# Patient Record
Sex: Female | Born: 1945
Health system: Southern US, Community
[De-identification: ages and names within clinical notes are randomized; demographics above are authoritative.]

## PROBLEM LIST (undated history)

## (undated) DIAGNOSIS — G5600 Carpal tunnel syndrome, unspecified upper limb: Secondary | ICD-10-CM

## (undated) DIAGNOSIS — I10 Essential (primary) hypertension: Secondary | ICD-10-CM

## (undated) DIAGNOSIS — M1990 Unspecified osteoarthritis, unspecified site: Secondary | ICD-10-CM

## (undated) DIAGNOSIS — K5900 Constipation, unspecified: Secondary | ICD-10-CM

## (undated) DIAGNOSIS — Z9889 Other specified postprocedural states: Secondary | ICD-10-CM

## (undated) DIAGNOSIS — R87612 Low grade squamous intraepithelial lesion on cytologic smear of cervix (LGSIL): Secondary | ICD-10-CM

## (undated) DIAGNOSIS — R112 Nausea with vomiting, unspecified: Secondary | ICD-10-CM

## (undated) DIAGNOSIS — M549 Dorsalgia, unspecified: Secondary | ICD-10-CM

## (undated) HISTORY — PX: BUNIONECTOMY: SHX129

## (undated) HISTORY — PX: BREAST CYST EXCISION: SHX579

## (undated) HISTORY — DX: Low grade squamous intraepithelial lesion on cytologic smear of cervix (LGSIL): R87.612

## (undated) HISTORY — PX: COLONOSCOPY: SHX174

## (undated) HISTORY — DX: Unspecified osteoarthritis, unspecified site: M19.90

## (undated) HISTORY — DX: Dorsalgia, unspecified: M54.9

## (undated) HISTORY — DX: Essential (primary) hypertension: I10

## (undated) HISTORY — PX: CARPAL TUNNEL RELEASE: SHX101

## (undated) HISTORY — DX: Carpal tunnel syndrome, unspecified upper limb: G56.00

---

## 2002-11-11 ENCOUNTER — Ambulatory Visit (HOSPITAL_COMMUNITY): Admission: RE | Admit: 2002-11-11 | Discharge: 2002-11-11 | Payer: Self-pay

## 2003-05-07 ENCOUNTER — Ambulatory Visit (HOSPITAL_COMMUNITY): Admission: RE | Admit: 2003-05-07 | Discharge: 2003-05-07 | Payer: Self-pay | Admitting: Family Medicine

## 2003-06-10 ENCOUNTER — Other Ambulatory Visit: Admission: RE | Admit: 2003-06-10 | Discharge: 2003-06-10 | Payer: Self-pay

## 2004-05-25 ENCOUNTER — Ambulatory Visit (HOSPITAL_COMMUNITY): Admission: RE | Admit: 2004-05-25 | Discharge: 2004-05-25 | Payer: Self-pay | Admitting: Family Medicine

## 2004-05-30 HISTORY — PX: ABDOMINAL HYSTERECTOMY: SHX81

## 2004-09-08 ENCOUNTER — Inpatient Hospital Stay (HOSPITAL_COMMUNITY): Admission: RE | Admit: 2004-09-08 | Discharge: 2004-09-10 | Payer: Self-pay | Admitting: Obstetrics & Gynecology

## 2005-09-13 ENCOUNTER — Ambulatory Visit (HOSPITAL_COMMUNITY): Admission: RE | Admit: 2005-09-13 | Discharge: 2005-09-13 | Payer: Self-pay | Admitting: Family Medicine

## 2006-12-19 ENCOUNTER — Ambulatory Visit (HOSPITAL_COMMUNITY): Admission: RE | Admit: 2006-12-19 | Discharge: 2006-12-19 | Payer: Self-pay | Admitting: Family Medicine

## 2010-05-25 ENCOUNTER — Emergency Department (HOSPITAL_COMMUNITY)
Admission: EM | Admit: 2010-05-25 | Discharge: 2010-05-25 | Payer: Self-pay | Source: Home / Self Care | Admitting: Emergency Medicine

## 2010-06-19 ENCOUNTER — Encounter: Payer: Self-pay | Admitting: Family Medicine

## 2010-08-09 LAB — URINE MICROSCOPIC-ADD ON

## 2010-08-09 LAB — URINALYSIS, ROUTINE W REFLEX MICROSCOPIC
Bilirubin Urine: NEGATIVE
Glucose, UA: NEGATIVE mg/dL
Ketones, ur: NEGATIVE mg/dL
Leukocytes, UA: NEGATIVE
Nitrite: NEGATIVE
Protein, ur: NEGATIVE mg/dL
Specific Gravity, Urine: >1.03 — ABNORMAL HIGH (ref 1.005–1.030)
Urobilinogen, UA: 0.2 mg/dL (ref 0.0–1.0)
pH: 5.5 (ref 5.0–8.0)

## 2010-10-15 NOTE — Op Note (Signed)
[name], Rachel [name]               ACCOUNT NO.:  [id number]   MEDICAL RECORD NO.:  [id number]          PATIENT TYPE:  AMB   LOCATION:  DAY                           FACILITY:  [facility]   PHYSICIAN:  Lazaro Arms, M.D.   DATE OF BIRTH:  [birth year]   DATE OF PROCEDURE:  09/08/2004  DATE OF DISCHARGE:                                 OPERATIVE REPORT   PREOPERATIVE DIAGNOSIS:  Persistent high grade dysplasia.   POSTOPERATIVE DIAGNOSIS:  Persistent high grade dysplasia.   OPERATION PERFORMED:  Total abdominal hysterectomy, bilateral salpingo-  oophorectomy.   SURGEON:  Lazaro Arms, M.D.   ANESTHESIA:  General endotracheal.   FINDINGS:  The patient had a normal uterus, tubes and ovaries, no peritoneal  abnormalities whatsoever.   DESCRIPTION OF PROCEDURE:  The patient was taken to the operating room and  placed in supine position where she underwent general endotracheal  anesthesia.  Her vagina was prepped.  Foley catheter was placed.  Her  abdomen was prepped in the usual sterile fashion and draped.  Pfannenstiel  skin incision was made and carried down sharply to the rectus fascia which  was scored in the midline and extended laterally.  The fascia was taken off  the muscle superiorly and inferiorly without difficulty.  The muscles were  divided and the peritoneal cavity was entered.  A bladder blade was placed.  The vesicouterine serosal flap was created.  The uterus was grasped at the  cornu.  The round ligaments were suture ligated and cut.  The  infundibulopelvic ligament was isolated, clamped, cut and suture ligated.  The uterine vessels were skeletonized bilaterally.  The right round ligament  was suture ligated and cut.  The infundibulopelvic ligaments on the right  were clamped, cut and transection suture ligated.  The uterine vessels were  clamped, cut and suture ligated.  The bladder was pushed off the lower  uterine segment.  Serial pedicle was taken through the  cardinal ligament,  each pedicle being clamped, cut and suture ligated.  The vaginal angle was  encountered.  It was cross-clamped.  The specimen was removed.  Vaginal  angle sutures were placed and the vagina was closed with interrupted figure-  of-eight sutures.  There was good hemostasis of all pedicles.  The  peritoneal cavity was vigorously irrigated.  Interceed was placed off the  vaginal cuff to prevent postoperative adhesions.  The protractor was  removed.  All the packs were removed.  The muscle was reapproximated  loosely.  The fascia closed  using 0 Vicryl.  Subcutaneous tissue was irrigated.  Skin was closed using  skin staples.  The patient tolerated the procedure well.  She experienced 50  cc of blood loss and was taken to recovery room in good and stable  condition.  All counts were correct.      LHE/MEDQ  D:  09/08/2004  T:  09/08/2004  Job:  [id number]

## 2011-08-09 ENCOUNTER — Ambulatory Visit (INDEPENDENT_AMBULATORY_CARE_PROVIDER_SITE_OTHER): Payer: Medicare Other | Admitting: Family Medicine

## 2011-08-09 ENCOUNTER — Encounter: Payer: Self-pay | Admitting: Family Medicine

## 2011-08-09 ENCOUNTER — Other Ambulatory Visit: Payer: Self-pay | Admitting: Family Medicine

## 2011-08-09 VITALS — BP 126/72 | HR 64 | Resp 18 | Ht 64.5 in | Wt 142.0 lb

## 2011-08-09 DIAGNOSIS — C801 Malignant (primary) neoplasm, unspecified: Secondary | ICD-10-CM

## 2011-08-09 DIAGNOSIS — Z139 Encounter for screening, unspecified: Secondary | ICD-10-CM

## 2011-08-09 DIAGNOSIS — E559 Vitamin D deficiency, unspecified: Secondary | ICD-10-CM | POA: Insufficient documentation

## 2011-08-09 DIAGNOSIS — M1990 Unspecified osteoarthritis, unspecified site: Secondary | ICD-10-CM

## 2011-08-09 DIAGNOSIS — Z13220 Encounter for screening for lipoid disorders: Secondary | ICD-10-CM

## 2011-08-09 DIAGNOSIS — I10 Essential (primary) hypertension: Secondary | ICD-10-CM

## 2011-08-09 MED ORDER — CALCIUM CARBONATE-VITAMIN D 500-400 MG-UNIT PO TABS: 1.0000 | ORAL_TABLET | Freq: Two times a day (BID) | ORAL | Status: AC

## 2011-08-09 MED ORDER — CELECOXIB 100 MG PO CAPS: 100.0000 mg | ORAL_CAPSULE | Freq: Every day | ORAL | Status: DC

## 2011-08-09 MED ORDER — QUINAPRIL-HYDROCHLOROTHIAZIDE 10-12.5 MG PO TABS: 1.0000 | ORAL_TABLET | Freq: Every day | ORAL | Status: DC

## 2011-08-09 NOTE — Patient Instructions (Signed)
It was nice to meet you! Please call back with the names and numbers of your physicians Continue your medications Please get your labwork done fasting- at least 2- 3 days before your next appointment F/U in 4 weeks

## 2011-08-11 ENCOUNTER — Encounter: Payer: Self-pay | Admitting: Family Medicine

## 2011-08-11 ENCOUNTER — Telehealth: Payer: Self-pay | Admitting: Family Medicine

## 2011-08-11 DIAGNOSIS — C801 Malignant (primary) neoplasm, unspecified: Secondary | ICD-10-CM | POA: Insufficient documentation

## 2011-08-11 DIAGNOSIS — M1990 Unspecified osteoarthritis, unspecified site: Secondary | ICD-10-CM | POA: Insufficient documentation

## 2011-08-11 NOTE — Telephone Encounter (Signed)
Pt stated that pharmacy offered her an otc oscal instead of prescription in which she was unable to afford.  She is asking for something that will be covered by either medicaid or medicare.

## 2011-08-11 NOTE — Assessment & Plan Note (Signed)
Check levels, in meantime start calcium Vit D

## 2011-08-11 NOTE — Assessment & Plan Note (Signed)
BP at goal no change to meds, labs obtained

## 2011-08-11 NOTE — Telephone Encounter (Signed)
Spoke with pt and sent appropriate medical records.

## 2011-08-11 NOTE — Assessment & Plan Note (Signed)
Trial of celebrex for joint pain

## 2011-08-11 NOTE — Assessment & Plan Note (Signed)
At this time it is unclear what type of female cancer she has, will obtain records and refer her to resume treatments as needed

## 2011-08-11 NOTE — Progress Notes (Signed)
  Subjective:    Patient ID: Rachel [name], female    DOB: [birth year], 66 y.o.   MRN: [id number]  HPI Pt here to establish care, previous PCP in [place]  HTN- on medication for past few years, BP has been well controlled  Cancer- currently being treated for vaginal cancer, ? If previously cervical s/p hysterectomy but she continued to have treatments done in [place]  Allergies- was seen by ENT in the past, has perisistent pressure in left ear and congestion, hearing is affected at times  Joint pain- asking for Celebrex, has tried Advil, Ibuprofen and Aleve, multiple joints have pain mostly hands   +Colonoscopy Due for Mammogram Review of Systems  GEN- denies fatigue, fever, weight loss,weakness, recent illness HEENT- denies eye drainage, change in vision, nasal discharge, CVS- denies chest pain, palpitations RESP- denies SOB, cough, wheeze ABD- denies N/V, change in stools, abd pain GU- denies dysuria, hematuria, dribbling, incontinence MSK- + joint pain, muscle aches, injury Neuro- denies headache, dizziness, syncope, seizure activity       Objective:   Physical Exam GEN- NAD, alert and oriented x3 HEENT- PERRL, EOMI, non injected sclera, pink conjunctiva, MMM, oropharynx clear, TM clar bilat, no effusion Neck- Supple, no thyromegaly, no carotid bruit CVS- RRR, no murmur RESP-CTAB ABD-NABS,soft, NT,ND EXT- No edema, minimal swelling hands at MIP, mild deformity of joints Pulses- Radial, DP- 2+        Assessment & Plan:

## 2011-08-15 ENCOUNTER — Ambulatory Visit (HOSPITAL_COMMUNITY): Payer: Medicare Other

## 2011-08-15 NOTE — Telephone Encounter (Signed)
Pt aware.

## 2011-08-15 NOTE — Telephone Encounter (Signed)
I spoke with pharmacy, Medicare is not covering any calicum supplements she will have to buy over the counter, she can try [facility] or [facility] for cheaper supplements

## 2011-09-06 ENCOUNTER — Ambulatory Visit: Payer: Medicare Other | Admitting: Family Medicine

## 2011-09-12 ENCOUNTER — Inpatient Hospital Stay (HOSPITAL_COMMUNITY): Admission: RE | Admit: 2011-09-12 | Payer: Medicare Other | Source: Ambulatory Visit

## 2011-09-13 ENCOUNTER — Ambulatory Visit: Payer: Medicare Other | Admitting: Family Medicine

## 2011-09-14 ENCOUNTER — Ambulatory Visit: Payer: Medicare Other | Admitting: Family Medicine

## 2011-09-16 ENCOUNTER — Ambulatory Visit (HOSPITAL_COMMUNITY)
Admission: RE | Admit: 2011-09-16 | Discharge: 2011-09-16 | Disposition: A | Discharge status: Home / Self Care | Payer: Medicare Other | Source: Ambulatory Visit | Attending: Family Medicine | Admitting: Family Medicine

## 2011-09-16 DIAGNOSIS — Z139 Encounter for screening, unspecified: Secondary | ICD-10-CM

## 2011-09-16 DIAGNOSIS — Z1231 Encounter for screening mammogram for malignant neoplasm of breast: Secondary | ICD-10-CM | POA: Insufficient documentation

## 2011-09-20 ENCOUNTER — Ambulatory Visit: Payer: Medicare Other | Admitting: Family Medicine

## 2011-09-20 LAB — CBC
HCT: 45.6 % (ref 36.0–46.0)
Hemoglobin: 15.1 g/dL — ABNORMAL HIGH (ref 12.0–15.0)
MCH: 28.7 pg (ref 26.0–34.0)
MCHC: 33.1 g/dL (ref 30.0–36.0)
MCV: 86.5 fL (ref 78.0–100.0)
Platelets: 173 10*3/uL (ref 150–400)
RBC: 5.27 MIL/uL — ABNORMAL HIGH (ref 3.87–5.11)
RDW: 15 % (ref 11.5–15.5)
WBC: 5.4 10*3/uL (ref 4.0–10.5)

## 2011-09-20 LAB — COMPREHENSIVE METABOLIC PANEL
ALT: 8 U/L (ref 0–35)
AST: 15 U/L (ref 0–37)
Albumin: 4.2 g/dL (ref 3.5–5.2)
Alkaline Phosphatase: 56 U/L (ref 39–117)
BUN: 20 mg/dL (ref 6–23)
CO2: 22 mEq/L (ref 19–32)
Calcium: 9.7 mg/dL (ref 8.4–10.5)
Chloride: 106 mEq/L (ref 96–112)
Creat: 0.79 mg/dL (ref 0.50–1.10)
Glucose, Bld: 76 mg/dL (ref 70–99)
Potassium: 4.2 mEq/L (ref 3.5–5.3)
Sodium: 138 mEq/L (ref 135–145)
Total Bilirubin: 0.4 mg/dL (ref 0.3–1.2)
Total Protein: 6.4 g/dL (ref 6.0–8.3)

## 2011-09-20 LAB — TSH: TSH: 0.831 u[IU]/mL (ref 0.350–4.500)

## 2011-09-22 LAB — VITAMIN D 1,25 DIHYDROXY
Vitamin D 1, 25 (OH)2 Total: 50 pg/mL (ref 18–72)
Vitamin D2 1, 25 (OH)2: <8 pg/mL
Vitamin D3 1, 25 (OH)2: 50 pg/mL

## 2011-10-03 ENCOUNTER — Ambulatory Visit (INDEPENDENT_AMBULATORY_CARE_PROVIDER_SITE_OTHER): Payer: Medicare Other | Admitting: Family Medicine

## 2011-10-03 ENCOUNTER — Encounter: Payer: Self-pay | Admitting: Family Medicine

## 2011-10-03 VITALS — BP 116/62 | HR 73 | Resp 18 | Ht 64.5 in | Wt 142.0 lb

## 2011-10-03 DIAGNOSIS — E559 Vitamin D deficiency, unspecified: Secondary | ICD-10-CM

## 2011-10-03 DIAGNOSIS — F419 Anxiety disorder, unspecified: Secondary | ICD-10-CM | POA: Insufficient documentation

## 2011-10-03 DIAGNOSIS — F17200 Nicotine dependence, unspecified, uncomplicated: Secondary | ICD-10-CM

## 2011-10-03 DIAGNOSIS — R251 Tremor, unspecified: Secondary | ICD-10-CM | POA: Insufficient documentation

## 2011-10-03 DIAGNOSIS — R259 Unspecified abnormal involuntary movements: Secondary | ICD-10-CM

## 2011-10-03 DIAGNOSIS — Z720 Tobacco use: Secondary | ICD-10-CM

## 2011-10-03 DIAGNOSIS — I10 Essential (primary) hypertension: Secondary | ICD-10-CM

## 2011-10-03 DIAGNOSIS — M1990 Unspecified osteoarthritis, unspecified site: Secondary | ICD-10-CM

## 2011-10-03 DIAGNOSIS — F411 Generalized anxiety disorder: Secondary | ICD-10-CM

## 2011-10-03 DIAGNOSIS — Z87891 Personal history of nicotine dependence: Secondary | ICD-10-CM | POA: Insufficient documentation

## 2011-10-03 MED ORDER — BUSPIRONE HCL 10 MG PO TABS: 10.0000 mg | ORAL_TABLET | Freq: Two times a day (BID) | ORAL | Status: DC

## 2011-10-03 NOTE — Assessment & Plan Note (Signed)
Her tremor seems to coincide with her mood and she has had in the past, though her early morning symptoms do not fit with this diagnosis of anxiety. Will give trial on buspar, if no improvement in tremor consider neurology referral

## 2011-10-03 NOTE — Assessment & Plan Note (Signed)
BP at goal, no change to meds , labs reviewed

## 2011-10-03 NOTE — Progress Notes (Signed)
  Subjective:    Patient ID: Rachel [name], female    DOB: 1945-08-31, 66 y.o.   MRN: [id number]  HPI   Pt here to f/u HTN and labs.  Tolerating BP meds Ortho and PCP records received but no GYN records Stress- she feels very stressed and anxious at times, she noticed a tremor in her hands which is worse in the morning, she is also biting her mouth in her sleep and gnawing when she gets stressed. She had the same symptoms of tremor and anxiety a few years ago treated by her PCP she was on Zoloft and tried on a few other meds which did help but she can not remember which one in particular.     Review of Systems   GEN- denies fatigue, fever, weight loss,weakness, recent illness HEENT- denies eye drainage, change in vision, nasal discharge, CVS- denies chest pain, palpitations RESP- denies SOB, cough, wheeze ABD- denies N/V, change in stools, abd pain GU- denies dysuria, hematuria, dribbling, incontinence MSK- + joint pain, muscle aches, injury Neuro- denies headache, dizziness, syncope, seizure activity      Objective:   Physical Exam GEN- NAD, alert and oriented x3 HEENT- PERRL, EOMI, non injected sclera, pink conjunctiva, MMM, oropharynx clear Neck- Supple,  CVS- RRR, no murmur RESP-CTAB EXT- No edema Pulses- Radial, DP- 2+ Neuro-CNII-XII in tact, no nystagmus, DTR symmetric bilat, minimal tremor with outstretched hands in left hand only, no focal deficits Psych-not overly anxious or depressed, good eye contact, normal speech      Assessment & Plan:

## 2011-10-03 NOTE — Assessment & Plan Note (Signed)
Pain controlled with celebrex

## 2011-10-03 NOTE — Assessment & Plan Note (Signed)
Resolved, OTC vita d replacement needed

## 2011-10-03 NOTE — Assessment & Plan Note (Signed)
Trial of buspar, for anxiety

## 2011-10-03 NOTE — Patient Instructions (Signed)
Start taking the buspar twice a day for your anxiety and nerves I will call Dr. Excell Seltzer to see what treatments you need Calcium 1200mg , Vit D ( 800IU)  Your labs look good Continue to work on the smoking  F/U in 3 weeks

## 2011-10-03 NOTE — Assessment & Plan Note (Signed)
Discussed importance of smoking cessation. 

## 2011-10-19 ENCOUNTER — Telehealth: Payer: Self-pay | Admitting: Family Medicine

## 2011-10-19 DIAGNOSIS — B977 Papillomavirus as the cause of diseases classified elsewhere: Secondary | ICD-10-CM

## 2011-10-19 DIAGNOSIS — IMO0002 Reserved for concepts with insufficient information to code with codable children: Secondary | ICD-10-CM

## 2011-10-19 NOTE — Telephone Encounter (Signed)
I spoke with pt, I have her GYN records, she has LSIL on PAP Smears before her hysterctomy, since then continues to have HPV positive PAP smears on what appears to be vaginal cuff, by GYN in [place].Will refer her to GYn to have this looked at and to determine if she needs further work-up or can stop exams. She has not felt  Much difference with the buspar the past 2 weeks, she will continue another few weeks and call if no change in anxiety and we will decide if medication needs to be changed

## 2011-12-22 ENCOUNTER — Telehealth: Payer: Self-pay | Admitting: Family Medicine

## 2011-12-22 NOTE — Telephone Encounter (Signed)
Called and left message for pt to return call.  

## 2012-01-12 ENCOUNTER — Emergency Department (HOSPITAL_COMMUNITY): Payer: Medicare Other

## 2012-01-12 ENCOUNTER — Encounter (HOSPITAL_COMMUNITY): Payer: Self-pay | Admitting: *Deleted

## 2012-01-12 ENCOUNTER — Emergency Department (HOSPITAL_COMMUNITY)
Admission: EM | Admit: 2012-01-12 | Discharge: 2012-01-12 | Disposition: A | Discharge status: Home / Self Care | Payer: Medicare Other | Attending: Emergency Medicine | Admitting: Emergency Medicine

## 2012-01-12 DIAGNOSIS — F17200 Nicotine dependence, unspecified, uncomplicated: Secondary | ICD-10-CM | POA: Insufficient documentation

## 2012-01-12 DIAGNOSIS — M79609 Pain in unspecified limb: Secondary | ICD-10-CM | POA: Insufficient documentation

## 2012-01-12 DIAGNOSIS — Z833 Family history of diabetes mellitus: Secondary | ICD-10-CM | POA: Insufficient documentation

## 2012-01-12 DIAGNOSIS — Z809 Family history of malignant neoplasm, unspecified: Secondary | ICD-10-CM | POA: Insufficient documentation

## 2012-01-12 DIAGNOSIS — M79671 Pain in right foot: Secondary | ICD-10-CM

## 2012-01-12 DIAGNOSIS — Z90710 Acquired absence of both cervix and uterus: Secondary | ICD-10-CM | POA: Insufficient documentation

## 2012-01-12 DIAGNOSIS — I10 Essential (primary) hypertension: Secondary | ICD-10-CM | POA: Insufficient documentation

## 2012-01-12 DIAGNOSIS — M1990 Unspecified osteoarthritis, unspecified site: Secondary | ICD-10-CM | POA: Insufficient documentation

## 2012-01-12 DIAGNOSIS — Z8489 Family history of other specified conditions: Secondary | ICD-10-CM | POA: Insufficient documentation

## 2012-01-12 DIAGNOSIS — G5600 Carpal tunnel syndrome, unspecified upper limb: Secondary | ICD-10-CM | POA: Insufficient documentation

## 2012-01-12 NOTE — Discharge Instructions (Signed)
Cryotherapy Cryotherapy means treatment with cold. Ice or gel packs can be used to reduce both pain and swelling. Ice is the most helpful within the first 24 to 48 hours after an injury or flareup from overusing a muscle or joint. Sprains, strains, spasms, burning pain, shooting pain, and aches can all be eased with ice. Ice can also be used when recovering from surgery. Ice is effective, has very few side effects, and is safe for most people to use. PRECAUTIONS  Ice is not a safe treatment option for people with:  Raynaud's phenomenon. This is a condition affecting small blood vessels in the extremities. Exposure to cold may cause your problems to return.   Cold hypersensitivity. There are many forms of cold hypersensitivity, including:   Cold urticaria. Red, itchy hives appear on the skin when the tissues begin to warm after being iced.   Cold erythema. This is a red, itchy rash caused by exposure to cold.   Cold hemoglobinuria. Red blood cells break down when the tissues begin to warm after being iced. The hemoglobin that carry oxygen are passed into the urine because they cannot combine with blood proteins fast enough.   Numbness or altered sensitivity in the area being iced.  If you have any of the following conditions, do not use ice until you have discussed cryotherapy with your caregiver:  Heart conditions, such as arrhythmia, angina, or chronic heart disease.   High blood pressure.   Healing wounds or open skin in the area being iced.   Current infections.   Rheumatoid arthritis.   Poor circulation.   Diabetes.  Ice slows the blood flow in the region it is applied. This is beneficial when trying to stop inflamed tissues from spreading irritating chemicals to surrounding tissues. However, if you expose your skin to cold temperatures for too long or without the proper protection, you can damage your skin or nerves. Watch for signs of skin damage due to cold. HOME CARE  INSTRUCTIONS Follow these tips to use ice and cold packs safely.  Place a dry or damp towel between the ice and skin. A damp towel will cool the skin more quickly, so you may need to shorten the time that the ice is used.   For a more rapid response, add gentle compression to the ice.   Ice for no more than 10 to 20 minutes at a time. The bonier the area you are icing, the less time it will take to get the benefits of ice.   Check your skin after 5 minutes to make sure there are no signs of a poor response to cold or skin damage.   Rest 20 minutes or more in between uses.   Once your skin is numb, you can end your treatment. You can test numbness by very lightly touching your skin. The touch should be so light that you do not see the skin dimple from the pressure of your fingertip. When using ice, most people will feel these normal sensations in this order: cold, burning, aching, and numbness.   Do not use ice on someone who cannot communicate their responses to pain, such as small children or people with dementia.  HOW TO MAKE AN ICE PACK Ice packs are the most common way to use ice therapy. Other methods include ice massage, ice baths, and cryo-sprays. Muscle creams that cause a cold, tingly feeling do not offer the same benefits that ice offers and should not be used as a substitute  unless recommended by your caregiver. To make an ice pack, do one of the following:  Place crushed ice or a bag of frozen vegetables in a sealable plastic bag. Squeeze out the excess air. Place this bag inside another plastic bag. Slide the bag into a pillowcase or place a damp towel between your skin and the bag.   Mix 3 parts water with 1 part rubbing alcohol. Freeze the mixture in a sealable plastic bag. When you remove the mixture from the freezer, it will be slushy. Squeeze out the excess air. Place this bag inside another plastic bag. Slide the bag into a pillowcase or place a damp towel between your skin  and the bag.  SEEK MEDICAL CARE IF:  You develop white spots on your skin. This may give the skin a blotchy (mottled) appearance.   Your skin turns blue or pale.   Your skin becomes waxy or hard.   Your swelling gets worse.  MAKE SURE YOU:   Understand these instructions.   Will watch your condition.   Will get help right away if you are not doing well or get worse.  Document Released: 01/10/2011 Document Revised: 05/05/2011 Document Reviewed: 01/10/2011 [facility] Patient Information 2012 [place], [place].   The x-rays show no evidence of acute bony abnormalities.  Wear the ace wrap for comfort and to reduce swelling.  Apply ice several times daily and elevate foot as much as possible.  Use the crutches as needed  And bear weight as tolerated.  Take aleve or ibuprofen regularly with food.  Follow up with dr. Jeanice Lim as needed.

## 2012-01-12 NOTE — ED Provider Notes (Signed)
History     CSN: [id number]  Arrival date & time 01/12/12  1531   First MD Initiated Contact with Patient 01/12/12 1645      Chief Complaint  Patient presents with  . Foot Pain    (Consider location/radiation/quality/duration/timing/severity/associated sxs/prior treatment) HPI Comments: Does not recall any injury.  Awakened this AM with R dorsal foot pain and swelling.  Pain with weight bearing.  Took aleve this AM.  Patient is a 66 y.o. female presenting with lower extremity pain. The history is provided by the patient. No language interpreter was used.  Foot Pain This is a new problem. The current episode started today. The problem occurs constantly. The problem has been unchanged. Pertinent negatives include no fever, numbness or weakness. The symptoms are aggravated by walking and standing. She has tried NSAIDs for the symptoms. The treatment provided no relief.    Past Medical History  Diagnosis Date  . Hypertension   . OA (osteoarthritis)   . Carpal tunnel syndrome   . Back pain     Past Surgical History  Procedure Date  . Abdominal hysterectomy 2006  . Breast cyst excision     right  . Carpal tunnel release     Family History  Problem Relation Age of Onset  . Hyperlipidemia Father   . Diabetes Father   . Cancer Father   . Cancer Sister     History  Substance Use Topics  . Smoking status: Current Everyday Smoker -- 0.5 packs/day    Types: Cigarettes  . Smokeless tobacco: Not on file  . Alcohol Use: Yes     occ    OB History    Grav Para Term Preterm Abortions TAB SAB Ect Mult Living                  Review of Systems  Constitutional: Negative for fever.  Musculoskeletal:       Foot pain   Neurological: Negative for weakness and numbness.  All other systems reviewed and are negative.    Allergies  Review of patient's allergies indicates no known allergies.  Home Medications   Current Outpatient Rx  Name Route Sig Dispense Refill  .  CALCIUM CARBONATE-VITAMIN D 500-400 MG-UNIT PO TABS Oral Take 1 tablet by mouth 2 (two) times daily. 60 tablet 3  . CELECOXIB 100 MG PO CAPS Oral Take 1 capsule (100 mg total) by mouth daily. 30 capsule 3  . NAPROXEN SODIUM 220 MG PO TABS Oral Take 220-440 mg by mouth as needed. For pain    . QUINAPRIL-HYDROCHLOROTHIAZIDE 10-12.5 MG PO TABS Oral Take 1 tablet by mouth daily. 30 tablet 3    BP 159/84  Pulse 66  Temp 98.5 F (36.9 C) (Oral)  Resp 18  Ht 5' 4.5" (1.638 m)  Wt 142 lb (64.411 kg)  BMI 24.00 kg/m2  SpO2 100%  Physical Exam  Nursing note and vitals reviewed. Constitutional: She is oriented to person, place, and time. She appears well-developed and well-nourished. No distress.  HENT:  Head: Normocephalic and atraumatic.  Eyes: EOM are normal.  Neck: Normal range of motion.  Cardiovascular: Normal rate, regular rhythm and normal heart sounds.   Pulmonary/Chest: Effort normal and breath sounds normal.  Abdominal: Soft. She exhibits no distension. There is no tenderness.  Musculoskeletal: She exhibits tenderness.       Right foot: She exhibits decreased range of motion, tenderness, bony tenderness and swelling. She exhibits normal capillary refill, no crepitus, no deformity and no  laceration.       Feet:  Neurological: She is alert and oriented to person, place, and time.  Skin: Skin is warm and dry.  Psychiatric: She has a normal mood and affect. Judgment normal.    ED Course  Procedures (including critical care time)  Labs Reviewed - No data to display Dg Foot Complete Right  01/12/2012  *RADIOLOGY REPORT*  Clinical Data: Pain and swelling across the top of the foot.  RIGHT FOOT COMPLETE - 3+ VIEW  Comparison: None.  Findings: No acute bony or joint abnormality is identified.  Small exostosis off the distal talus is noted.  Soft tissues unremarkable.  IMPRESSION: Negative exam.  Original Report Authenticated By: Bernadene Bell. D'ALESSIO, M.D.     1. Right foot pain        MDM  Ace wrap Crutches, ice elevation. NSAID F/u with PCP prn        Evalina Field, PA 01/12/12 1902

## 2012-01-12 NOTE — ED Notes (Signed)
Pt states she woke up Tuesday morning with her right foot swollen. Pt states it has been painful to bear weight since that morning. No deformity or swelling noted on exam.

## 2012-01-12 NOTE — ED Notes (Signed)
Rt foot pain and swelling, no known injury

## 2012-01-13 NOTE — ED Provider Notes (Signed)
Medical screening examination/treatment/procedure(s) were performed by non-physician practitioner and as supervising physician I was immediately available for consultation/collaboration. Hayward Rylander, MD, FACEP   Raychelle Hudman L Jesus Poplin, MD 01/13/12 0121 

## 2012-01-31 ENCOUNTER — Other Ambulatory Visit: Payer: Self-pay | Admitting: Family Medicine

## 2012-01-31 ENCOUNTER — Other Ambulatory Visit: Payer: Self-pay

## 2012-01-31 MED ORDER — QUINAPRIL-HYDROCHLOROTHIAZIDE 10-12.5 MG PO TABS: 1.0000 | ORAL_TABLET | Freq: Every day | ORAL | Status: DC

## 2012-01-31 MED ORDER — CELECOXIB 100 MG PO CAPS: 100.0000 mg | ORAL_CAPSULE | Freq: Every day | ORAL | Status: DC

## 2012-02-17 ENCOUNTER — Encounter: Payer: Self-pay | Admitting: Family Medicine

## 2012-02-17 ENCOUNTER — Ambulatory Visit (INDEPENDENT_AMBULATORY_CARE_PROVIDER_SITE_OTHER): Payer: Medicare Other | Admitting: Family Medicine

## 2012-02-17 VITALS — BP 112/80 | HR 80 | Resp 15 | Ht 64.5 in | Wt 139.0 lb

## 2012-02-17 DIAGNOSIS — Z23 Encounter for immunization: Secondary | ICD-10-CM

## 2012-02-17 DIAGNOSIS — Z720 Tobacco use: Secondary | ICD-10-CM

## 2012-02-17 DIAGNOSIS — K219 Gastro-esophageal reflux disease without esophagitis: Secondary | ICD-10-CM

## 2012-02-17 DIAGNOSIS — R259 Unspecified abnormal involuntary movements: Secondary | ICD-10-CM

## 2012-02-17 DIAGNOSIS — R251 Tremor, unspecified: Secondary | ICD-10-CM

## 2012-02-17 DIAGNOSIS — M1990 Unspecified osteoarthritis, unspecified site: Secondary | ICD-10-CM

## 2012-02-17 DIAGNOSIS — G5690 Unspecified mononeuropathy of unspecified upper limb: Secondary | ICD-10-CM

## 2012-02-17 DIAGNOSIS — I10 Essential (primary) hypertension: Secondary | ICD-10-CM

## 2012-02-17 DIAGNOSIS — F17200 Nicotine dependence, unspecified, uncomplicated: Secondary | ICD-10-CM

## 2012-02-17 DIAGNOSIS — L659 Nonscarring hair loss, unspecified: Secondary | ICD-10-CM

## 2012-02-17 MED ORDER — OMEPRAZOLE 20 MG PO CPDR: 20.0000 mg | DELAYED_RELEASE_CAPSULE | Freq: Every day | ORAL | Status: DC

## 2012-02-17 NOTE — Patient Instructions (Addendum)
Referral to Neurology for the arm  Try your braces at bedtime  Start the acid blocking medication Get the cholesterol lab done  Pneumonia and flu vaccine  Call with the prescription for your hair  F/U 4 months

## 2012-02-19 ENCOUNTER — Encounter: Payer: Self-pay | Admitting: Family Medicine

## 2012-02-19 DIAGNOSIS — L659 Nonscarring hair loss, unspecified: Secondary | ICD-10-CM | POA: Insufficient documentation

## 2012-02-19 DIAGNOSIS — K219 Gastro-esophageal reflux disease without esophagitis: Secondary | ICD-10-CM | POA: Insufficient documentation

## 2012-02-19 DIAGNOSIS — G5690 Unspecified mononeuropathy of unspecified upper limb: Secondary | ICD-10-CM | POA: Insufficient documentation

## 2012-02-19 NOTE — Assessment & Plan Note (Signed)
PPI trial, discussed foods to avoid, upright 30 minutes after meals, quit smoking

## 2012-02-19 NOTE — Assessment & Plan Note (Signed)
In setting of GERD, will not change NSAID

## 2012-02-19 NOTE — Assessment & Plan Note (Signed)
Well controlled 

## 2012-02-19 NOTE — Assessment & Plan Note (Signed)
This with new parethesia, refer to neurology for evaluation

## 2012-02-19 NOTE — Assessment & Plan Note (Signed)
unchanged

## 2012-02-19 NOTE — Progress Notes (Signed)
  Subjective:    Patient ID: Rachel [name], female    DOB: May 17, 1946, 66 y.o.   MRN: [id number]  HPI Pt presents with multiple concerns, note she did not follow-up from last visit. She did not take the buspar for her anxiety . Joint pain- increased joint pain on celebrex wanted to know if this could be increased ABD pain- has been having burning sensation in stomach with reflux, worse past 2 weeks has tried tums and zantac which helped some, no diarrhea, no fever, no n/v.  Arm numbness- feels tingling and numbness in fingterips and hand on right side, occasionally has pain that shoots up arm, similar to previous carpal tunnel before surgery, has been presents past few months, no injury, has to shake hands at times.  Alopecia- diagnosed many years ago, is on nizoral and a cream but she ran out, asking for any treatments, her hair continues to fall out Knot on neck- after visit concluded I was called back to room because pt forgot to tell me she thought she felt a knot on both sides of neck, no difficult breathing, no dysphagia, no swelling of neck   Review of Systems - per above  GEN- denies fatigue, fever, weight loss,weakness, recent illness HEENT- denies eye drainage, change in vision, nasal discharge, CVS- denies chest pain, palpitations RESP- denies SOB, cough, wheeze ABD- denies N/V, change in stools, +abd pain GU- denies dysuria, hematuria, dribbling, incontinence MSK- + joint pain, muscle aches, injury Neuro- denies headache, dizziness, syncope, seizure activity      Objective:   Physical Exam  GEN- NAD, alert and oriented x3 HEENT- PERRL, EOMI, non injected sclera, pink conjunctiva, MMM, oropharynx clear Neck- Supple, ROM, thyroid felt no discrete nodule, no LAD CVS- RRR, no murmur RESP-CTAB ABD-NABS,soft,NT,ND EXT- No edema Pulses- Radial, DP- 2+ Neuro- + tinels, DTR symmetric bilat upper ext, strength equal bilat upper ext, normal muscle tone and ROM Scalp- multiple  areas of alopecia and thinning hair, no rash        Assessment & Plan:

## 2012-02-19 NOTE — Assessment & Plan Note (Signed)
She is s/p remote surgery with recurrent symptoms, refer to neurology for work-up- nerve conduction studies, return to use of braces for now

## 2012-02-19 NOTE — Assessment & Plan Note (Addendum)
Seen by dermatology in the past. Has a cream at home, will call back to have this refilled Advised she would need to be seen by derm for further treatment such as injections into scalp, she declined

## 2012-02-21 ENCOUNTER — Telehealth: Payer: Self-pay | Admitting: Family Medicine

## 2012-02-21 NOTE — Telephone Encounter (Signed)
Patient aware that she should be taking med once daily.

## 2012-03-06 ENCOUNTER — Telehealth: Payer: Self-pay | Admitting: Family Medicine

## 2012-03-06 NOTE — Telephone Encounter (Signed)
Noted, please send pt a letter stating neurology office- Dr. Gerilyn Pilgrim has tried to contact her for appointments and have not been able to do so. Please have her call there office directly to set up appointment- give number in letter

## 2012-03-14 NOTE — Telephone Encounter (Signed)
Letter sent.

## 2012-05-01 ENCOUNTER — Ambulatory Visit (INDEPENDENT_AMBULATORY_CARE_PROVIDER_SITE_OTHER): Payer: Medicare Other | Admitting: Family Medicine

## 2012-05-01 ENCOUNTER — Ambulatory Visit: Payer: Medicare Other | Admitting: Family Medicine

## 2012-05-01 ENCOUNTER — Encounter: Payer: Self-pay | Admitting: Family Medicine

## 2012-05-01 VITALS — BP 126/88 | HR 58 | Temp 98.1°F | Resp 18 | Wt 136.1 lb

## 2012-05-01 DIAGNOSIS — R109 Unspecified abdominal pain: Secondary | ICD-10-CM

## 2012-05-01 DIAGNOSIS — I10 Essential (primary) hypertension: Secondary | ICD-10-CM

## 2012-05-01 DIAGNOSIS — K219 Gastro-esophageal reflux disease without esophagitis: Secondary | ICD-10-CM

## 2012-05-01 DIAGNOSIS — J019 Acute sinusitis, unspecified: Secondary | ICD-10-CM

## 2012-05-01 MED ORDER — AMOXICILLIN 500 MG PO CAPS: 500.0000 mg | ORAL_CAPSULE | Freq: Two times a day (BID) | ORAL | Status: AC

## 2012-05-01 MED ORDER — QUINAPRIL-HYDROCHLOROTHIAZIDE 10-12.5 MG PO TABS: 1.0000 | ORAL_TABLET | Freq: Every day | ORAL | Status: DC

## 2012-05-01 NOTE — Assessment & Plan Note (Signed)
Antibiotics, nasal saline and sudafed x 3 days

## 2012-05-01 NOTE — Assessment & Plan Note (Signed)
PPI did not help pt, feels like stomach is "raw", send to GI for possible EGD,

## 2012-05-01 NOTE — Assessment & Plan Note (Addendum)
Epigastric discomfort, obtain CBC, CMET, Lipase, doubt pancreatitis, does not sound like gallbladder colic refer to GI

## 2012-05-01 NOTE — Patient Instructions (Addendum)
Sudafed for 3 days, ask for non drowsy  Antibiotics  Nasal saline  Get the bloodwork done next week- fasting  Referral to GI for stomach  Change f/u appt to March 2014

## 2012-05-01 NOTE — Assessment & Plan Note (Signed)
Well controlled 

## 2012-05-01 NOTE — Progress Notes (Signed)
  Subjective:    Patient ID: Rachel [name], female    DOB: October 29, 1945, 66 y.o.   MRN: [id number]  HPI  Patient presents with upper respiratory symptoms and sinus drainage and congestion for the past 3 weeks. She's been trying NyQuil Alka-Seltzer at home but not improving. She states her sinuses weren't indicated very clogged up. She also has a dry cough or she feels like the drainage is getting caught in her throat. She denies any fever or shortness of breath. Positive sick contact with her grandson who had a viral illness. She also admits to a fullness in her ears At her last visit she complained of epigastric discomfort with acid reflux and feeling like her stomach is raw when she eats, has burning sensation. She denies any nausea vomiting. She did start the Prilosec however this did not help. She feels like she is getting symptoms more often at this point.  Review of Systems  GEN- denies fatigue, fever, weight loss,weakness, recent illness HEENT- denies eye drainage, change in vision,+ nasal discharge, CVS- denies chest pain, palpitations RESP- denies SOB, +cough, wheeze ABD- denies N/V, change in stools,+ abd pain GU- denies dysuria, hematuria, dribbling, incontinence MSK- denies joint pain, muscle aches, injury Neuro- denies headache, dizziness, syncope, seizure activity      Objective:   Physical Exam  GEN- NAD, alert and oriented x3,weight down 3 lbs HEENT- PERRL, EOMI, non injected sclera, pink conjunctiva, MMM, oropharynx mild injection, TM clear bilat no effusion, + maxillary sinus tenderness, no  Nasal drainage  Neck- Supple, no LAD CVS- RRR, no murmur RESP-CTAB ABS-NABS,soft,NT,ND EXT- No edema Pulses- Radial 2+        Assessment & Plan:

## 2012-05-25 ENCOUNTER — Telehealth: Payer: Self-pay | Admitting: Family Medicine

## 2012-05-28 NOTE — Telephone Encounter (Signed)
Called patient and left message for them to return call at the office   

## 2012-06-07 ENCOUNTER — Ambulatory Visit (INDEPENDENT_AMBULATORY_CARE_PROVIDER_SITE_OTHER): Payer: Medicare Other | Admitting: Internal Medicine

## 2012-06-07 ENCOUNTER — Encounter (INDEPENDENT_AMBULATORY_CARE_PROVIDER_SITE_OTHER): Payer: Self-pay | Admitting: Internal Medicine

## 2012-06-07 ENCOUNTER — Encounter (INDEPENDENT_AMBULATORY_CARE_PROVIDER_SITE_OTHER): Payer: Self-pay | Admitting: *Deleted

## 2012-06-07 ENCOUNTER — Other Ambulatory Visit (INDEPENDENT_AMBULATORY_CARE_PROVIDER_SITE_OTHER): Payer: Self-pay | Admitting: *Deleted

## 2012-06-07 VITALS — BP 106/74 | HR 76 | Temp 98.6°F | Ht 64.5 in | Wt 136.2 lb

## 2012-06-07 DIAGNOSIS — K219 Gastro-esophageal reflux disease without esophagitis: Secondary | ICD-10-CM

## 2012-06-07 MED ORDER — PANTOPRAZOLE SODIUM 40 MG PO TBEC: 40.0000 mg | DELAYED_RELEASE_TABLET | Freq: Every day | ORAL | Status: DC

## 2012-06-07 NOTE — Patient Instructions (Addendum)
EGD

## 2012-06-07 NOTE — Progress Notes (Signed)
Subjective:     Patient ID: Rachel [name], female   DOB: 09/30/1945, 67 y.o.   MRN: [id number]  HPIReferred to our office for burning epigastric pain. She also has some nausea. Sometimes it feels like the walls of her are on fire. Symptoms x 3-4 months. She denies having acid reflux before this.  Some foods such as spaghetti will cause her stomach to burn. She was given Prilosec by Dr. Jeanice Lim but did not notice any difference. She is having acid reflux practically all the time.There has been no weight loss. Appetite is good. No weight loss. She has a BM about one every other day after taking a couple of stool softners.   Any use of NSAIDs: Celebrex Any recent travel: Any melena or bright red rectal bleeding: no Any recent antibiotics:  In November for a sinus infection Any etoh abuse No Any tattoos: No Any IV drug use: No   Review of Systems see hpi Current Outpatient Prescriptions  Medication Sig Dispense Refill  . calcium-vitamin D (OSCAL-500) 500-400 MG-UNIT per tablet Take 1 tablet by mouth 2 (two) times daily.  60 tablet  3  . celecoxib (CELEBREX) 100 MG capsule Take 1 capsule (100 mg total) by mouth daily.  30 capsule  3  . naproxen sodium (ALEVE) 220 MG tablet Take 220-440 mg by mouth as needed. For pain      . pseudoephedrine (SUDAFED) 30 MG tablet Take 30 mg by mouth every 4 (four) hours as needed.      . quinapril-hydrochlorothiazide (ACCURETIC) 10-12.5 MG per tablet Take 1 tablet by mouth daily.  30 tablet  3   Past Medical History  Diagnosis Date  . Hypertension   . OA (osteoarthritis)   . Carpal tunnel syndrome   . Back pain    Past Surgical History  Procedure Date  . Abdominal hysterectomy 2006  . Breast cyst excision     right  . Carpal tunnel release    No Known Allergies      Objective:   Physical Exam Filed Vitals:   06/07/12 1506  BP: 106/74  Pulse: 76  Temp: 98.6 F (37 C)  Height: 5' 4.5" (1.638 m)  Weight: 136 lb 3.2 oz (61.78 kg)   Alert  and oriented. Skin warm and dry. Oral mucosa is moist.   . Sclera anicteric, conjunctivae is pink. Thyroid not enlarged. No cervical lymphadenopathy. Lungs clear. Heart regular rate and rhythm.  Abdomen is soft. Bowel sounds are positive. No hepatomegaly. No abdominal masses felt. No tenderness.  No edema to lower extremities. Patient is alert and oriented.      Assessment:   Uncontrolled GERD at this time. She is not a a PPI at present.  PUD needs to be ruled out.    Plan:   Stop the Celebrex. Rx for Protonix called to her pharmacy. EGD

## 2012-06-11 ENCOUNTER — Encounter (HOSPITAL_COMMUNITY): Payer: Self-pay | Admitting: Pharmacy Technician

## 2012-06-13 ENCOUNTER — Encounter (HOSPITAL_COMMUNITY): Payer: Self-pay | Admitting: *Deleted

## 2012-06-13 ENCOUNTER — Encounter (HOSPITAL_COMMUNITY)
Admission: RE | Disposition: A | Discharge status: Home / Self Care | Payer: Self-pay | Source: Ambulatory Visit | Attending: Internal Medicine

## 2012-06-13 ENCOUNTER — Ambulatory Visit (HOSPITAL_COMMUNITY)
Admission: RE | Admit: 2012-06-13 | Discharge: 2012-06-13 | Disposition: A | Discharge status: Home / Self Care | Payer: Medicare Other | Source: Ambulatory Visit | Attending: Internal Medicine | Admitting: Internal Medicine

## 2012-06-13 DIAGNOSIS — K219 Gastro-esophageal reflux disease without esophagitis: Secondary | ICD-10-CM

## 2012-06-13 DIAGNOSIS — K208 Other esophagitis without bleeding: Secondary | ICD-10-CM

## 2012-06-13 DIAGNOSIS — K319 Disease of stomach and duodenum, unspecified: Secondary | ICD-10-CM | POA: Insufficient documentation

## 2012-06-13 DIAGNOSIS — R1013 Epigastric pain: Secondary | ICD-10-CM | POA: Insufficient documentation

## 2012-06-13 DIAGNOSIS — K2900 Acute gastritis without bleeding: Secondary | ICD-10-CM

## 2012-06-13 DIAGNOSIS — K2100 Gastro-esophageal reflux disease with esophagitis, without bleeding: Secondary | ICD-10-CM | POA: Insufficient documentation

## 2012-06-13 DIAGNOSIS — R110 Nausea: Secondary | ICD-10-CM

## 2012-06-13 DIAGNOSIS — K449 Diaphragmatic hernia without obstruction or gangrene: Secondary | ICD-10-CM | POA: Insufficient documentation

## 2012-06-13 HISTORY — PX: ESOPHAGOGASTRODUODENOSCOPY: SHX5428

## 2012-06-13 SURGERY — EGD (ESOPHAGOGASTRODUODENOSCOPY)
Anesthesia: Moderate Sedation

## 2012-06-13 MED ORDER — MEPERIDINE HCL 50 MG/ML IJ SOLN
INTRAMUSCULAR | Status: AC
  Filled 2012-06-13: qty 1

## 2012-06-13 MED ORDER — MIDAZOLAM HCL 5 MG/5ML IJ SOLN
INTRAMUSCULAR | Status: AC
  Filled 2012-06-13: qty 10

## 2012-06-13 MED ORDER — MIDAZOLAM HCL 5 MG/5ML IJ SOLN
INTRAMUSCULAR | Status: DC | PRN
  Administered 2012-06-13 (×2): 2 mg via INTRAVENOUS
  Administered 2012-06-13: 1 mg via INTRAVENOUS

## 2012-06-13 MED ORDER — MEPERIDINE HCL 25 MG/ML IJ SOLN
INTRAMUSCULAR | Status: DC | PRN
  Administered 2012-06-13 (×2): 25 mg via INTRAVENOUS

## 2012-06-13 MED ORDER — SODIUM CHLORIDE 0.45 % IV SOLN
INTRAVENOUS | Status: DC
  Administered 2012-06-13: 20 mL/h via INTRAVENOUS

## 2012-06-13 MED ORDER — BUTAMBEN-TETRACAINE-BENZOCAINE 2-2-14 % EX AERO
INHALATION_SPRAY | CUTANEOUS | Status: DC | PRN
  Administered 2012-06-13: 2 via TOPICAL

## 2012-06-13 NOTE — Op Note (Signed)
EGD PROCEDURE REPORT  PATIENT:  Rachel [name]  MR#:  [id number] Birthdate:  [birth year], 67 y.o., female Endoscopist:  Dr. Malissa Hippo, MD Referred By:  Dr. Milinda Antis, MD Procedure Date: 06/13/2012  Procedure:   EGD  Indications:  Patient is 67 year old African female who presents with 4 month history of epigastric pain nausea and intermittent heartburn. She has been on NSAIDs for 2 years for osteoarthrosis. She is not feeling much better with pantoprazole which was begun last week. She is undergoing diagnostic EGD.            Informed Consent:  The risks, benefits, alternatives & imponderables which include, but are not limited to, bleeding, infection, perforation, drug reaction and potential missed lesion have been reviewed.  The potential for biopsy, lesion removal, esophageal dilation, etc. have also been discussed.  Questions have been answered.  All parties agreeable.  Please see history & physical in medical record for more information.  Medications:  Demerol 50 mg IV Versed 5 mg IV Cetacaine spray topically for oropharyngeal anesthesia  Description of procedure:  The endoscope was introduced through the mouth and advanced to the second portion of the duodenum without difficulty or limitations. The mucosal surfaces were surveyed very carefully during advancement of the scope and upon withdrawal.  Findings:  Esophagus:  Mucosa of the esophagus was normal. Focal erythema noted at GE junction. No ring or stricture was noted. GEJ:  37 cm Hiatus:  39 cm Stomach: Stomach was empty and distended very well with insufflation. Folds in the proximal stomach were normal. Examination of mucosa at body was normal. Antral mucosa revealed patchy erythema and edema which was more pronounced in the prepyloric and pyloric region. Focal mucosal edema and  scar noted at pyloric channel. Pyloric channel was patent. Angularis fundus and cardia were examined by retroflexing the scope and were  normal. Duodenum:  Normal bulbar and post bulbar mucosa.  Therapeutic/Diagnostic Maneuvers Performed:  Biopsy taken from antral and prepyloric mucosa.  Complications:  None  Impression: Small sliding heart hernia with mild changes of reflux esophagitis limited to GE junction. Nonerosive antral gastritis along with pyloric channel inflammation and small scar. Biopsy taken from prepyloric mucosa for routine histology. Suspect these changes may be secondary to NSAID therapy.  Recommendations:  Continue pantoprazole at 40 mg by mouth every morning. Patient advised to take Aleve on as-needed basis; no more than one at a time and no more than two on a given day. I will contact patient with results of biopsy and further recommendations.  Coreen Shippee U  06/13/2012  1:29 PM  CC: Dr. Milinda Antis, MD & Dr. Bonnetta Barry ref. provider found

## 2012-06-13 NOTE — H&P (Signed)
Rachel [name] is an 67 y.o. female.   Chief Complaint: Patient is here for diagnostic EGD. HPI: Patient is 67 year old African female who presents for 4 month history of intermittent epigastric pain and heartburn as well as nausea. Some mornings she wakes up with nausea and epigastric pain. She denies vomiting melena or rectal bleeding. Her appetite is fair. She has not lost any weight recently. She was seen in the office last week and advice to stop Celebrex and she was begun on pantoprazole. She is not feeling much better. She is up-to-date on screening for colorectal carcinoma. Her last exam was 4 years ago.  Past Medical History  Diagnosis Date  . Hypertension   . OA (osteoarthritis)   . Carpal tunnel syndrome   . Back pain     Past Surgical History  Procedure Date  . Abdominal hysterectomy 2006  . Breast cyst excision     right  . Carpal tunnel release   . Bunionectomy     left    Family History  Problem Relation Age of Onset  . Diabetes Father   . Cancer Father     prostate cancer   Social History:  reports that she has been smoking Cigarettes.  She has been smoking about .5 packs per day. She does not have any smokeless tobacco history on file. She reports that she drinks alcohol. She reports that she does not use illicit drugs.  Allergies: No Known Allergies  Medications Prior to Admission  Medication Sig Dispense Refill  . calcium-vitamin D (OSCAL-500) 500-400 MG-UNIT per tablet Take 1 tablet by mouth 2 (two) times daily.  60 tablet  3  . naproxen sodium (ALEVE) 220 MG tablet Take 220-440 mg by mouth as needed. For pain      . pantoprazole (PROTONIX) 40 MG tablet Take 1 tablet (40 mg total) by mouth daily.  30 tablet  3  . pseudoephedrine (SUDAFED) 30 MG tablet Take 30 mg by mouth every 4 (four) hours as needed. Allergies/sinus congestion.      . quinapril-hydrochlorothiazide (ACCURETIC) 10-12.5 MG per tablet Take 1 tablet by mouth daily.  30 tablet  3    No  results found for this or any previous visit (from the past 48 hour(s)). No results found.  ROS  Blood pressure 124/90, pulse 80, temperature 98.3 F (36.8 C), temperature source Oral, resp. rate 20, SpO2 95.00%. Physical Exam  Constitutional: She appears well-developed and well-nourished.  HENT:  Mouth/Throat: Oropharynx is clear and moist.  Eyes: Conjunctivae normal are normal. No scleral icterus.  Neck: No thyromegaly present.  Cardiovascular: Normal rate, regular rhythm and normal heart sounds.   No murmur heard. Respiratory: Effort normal and breath sounds normal.  GI: Soft. She exhibits no distension and no mass. There is Tenderness: mild epigastric tenderness..  Musculoskeletal: She exhibits no edema.  Lymphadenopathy:    She has no cervical adenopathy.  Neurological: She is alert.  Skin: Skin is warm and dry.     Assessment/Plan Recurrent epigastric pain and nausea. Diagnostic EGD.  REHMAN,NAJEEB U 06/13/2012, 1:01 PM

## 2012-06-15 ENCOUNTER — Encounter (HOSPITAL_COMMUNITY): Payer: Self-pay | Admitting: Internal Medicine

## 2012-06-15 ENCOUNTER — Ambulatory Visit: Payer: Medicare Other | Admitting: Family Medicine

## 2012-06-21 ENCOUNTER — Other Ambulatory Visit (INDEPENDENT_AMBULATORY_CARE_PROVIDER_SITE_OTHER): Payer: Self-pay | Admitting: Internal Medicine

## 2012-06-21 ENCOUNTER — Encounter (INDEPENDENT_AMBULATORY_CARE_PROVIDER_SITE_OTHER): Payer: Self-pay | Admitting: *Deleted

## 2012-06-21 DIAGNOSIS — R1013 Epigastric pain: Secondary | ICD-10-CM

## 2012-06-21 MED ORDER — SUCRALFATE 1 G PO TABS: 1.0000 g | ORAL_TABLET | Freq: Four times a day (QID) | ORAL | Status: DC

## 2012-08-09 ENCOUNTER — Ambulatory Visit: Payer: Medicare Other | Admitting: Family Medicine

## 2012-08-14 ENCOUNTER — Ambulatory Visit: Payer: Medicare Other | Admitting: Family Medicine

## 2012-08-21 ENCOUNTER — Ambulatory Visit (INDEPENDENT_AMBULATORY_CARE_PROVIDER_SITE_OTHER): Payer: Medicare Other | Admitting: Family Medicine

## 2012-08-21 ENCOUNTER — Encounter: Payer: Self-pay | Admitting: Family Medicine

## 2012-08-21 VITALS — BP 110/62 | HR 68 | Resp 18 | Ht 64.5 in | Wt 137.0 lb

## 2012-08-21 DIAGNOSIS — I10 Essential (primary) hypertension: Secondary | ICD-10-CM

## 2012-08-21 DIAGNOSIS — F17200 Nicotine dependence, unspecified, uncomplicated: Secondary | ICD-10-CM

## 2012-08-21 DIAGNOSIS — Z720 Tobacco use: Secondary | ICD-10-CM

## 2012-08-21 DIAGNOSIS — Z1239 Encounter for other screening for malignant neoplasm of breast: Secondary | ICD-10-CM

## 2012-08-21 DIAGNOSIS — J3489 Other specified disorders of nose and nasal sinuses: Secondary | ICD-10-CM

## 2012-08-21 DIAGNOSIS — K219 Gastro-esophageal reflux disease without esophagitis: Secondary | ICD-10-CM

## 2012-08-21 DIAGNOSIS — R0981 Nasal congestion: Secondary | ICD-10-CM

## 2012-08-21 LAB — CBC
Hemoglobin: 16.2 g/dL — ABNORMAL HIGH (ref 12.0–15.0)
MCH: 28 pg (ref 26.0–34.0)
MCV: 82.9 fL (ref 78.0–100.0)
Platelets: 196 10*3/uL (ref 150–400)
RBC: 5.78 MIL/uL — ABNORMAL HIGH (ref 3.87–5.11)
WBC: 6.2 10*3/uL (ref 4.0–10.5)

## 2012-08-21 MED ORDER — QUINAPRIL-HYDROCHLOROTHIAZIDE 10-12.5 MG PO TABS: 1.0000 | ORAL_TABLET | Freq: Every day | ORAL | Status: DC

## 2012-08-21 MED ORDER — PANTOPRAZOLE SODIUM 40 MG PO TBEC: 40.0000 mg | DELAYED_RELEASE_TABLET | Freq: Every day | ORAL | Status: DC

## 2012-08-21 NOTE — Patient Instructions (Addendum)
Restart protonix Take the mucinex twice a day with glass of water Flonase for sinuses Continue blood pressure medicine  I recommend calcium ( 1000mg ) and Vit D  (800IU) daily Mammogram to be scheduled at end of April  Get the labs done F/U 4 months

## 2012-08-22 DIAGNOSIS — J309 Allergic rhinitis, unspecified: Secondary | ICD-10-CM | POA: Insufficient documentation

## 2012-08-22 LAB — COMPREHENSIVE METABOLIC PANEL
Alkaline Phosphatase: 65 U/L (ref 39–117)
BUN: 15 mg/dL (ref 6–23)
CO2: 26 mEq/L (ref 19–32)
Creat: 0.89 mg/dL (ref 0.50–1.10)
Glucose, Bld: 87 mg/dL (ref 70–99)
Sodium: 138 mEq/L (ref 135–145)
Total Bilirubin: 0.7 mg/dL (ref 0.3–1.2)

## 2012-08-22 LAB — LIPID PANEL
Cholesterol: 208 mg/dL — ABNORMAL HIGH (ref 0–200)
HDL: 86 mg/dL (ref >39)
Total CHOL/HDL Ratio: 2.4 Ratio
Triglycerides: 83 mg/dL (ref <150)

## 2012-08-22 NOTE — Assessment & Plan Note (Signed)
Well controlled, fasting labs due 

## 2012-08-22 NOTE — Assessment & Plan Note (Signed)
Restart protonix

## 2012-08-22 NOTE — Assessment & Plan Note (Addendum)
Congestion with rhinitis, will have her use flonase and mucinex, no further antibiotics needed

## 2012-08-22 NOTE — Progress Notes (Signed)
  Subjective:    Patient ID: Rachel [name], female    DOB: [birth year], 67 y.o.   MRN: [id number]  HPI  Pt here to f/u chronic medical problems, GERD has been causing some problems, would like to restart protonix. Continues to have some sinus pressure though much improved, also has drainage on and off feels like ears are full.   Review of Systems   GEN- denies fatigue, fever, weight loss,weakness, recent illness HEENT- denies eye drainage, change in vision,+ nasal discharge, CVS- denies chest pain, palpitations RESP- denies SOB, cough, wheeze ABD- denies N/V, change in stools, abd pain GU- denies dysuria, hematuria, dribbling, incontinence MSK- denies joint pain, muscle aches, injury Neuro- denies headache, dizziness, syncope, seizure activity      Objective:   Physical Exam GEN- NAD, alert and oriented x3 HEENT- PERRL, EOMI, non injected sclera, pink conjunctiva, MMM, oropharynx clear, TM clear bilat,  Rhinorrhea clear, inflammed turbinates, sinus NT Neck- Supple, no LAD CVS- RRR, no murmur RESP-CTAB ABD-NABS,soft,NT,ND EXT- No edema Pulses- Radial, DP- 2+        Assessment & Plan:

## 2012-08-22 NOTE — Assessment & Plan Note (Signed)
Counseled on cessation she has vapor cigarrette she is using currently

## 2012-09-07 ENCOUNTER — Telehealth: Payer: Self-pay | Admitting: Family Medicine

## 2012-09-07 NOTE — Telephone Encounter (Signed)
Please let her know she does not qualify for these services at this time.

## 2012-09-17 ENCOUNTER — Ambulatory Visit (HOSPITAL_COMMUNITY)
Admission: RE | Admit: 2012-09-17 | Discharge: 2012-09-17 | Disposition: A | Discharge status: Home / Self Care | Payer: Medicare Other | Source: Ambulatory Visit | Attending: Family Medicine | Admitting: Family Medicine

## 2012-09-17 ENCOUNTER — Ambulatory Visit (HOSPITAL_COMMUNITY): Payer: Medicare Other

## 2012-09-17 DIAGNOSIS — Z1231 Encounter for screening mammogram for malignant neoplasm of breast: Secondary | ICD-10-CM | POA: Insufficient documentation

## 2012-09-17 DIAGNOSIS — Z1239 Encounter for other screening for malignant neoplasm of breast: Secondary | ICD-10-CM

## 2012-09-19 NOTE — Telephone Encounter (Signed)
Letter sent to patient notifying.

## 2012-10-12 ENCOUNTER — Encounter (HOSPITAL_COMMUNITY): Payer: Self-pay | Admitting: *Deleted

## 2012-10-12 ENCOUNTER — Emergency Department (HOSPITAL_COMMUNITY)
Admission: EM | Admit: 2012-10-12 | Discharge: 2012-10-12 | Disposition: A | Discharge status: Home / Self Care | Payer: Medicare Other | Attending: Emergency Medicine | Admitting: Emergency Medicine

## 2012-10-12 DIAGNOSIS — F17200 Nicotine dependence, unspecified, uncomplicated: Secondary | ICD-10-CM | POA: Insufficient documentation

## 2012-10-12 DIAGNOSIS — M545 Low back pain, unspecified: Secondary | ICD-10-CM | POA: Insufficient documentation

## 2012-10-12 DIAGNOSIS — Z8739 Personal history of other diseases of the musculoskeletal system and connective tissue: Secondary | ICD-10-CM | POA: Insufficient documentation

## 2012-10-12 DIAGNOSIS — Z79899 Other long term (current) drug therapy: Secondary | ICD-10-CM | POA: Insufficient documentation

## 2012-10-12 DIAGNOSIS — M549 Dorsalgia, unspecified: Secondary | ICD-10-CM

## 2012-10-12 DIAGNOSIS — I10 Essential (primary) hypertension: Secondary | ICD-10-CM | POA: Insufficient documentation

## 2012-10-12 MED ORDER — HYDROCODONE-ACETAMINOPHEN 5-325 MG PO TABS: 1.0000 | ORAL_TABLET | ORAL | Status: DC | PRN

## 2012-10-12 MED ORDER — MELOXICAM 7.5 MG PO TABS: ORAL_TABLET | ORAL | Status: DC

## 2012-10-12 NOTE — ED Notes (Signed)
Low back pain since Wednesday. No injury

## 2012-10-12 NOTE — ED Provider Notes (Signed)
Medical screening examination/treatment/procedure(s) were conducted as a shared visit with non-physician practitioner(s) and myself.  I personally evaluated the patient during the encounter  Pt with acute exacerbation of chronic back pain, no Red Flags. Rx for pain meds and PCP followup.   Charles B. Bernette Mayers, MD 10/12/12 1556
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
## 2012-10-13 NOTE — ED Provider Notes (Signed)
Medical screening examination/treatment/procedure(s) were performed by non-physician practitioner and as supervising physician I was immediately available for consultation/collaboration.   Charles B. Sheldon, MD 10/13/12 0858 

## 2013-05-16 ENCOUNTER — Other Ambulatory Visit: Payer: Self-pay | Admitting: Family Medicine

## 2013-05-16 NOTE — Telephone Encounter (Signed)
One refill sent.  Pt has CPE appt next month.

## 2013-06-04 ENCOUNTER — Ambulatory Visit: Payer: Self-pay | Admitting: Family Medicine

## 2013-06-14 ENCOUNTER — Ambulatory Visit (INDEPENDENT_AMBULATORY_CARE_PROVIDER_SITE_OTHER): Payer: Medicare HMO | Admitting: Family Medicine

## 2013-06-14 ENCOUNTER — Encounter: Payer: Self-pay | Admitting: Family Medicine

## 2013-06-14 VITALS — BP 118/78 | HR 82 | Temp 98.7°F | Resp 18 | Ht 63.0 in | Wt 143.0 lb

## 2013-06-14 DIAGNOSIS — R6889 Other general symptoms and signs: Secondary | ICD-10-CM

## 2013-06-14 DIAGNOSIS — Z23 Encounter for immunization: Secondary | ICD-10-CM

## 2013-06-14 DIAGNOSIS — Z0000 Encounter for general adult medical examination without abnormal findings: Secondary | ICD-10-CM

## 2013-06-14 DIAGNOSIS — Z720 Tobacco use: Secondary | ICD-10-CM

## 2013-06-14 DIAGNOSIS — C801 Malignant (primary) neoplasm, unspecified: Secondary | ICD-10-CM

## 2013-06-14 DIAGNOSIS — M25561 Pain in right knee: Secondary | ICD-10-CM

## 2013-06-14 DIAGNOSIS — N893 Dysplasia of vagina, unspecified: Secondary | ICD-10-CM

## 2013-06-14 DIAGNOSIS — IMO0002 Reserved for concepts with insufficient information to code with codable children: Secondary | ICD-10-CM

## 2013-06-14 DIAGNOSIS — R0981 Nasal congestion: Secondary | ICD-10-CM

## 2013-06-14 DIAGNOSIS — B977 Papillomavirus as the cause of diseases classified elsewhere: Secondary | ICD-10-CM

## 2013-06-14 DIAGNOSIS — N898 Other specified noninflammatory disorders of vagina: Secondary | ICD-10-CM

## 2013-06-14 DIAGNOSIS — L659 Nonscarring hair loss, unspecified: Secondary | ICD-10-CM

## 2013-06-14 DIAGNOSIS — Z8542 Personal history of malignant neoplasm of other parts of uterus: Secondary | ICD-10-CM

## 2013-06-14 DIAGNOSIS — I10 Essential (primary) hypertension: Secondary | ICD-10-CM

## 2013-06-14 DIAGNOSIS — Z124 Encounter for screening for malignant neoplasm of cervix: Secondary | ICD-10-CM

## 2013-06-14 DIAGNOSIS — M25569 Pain in unspecified knee: Secondary | ICD-10-CM

## 2013-06-14 DIAGNOSIS — J3489 Other specified disorders of nose and nasal sinuses: Secondary | ICD-10-CM

## 2013-06-14 DIAGNOSIS — Z09 Encounter for follow-up examination after completed treatment for conditions other than malignant neoplasm: Secondary | ICD-10-CM

## 2013-06-14 DIAGNOSIS — Z08 Encounter for follow-up examination after completed treatment for malignant neoplasm: Secondary | ICD-10-CM

## 2013-06-14 DIAGNOSIS — F17200 Nicotine dependence, unspecified, uncomplicated: Secondary | ICD-10-CM

## 2013-06-14 MED ORDER — PANTOPRAZOLE SODIUM 40 MG PO TBEC: 40.0000 mg | DELAYED_RELEASE_TABLET | Freq: Every day | ORAL | Status: DC

## 2013-06-14 MED ORDER — QUINAPRIL-HYDROCHLOROTHIAZIDE 10-12.5 MG PO TABS: ORAL_TABLET | ORAL | Status: DC

## 2013-06-14 MED ORDER — FLUTICASONE PROPIONATE 50 MCG/ACT NA SUSP: 2.0000 | Freq: Every day | NASAL | Status: DC

## 2013-06-14 MED ORDER — TRAMADOL HCL 50 MG PO TABS: 50.0000 mg | ORAL_TABLET | Freq: Four times a day (QID) | ORAL | Status: DC | PRN

## 2013-06-14 MED ORDER — TRIAMCINOLONE ACETONIDE 0.1 % EX CREA: 1.0000 "application " | TOPICAL_CREAM | Freq: Two times a day (BID) | CUTANEOUS | Status: DC

## 2013-06-14 NOTE — Patient Instructions (Addendum)
You need Calcium 1200mg  and Vit D 800IU total  Activia Yogurt Bone Density to be set up Stool cards - send back  Flu shot / Prevnar 13 given We will send letter with lab results Get shingles vaccine from pharmacy Get xray of knee Okay to continue aleve 1-2 times a day Try ultram for pain Get labs done fasting in [place] in March F/U 6 months

## 2013-06-14 NOTE — Progress Notes (Signed)
Subjective:    Patient ID: Rachel [name], female    DOB: Oct 18, 1945, 68 y.o.   MRN: [id number]  HPI Subjective:   Patient presents for Medicare Annual/Subsequent preventive examination.    Pt here for CPE and PAP Smear  ? History of uterine or other female vaginal cancer and HPV, has not had PAP screening in about 2 years. Denies vaginal bleeding or pelvic pain  Right knee pain x 3 months, no particular falls or injury, feels like it gives out on her and it crunches, occasional takes OTC meds  Continues to have some nasal congestion and drainage- has tried OTC allergy meds, also gets watery eyes a times  HTN- tolerating meds as prescribed     Review Past Medical/Family/Social: - per EMR   Risk Factors  Current exercise habits: None Dietary issues discussed: Yes  Cardiac risk factors: HTN  Depression Screen  (Note: if answer to either of the following is "Yes", a more complete depression screening is indicated)  Over the past two weeks, have you felt down, depressed or hopeless? No Over the past two weeks, have you felt little interest or pleasure in doing things? No Have you lost interest or pleasure in daily life? No Do you often feel hopeless? No Do you cry easily over simple problems? No   Activities of Daily Living  In your present state of health, do you have any difficulty performing the following activities?:  Driving? No  Managing money? No  Feeding yourself? No  Getting from bed to chair? No  Climbing a flight of stairs? No  Preparing food and eating?: No  Bathing or showering? No  Getting dressed: No  Getting to the toilet? No  Using the toilet:No  Moving around from place to place: No  In the past year have you fallen or had a near fall?:No  Are you sexually active? No  Do you have more than one partner? No   Hearing Difficulties: No  Do you often ask people to speak up or repeat themselves? No  Do you experience ringing or noises in your ears?  No Do you have difficulty understanding soft or whispered voices? No  Do you feel that you have a problem with memory? No Do you often misplace items? No  Do you feel safe at home? Yes  Cognitive Testing  Alert? Yes Normal Appearance?Yes  Oriented to person? Yes Place? Yes  Time? Yes  Recall of three objects? Yes  Can perform simple calculations? Yes  Displays appropriate judgment?Yes  Can read the correct time from a watch face?Yes   List the Names of Other Physician/Practitioners you currently use: None    Screening Tests / Date Colonoscopy      - Overdue             Zostavax - due Mammogram - UTD Influenza Vaccine - due     Assessment:    Annual wellness medicare exam   Plan:    During the course of the visit the patient was educated and counseled about appropriate screening and preventive services including:   Colorectal cancer screening - Stool cards given, declines colonoscopy Shingles vaccine. Prescription given to that she can get the vaccine at the pharmacy or Medicare part D.  Diet review for nutrition referral? Yes ____ Not Indicated __x__  Patient Instructions (the written plan) was given to the patient.  Medicare Attestation  I have personally reviewed:  The patient's medical and social history  Their use of  alcohol, tobacco or illicit drugs  Their current medications and supplements  The patient's functional ability including ADLs,fall risks, home safety risks, cognitive, and hearing and visual impairment  Diet and physical activities  Evidence for depression or mood disorders  The patient's weight, height, BMI, and visual acuity have been recorded in the chart. I have made referrals, counseling, and provided education to the patient based on review of the above and I have provided the patient with a written personalized care plan for preventive services.        Review of Systems  GEN- denies fatigue, fever, weight loss,weakness, recent  illness HEENT- denies eye drainage, change in vision, nasal discharge, CVS- denies chest pain, palpitations RESP- denies SOB, cough, wheeze ABD- denies N/V, change in stools, abd pain GU- denies dysuria, hematuria, dribbling, incontinence MSK- +joint pain, muscle aches, injury Neuro- denies headache, dizziness, syncope, seizure activity      Objective:   Physical Exam GEN- NAD, alert and oriented x3 HEENT- PERRL, EOMI, non injected sclera, pink conjunctiva, MMM, oropharynx clear Neck- supple, no thyromegaly Breast- normal symmetry, no nipple inversion,no nipple drainage, no nodules or lumps felt Nodes- no axillary nodes CVS- RRR, no murmur RESP-CTAB ABD-NABS,soft,NT,ND GU- normal external genitalia, vaginal mucosa pink and moist,no uterus/cervix, no ovarian masses, urethra normal position MSK- Right knee- Good ROM, +crepitus, patella non tender, no effusion EXT- No edema Pulses- Radial, DP- 2+        Assessment & Plan:

## 2013-06-16 NOTE — Assessment & Plan Note (Signed)
Continue to encourage cessation, she is trying to cut back

## 2013-06-16 NOTE — Assessment & Plan Note (Addendum)
NSAIDS/ULTRAM Xray of knee

## 2013-06-16 NOTE — Assessment & Plan Note (Signed)
Refilled TAC cream, previous given by derm

## 2013-06-16 NOTE — Assessment & Plan Note (Signed)
Well controlled, labs today

## 2013-06-16 NOTE — Assessment & Plan Note (Signed)
PAP on vaginal cuff, records did show HPV but I could not find specifics for the hysterectomy If this is negative I think we can stop screening

## 2013-06-16 NOTE — Assessment & Plan Note (Signed)
Add flonase, i think there is an allergy component

## 2013-06-17 LAB — PAP THINPREP ASCUS RFLX HPV RFLX TYPE

## 2013-06-20 ENCOUNTER — Encounter: Payer: Self-pay | Admitting: Family Medicine

## 2013-06-20 DIAGNOSIS — N893 Dysplasia of vagina, unspecified: Secondary | ICD-10-CM | POA: Insufficient documentation

## 2013-06-20 NOTE — Addendum Note (Signed)
Addended by: Vic Blackbird F on: 06/20/2013 01:53 PM   Modules accepted: Orders

## 2013-06-20 NOTE — Assessment & Plan Note (Signed)
Addendum- PAP Smear shows VAIN with LSIL/HPV She is status post hysterectomy due to recurrent LSIL with HPV, she continued to have HPV positive PAP on vaginal cuff [facility]  Now with change on PAP Smear - refer to GYN for recommendations

## 2013-06-27 ENCOUNTER — Telehealth: Payer: Self-pay | Admitting: Family Medicine

## 2013-06-27 NOTE — Telephone Encounter (Signed)
Call back number is [phone] Pt is wanting to know why [facility] is [facility]

## 2013-06-27 NOTE — Telephone Encounter (Signed)
Called pt back and gave her results of PAP smear and the reason why she was being referred

## 2013-07-01 ENCOUNTER — Ambulatory Visit (HOSPITAL_COMMUNITY)
Admission: RE | Admit: 2013-07-01 | Discharge: 2013-07-01 | Disposition: A | Discharge status: Home / Self Care | Payer: Medicare HMO | Source: Ambulatory Visit | Attending: Family Medicine | Admitting: Family Medicine

## 2013-07-01 DIAGNOSIS — M25561 Pain in right knee: Secondary | ICD-10-CM

## 2013-07-01 DIAGNOSIS — M898X9 Other specified disorders of bone, unspecified site: Secondary | ICD-10-CM | POA: Insufficient documentation

## 2013-07-01 DIAGNOSIS — M25569 Pain in unspecified knee: Secondary | ICD-10-CM | POA: Insufficient documentation

## 2013-07-08 ENCOUNTER — Encounter: Payer: Self-pay | Admitting: Obstetrics and Gynecology

## 2013-07-08 ENCOUNTER — Ambulatory Visit (INDEPENDENT_AMBULATORY_CARE_PROVIDER_SITE_OTHER): Payer: Medicare HMO | Admitting: Obstetrics and Gynecology

## 2013-07-08 VITALS — BP 120/78 | Ht 64.5 in | Wt 140.0 lb

## 2013-07-08 DIAGNOSIS — R87612 Low grade squamous intraepithelial lesion on cytologic smear of cervix (LGSIL): Secondary | ICD-10-CM

## 2013-07-08 DIAGNOSIS — N870 Mild cervical dysplasia: Secondary | ICD-10-CM

## 2013-07-08 DIAGNOSIS — Z3201 Encounter for pregnancy test, result positive: Secondary | ICD-10-CM

## 2013-07-08 DIAGNOSIS — Z3202 Encounter for pregnancy test, result negative: Secondary | ICD-10-CM

## 2013-07-08 LAB — POCT URINE PREGNANCY: PREG TEST UR: NEGATIVE

## 2013-07-08 MED ORDER — ESTROGENS, CONJUGATED 0.625 MG/GM VA CREA: 0.5000 | TOPICAL_CREAM | VAGINAL | Status: DC

## 2013-07-08 NOTE — Progress Notes (Signed)
This chart was transcribed for Dr. [name] by Ludger Nutting, ED scribe.    Rachel [name] 68 y.o. No obstetric history on file. here for colposcopy for EPITHELIAL CELL ABNORMALITY: SQUAMOUS CELLS LOW-GRADE SQUAMOUS INTRAEPITHELIAL LESION (LSIL) ENCOMPASSING: HPV/MILD DYSPLASIA/VAIN 1.  Patient is s/p total abdominal hysterectomy with BSO in 2006.   Pap smear on 06/14/13. Discussed role for HPV in cervical dysplasia, need for surveillance.  Patient given informed consent, signed copy in the chart, time out was performed.  Placed in lithotomy position. Cervix viewed with speculum and colposcope after application of acetic acid.   PE: cervix is surgically removed. Vaginal cuff is smooth and well healed. Mild vaginal atrophy  Colposcopy adequate? Yes  no visible lesions, no mosaicism, no punctation and no abnormal vasculature; biopsies NOT obtained.   ECC specimen NOT obtained.   Colposcopy IMPRESSION: no visible dysplasia on adequate colposcopy Patient was given post procedure instructions. .  Routine preventative health maintenance measures emphasized.   Plan: Premarin vaginal cream  TO USE 2X/WK, AND REPEAT PAP Q YR X 2 YR, AND ONCE NORMAL X2 , DISCONTINUE PAPS

## 2013-07-08 NOTE — Patient Instructions (Signed)
Use vag cream twice weekly Atrophic Vaginitis Atrophic vaginitis is a problem of low levels of estrogen in women. This problem can happen at any age. It is most common in women who have gone through menopause ("the change").  HOW WILL I KNOW IF I HAVE THIS PROBLEM? You may have:  Trouble with peeing (urinating), such as:  Going to the bathroom often.  A hard time holding your pee until you reach a bathroom.  Leaking pee.  Having pain when you pee.  Itching or a burning feeling.  Vaginal bleeding and spotting.  Pain during sex.  Dryness of the vagina.  A yellow, bad-smelling fluid (discharge) coming from the vagina. HOW WILL MY DOCTOR CHECK FOR THIS PROBLEM?  During your exam, your doctor will likely find the problem.  If there is a vaginal fluid, it may be checked for infection. HOW WILL THIS PROBLEM BE TREATED? Keep the vulvar skin as clean as possible. Moisturizers and lubricants can help with some of the symptoms. Estrogen replacement can help. There are 2 ways to take estrogen:  Systemic estrogen gets estrogen to your whole body. It takes many weeks or months before the symptoms get better.  You take an estrogen pill.  You use a skin patch. This is a patch that you put on your skin.  If you still have your uterus, your doctor may ask you to take a hormone. Talk to your doctor about the right medicine for you.  Estrogen cream.  This puts estrogen only at the part of your body where you apply it. The cream is put into the vagina or put on the vulvar skin. For some women, estrogen cream works faster than pills or the patch. CAN ALL WOMEN WITH THIS PROBLEM USE ESTROGEN? No. Women with certain types of cancer, liver problems, or problems with blood clots should not take estrogen. Your doctor can help you decide the best treatment for your symptoms. Document Released: 11/02/2007 Document Revised: 08/08/2011 Document Reviewed: 11/02/2007 [facility] Patient Information 2014  [place], [place].

## 2014-04-04 ENCOUNTER — Encounter: Payer: Self-pay | Admitting: Family Medicine

## 2014-04-04 ENCOUNTER — Ambulatory Visit (INDEPENDENT_AMBULATORY_CARE_PROVIDER_SITE_OTHER): Payer: Medicare HMO | Admitting: Family Medicine

## 2014-04-04 VITALS — BP 150/84 | HR 68 | Temp 98.6°F | Resp 14 | Ht 64.0 in | Wt 146.0 lb

## 2014-04-04 DIAGNOSIS — I10 Essential (primary) hypertension: Secondary | ICD-10-CM

## 2014-04-04 DIAGNOSIS — R251 Tremor, unspecified: Secondary | ICD-10-CM

## 2014-04-04 DIAGNOSIS — Z23 Encounter for immunization: Secondary | ICD-10-CM

## 2014-04-04 DIAGNOSIS — K5900 Constipation, unspecified: Secondary | ICD-10-CM

## 2014-04-04 DIAGNOSIS — K219 Gastro-esophageal reflux disease without esophagitis: Secondary | ICD-10-CM

## 2014-04-04 MED ORDER — PROPRANOLOL HCL 20 MG PO TABS
20.0000 mg | ORAL_TABLET | Freq: Two times a day (BID) | ORAL | Status: DC
Start: 2014-04-04 — End: 2014-09-03

## 2014-04-04 MED ORDER — PANTOPRAZOLE SODIUM 40 MG PO TBEC: 40.0000 mg | DELAYED_RELEASE_TABLET | Freq: Every day | ORAL | Status: DC

## 2014-04-04 MED ORDER — ESTROGENS, CONJUGATED 0.625 MG/GM VA CREA: 0.5000 | TOPICAL_CREAM | VAGINAL | Status: DC

## 2014-04-04 MED ORDER — LUBIPROSTONE 24 MCG PO CAPS: 24.0000 ug | ORAL_CAPSULE | Freq: Two times a day (BID) | ORAL | Status: DC

## 2014-04-04 MED ORDER — LINACLOTIDE 145 MCG PO CAPS: 145.0000 ug | ORAL_CAPSULE | Freq: Every day | ORAL | Status: DC

## 2014-04-04 NOTE — Assessment & Plan Note (Signed)
I've given her samples of amitiza 24 g twice a day from the office

## 2014-04-04 NOTE — Addendum Note (Signed)
Addended by: Sheral Flow on: 04/04/2014 05:40 PM   Modules accepted: Orders

## 2014-04-04 NOTE — Assessment & Plan Note (Signed)
Will treat with propanolol twice a day. I will repeat a TSH as well as a vitamin B-12 we may need to have her see neurology if he cannot improve the tremor with medication

## 2014-04-04 NOTE — Assessment & Plan Note (Signed)
Blood pressure uncontrolled however with her tremor which I believe is benign essential tremor I will start her on propanolol 20 mg twice a day we will discontinue the Accuretic at this time

## 2014-04-04 NOTE — Patient Instructions (Addendum)
Try AMITIZA twice a day with meal for constipation Propranolol twice a day for blood pressure and tremor We will call with lab results F/U 8 weeks

## 2014-04-04 NOTE — Assessment & Plan Note (Signed)
Re-start Protonix 

## 2014-04-04 NOTE — Progress Notes (Signed)
Patient ID: Rachel [name], female   DOB: [birth year], 68 y.o.   MRN: [id number]   Subjective:    Patient ID: Rachel [name], female    DOB: 03-07-1946, 68 y.o.   MRN: [id number]  Patient presents for Stomach Issue and Anxiety patient here to follow chronic medical problems she has not been seen since January 2015. She stopped her blood pressure medications with no particular reason. She has been having difficulty with her acid reflux she would like to restart her protonix. She's also had difficulty with her bowel she's been very constipated and straining she is having very small bowel movements. She has used over-the-counter laxatives however she does not remember to take the Miralax and would like to try a pill form.  She has noticed worsening of her tremor she did mention this back in 2013 her mother has an essential benign tremor. She does when she goes to get her nails done and when she reaches for things in her hands shake severely however when she is actually grasping something she does not notice the tremor is much the tremor is in both hands.    Review Of Systems:  GEN- denies fatigue, fever, weight loss,weakness, recent illness HEENT- denies eye drainage, change in vision, nasal discharge, CVS- denies chest pain, palpitations RESP- denies SOB, cough, wheeze ABD- denies N/V, change in stools, abd pain GU- denies dysuria, hematuria, dribbling, incontinence MSK- denies joint pain, muscle aches, injury Neuro- denies headache, dizziness, syncope, seizure activity       Objective:    BP 150/84 mmHg  Pulse 68  Temp(Src) 98.6 F (37 C) (Oral)  Resp 14  Ht 5\' 4"  (1.626 m)  Wt 146 lb (66.225 kg)  BMI 25.05 kg/m2 GEN- NAD, alert and oriented x3 HEENT- PERRL, EOMI, non injected sclera, pink conjunctiva, MMM, oropharynx clear Neck- Supple, no thyromegaly CVS- RRR, no murmur RESP-CTAB ABD-NABS,soft,NT,ND Neuro-CNII-XII in tact, resting tremor with outstretched hands, normal tone  UE EXT- No edema Pulses- Radial, DP- 2+        Assessment & Plan:      Problem List Items Addressed This Visit    Tremor   Relevant Orders      Vitamin B12      TSH   GERD (gastroesophageal reflux disease)   Relevant Medications      pantoprazole (PROTONIX) EC tablet      lubiprostone (AMITIZA) capsule   Essential hypertension, benign - Primary   Relevant Medications      propranolol (INDERAL) IR tablet   Other Relevant Orders      CBC with Differential      Comprehensive metabolic panel      Note: This dictation was prepared with Dragon dictation along with smaller phrase technology. Any transcriptional errors that result from this process are unintentional.

## 2014-04-05 LAB — CBC WITH DIFFERENTIAL/PLATELET
Basophils Absolute: 0.1 10*3/uL (ref 0.0–0.1)
Basophils Relative: 1 % (ref 0–1)
EOS PCT: 1 % (ref 0–5)
Eosinophils Absolute: 0.1 10*3/uL (ref 0.0–0.7)
HCT: 40.9 % (ref 36.0–46.0)
HEMOGLOBIN: 13.5 g/dL (ref 12.0–15.0)
LYMPHS ABS: 1.7 10*3/uL (ref 0.7–4.0)
Lymphocytes Relative: 31 % (ref 12–46)
MCH: 27.7 pg (ref 26.0–34.0)
MCHC: 33 g/dL (ref 30.0–36.0)
MCV: 83.8 fL (ref 78.0–100.0)
MONO ABS: 0.5 10*3/uL (ref 0.1–1.0)
Monocytes Relative: 9 % (ref 3–12)
Neutro Abs: 3.2 10*3/uL (ref 1.7–7.7)
Neutrophils Relative %: 58 % (ref 43–77)
Platelets: 218 10*3/uL (ref 150–400)
RBC: 4.88 MIL/uL (ref 3.87–5.11)
RDW: 14.6 % (ref 11.5–15.5)
WBC: 5.6 10*3/uL (ref 4.0–10.5)

## 2014-04-05 LAB — COMPREHENSIVE METABOLIC PANEL
ALT: 17 U/L (ref 0–35)
AST: 20 U/L (ref 0–37)
Albumin: 4.3 g/dL (ref 3.5–5.2)
Alkaline Phosphatase: 65 U/L (ref 39–117)
BUN: 15 mg/dL (ref 6–23)
CO2: 24 meq/L (ref 19–32)
Calcium: 10.3 mg/dL (ref 8.4–10.5)
Chloride: 104 mEq/L (ref 96–112)
Creat: 0.94 mg/dL (ref 0.50–1.10)
Glucose, Bld: 87 mg/dL (ref 70–99)
Potassium: 4.3 mEq/L (ref 3.5–5.3)
Sodium: 140 mEq/L (ref 135–145)
TOTAL PROTEIN: 7.5 g/dL (ref 6.0–8.3)
Total Bilirubin: 0.5 mg/dL (ref 0.2–1.2)

## 2014-04-05 LAB — VITAMIN B12: VITAMIN B 12: 872 pg/mL (ref 211–911)

## 2014-04-05 LAB — TSH: TSH: 0.676 u[IU]/mL (ref 0.350–4.500)

## 2014-04-10 ENCOUNTER — Telehealth: Payer: Self-pay | Admitting: *Deleted

## 2014-04-10 NOTE — Telephone Encounter (Signed)
Received call from patient.   Reports that she would like to discuss a condition with MD that she forgot to mention during her OV.   Declines discussing condition with clinical staff.   Requested MD to contact patient to discuss.   Advised that MD is out of office on Thursday, but message will given to MD on Friday.

## 2014-04-11 NOTE — Telephone Encounter (Signed)
Pt called states that she notices her memory is going and she forgets things such as her keys or what someone just said and wanted a memory medication Advised we need to discuss in office, due MMSE  No other signs of dementia Also has a handicap form that needs to be completed- she will mail form to me

## 2014-06-23 ENCOUNTER — Other Ambulatory Visit: Payer: Self-pay | Admitting: Family Medicine

## 2014-06-23 NOTE — Telephone Encounter (Signed)
Refill appropriate and filled per protocol. 

## 2014-08-15 ENCOUNTER — Encounter: Payer: Self-pay | Admitting: Family Medicine

## 2014-08-15 ENCOUNTER — Encounter: Payer: Medicare HMO | Admitting: Family Medicine

## 2014-09-03 ENCOUNTER — Encounter: Payer: Self-pay | Admitting: Family Medicine

## 2014-09-03 ENCOUNTER — Ambulatory Visit (INDEPENDENT_AMBULATORY_CARE_PROVIDER_SITE_OTHER): Payer: Commercial Managed Care - HMO | Admitting: Family Medicine

## 2014-09-03 VITALS — BP 134/74 | HR 72 | Temp 98.2°F | Resp 14 | Ht 64.0 in | Wt 150.0 lb

## 2014-09-03 DIAGNOSIS — R251 Tremor, unspecified: Secondary | ICD-10-CM

## 2014-09-03 DIAGNOSIS — M6530 Trigger finger, unspecified finger: Secondary | ICD-10-CM

## 2014-09-03 DIAGNOSIS — K5900 Constipation, unspecified: Secondary | ICD-10-CM | POA: Diagnosis not present

## 2014-09-03 DIAGNOSIS — N893 Dysplasia of vagina, unspecified: Secondary | ICD-10-CM | POA: Diagnosis not present

## 2014-09-03 DIAGNOSIS — Z23 Encounter for immunization: Secondary | ICD-10-CM | POA: Diagnosis not present

## 2014-09-03 DIAGNOSIS — I10 Essential (primary) hypertension: Secondary | ICD-10-CM | POA: Diagnosis not present

## 2014-09-03 DIAGNOSIS — Z13820 Encounter for screening for osteoporosis: Secondary | ICD-10-CM | POA: Diagnosis not present

## 2014-09-03 DIAGNOSIS — E559 Vitamin D deficiency, unspecified: Secondary | ICD-10-CM

## 2014-09-03 DIAGNOSIS — K219 Gastro-esophageal reflux disease without esophagitis: Secondary | ICD-10-CM | POA: Diagnosis not present

## 2014-09-03 DIAGNOSIS — Z1239 Encounter for other screening for malignant neoplasm of breast: Secondary | ICD-10-CM | POA: Diagnosis not present

## 2014-09-03 DIAGNOSIS — Z0000 Encounter for general adult medical examination without abnormal findings: Secondary | ICD-10-CM

## 2014-09-03 MED ORDER — ESTROGENS, CONJUGATED 0.625 MG/GM VA CREA: 0.5000 | TOPICAL_CREAM | VAGINAL | Status: DC

## 2014-09-03 MED ORDER — ZOSTER VACCINE LIVE 19400 UNT/0.65ML ~~LOC~~ SOLR: 0.6500 mL | Freq: Once | SUBCUTANEOUS | Status: DC

## 2014-09-03 MED ORDER — PROPRANOLOL HCL 20 MG PO TABS: 20.0000 mg | ORAL_TABLET | Freq: Two times a day (BID) | ORAL | Status: DC

## 2014-09-03 MED ORDER — PANTOPRAZOLE SODIUM 40 MG PO TBEC: 40.0000 mg | DELAYED_RELEASE_TABLET | Freq: Every day | ORAL | Status: DC

## 2014-09-03 NOTE — Assessment & Plan Note (Signed)
Controlled with PPI

## 2014-09-03 NOTE — Assessment & Plan Note (Signed)
Neg Colpo after abnormal PAP last year, hopefully if this one is clear, she can discontinue PAP Smears

## 2014-09-03 NOTE — Assessment & Plan Note (Addendum)
Well controlled, no change to meds No significant hand or feet swelling noted, will have her decrease salt intake, increase fluids

## 2014-09-03 NOTE — Patient Instructions (Addendum)
Get the labs done fasting  Schedule your Mammogram and Bone Density We will call with PAP Smear results Continue current medications Referral to surgeon for trigger finger Watch your salt intake Drink at least 8 cups of water Try to get the shingles vaccine from pharmacy F/U 6 months

## 2014-09-03 NOTE — Assessment & Plan Note (Signed)
Referral to hand specialist. 

## 2014-09-03 NOTE — Assessment & Plan Note (Signed)
Check Vitamin D level and Bone Density to be done

## 2014-09-03 NOTE — Progress Notes (Signed)
Patient ID: Rachel [name], female   DOB: 1945-10-17, 69 y.o.   MRN: [id number] Subjective:   Patient presents for Medicare Annual/Subsequent preventive examination.  Pt here for annual wellness exam. She is also here for repeat Pap smear she has history of abnormal Pap smear on her vaginal cuff she had colposcopy done one year ago which was negative. She is here today for repeat sample. She has history of hypertension as well as essential tremor these both improved when I place her on propanolol at her last visit. Her acid reflux is also controlled with her Protonix.  She is now using a probiotic for her constipation and states that this works well the M a TZ samples I gave her did not help the Amitiza samples I gave her caused her to have severe diarrhea.  She complains of pain and stiffness in her middle finger on her right hand she states sometimes she gets stuck and she has to pry it open this is been happening more and more over the past couple months. She has difficulty doing her hair as well as using a knife as it always gets locked up. Review Past Medical/Family/Social: Per EMR   Risk Factors  Current exercise habits: walks Dietary issues discussed: None  Cardiac risk factors: HTN  Depression Screen  (Note: if answer to either of the following is "Yes", a more complete depression screening is indicated)  Over the past two weeks, have you felt down, depressed or hopeless? No Over the past two weeks, have you felt little interest or pleasure in doing things? No Have you lost interest or pleasure in daily life? No Do you often feel hopeless? No Do you cry easily over simple problems? No   Activities of Daily Living  In your present state of health, do you have any difficulty performing the following activities?:  Driving? No  Managing money? No  Feeding yourself? No  Getting from bed to chair? No  Climbing a flight of stairs? No  Preparing food and eating?: No  Bathing or  showering? No  Getting dressed: No  Getting to the toilet? No  Using the toilet:No  Moving around from place to place: No  In the past year have you fallen or had a near fall?:No  Are you sexually active? No  Do you have more than one partner? No   Hearing Difficulties: No  Do you often ask people to speak up or repeat themselves? No  Do you experience ringing or noises in your ears? No Do you have difficulty understanding soft or whispered voices? No  Do you feel that you have a problem with memory? No Do you often misplace items? No  Do you feel safe at home? Yes  Cognitive Testing  Alert? Yes Normal Appearance?Yes  Oriented to person? Yes Place? Yes  Time? Yes  Recall of three objects? Yes  Can perform simple calculations? Yes  Displays appropriate judgment?Yes  Can read the correct time from a watch face?Yes   List the Names of Other Physician/Practitioners you currently use: None   Screening Tests / Date Per EMR Colonoscopy  - UTD                   Zostavax - Due Mammogram - Due Tetanus/tdap- UTD  ROS: GEN- denies fatigue, fever, weight loss,weakness, recent illness HEENT- denies eye drainage, change in vision, nasal discharge, CVS- denies chest pain, palpitations RESP- denies SOB, cough, wheeze ABD- denies N/V, change in stools, abd  pain GU- denies dysuria, hematuria, dribbling, incontinence MSK- + joint pain, muscle aches, injury Neuro- denies headache, dizziness, syncope, seizure activity   PHYSICAL: GEN- NAD, alert and oriented x3 HEENT- PERRL, EOMI, non injected sclera, pink conjunctiva, MMM, oropharynx clear Neck- Supple, no thyromegaly, no bruit Breast- normal symmetry, no nipple inversion,no nipple drainage, no nodules or lumps felt Nodes- no axillary nodes CVS- RRR, no murmur RESP-CTAB ABD-NABS,soft,NT,ND GU- normal external genitalia, vaginal mucosa pink and moist, atrophy noted  Bladder prolapse noted,  no ovarian masses,  Rectum- normal tone,  FOBT neg, no external lesions MSK- stiff ROM 3rd digit right hand, triggering noted EXT- No edema Pulses- Radial, DP- 2+    Assessment:    Annual wellness medicare exam   Plan:    During the course of the visit the patient was educated and counseled about appropriate screening and preventive services including:  Screening mammography  Shingles vaccine. Prescription given to that she can get the vaccine at the pharmacy or Medicare part D.   I aksed her to please have her cardioloist send records since we have none on file.  Diet review for nutrition referral? Yes ____ Not Indicated __x__  Patient Instructions (the written plan) was given to the patient.  Medicare Attestation  I have personally reviewed:  The patient's medical and social history  Their use of alcohol, tobacco or illicit drugs  Their current medications and supplements  The patient's functional ability including ADLs,fall risks, home safety risks, cognitive, and hearing and visual impairment  Diet and physical activities  Evidence for depression or mood disorders  The patient's weight, height, BMI, and visual acuity have been recorded in the chart. I have made referrals, counseling, and provided education to the patient based on review of the above and I have provided the patient with a written personalized care plan for preventive services.

## 2014-09-04 LAB — PAP THINPREP ASCUS RFLX HPV RFLX TYPE

## 2014-09-05 ENCOUNTER — Other Ambulatory Visit: Payer: Self-pay | Admitting: *Deleted

## 2014-09-05 DIAGNOSIS — N890 Mild vaginal dysplasia: Secondary | ICD-10-CM

## 2014-09-05 DIAGNOSIS — R87612 Low grade squamous intraepithelial lesion on cytologic smear of cervix (LGSIL): Secondary | ICD-10-CM

## 2014-09-05 DIAGNOSIS — N870 Mild cervical dysplasia: Secondary | ICD-10-CM

## 2014-09-12 ENCOUNTER — Other Ambulatory Visit: Payer: Self-pay | Admitting: *Deleted

## 2014-09-12 DIAGNOSIS — I10 Essential (primary) hypertension: Secondary | ICD-10-CM

## 2014-09-12 DIAGNOSIS — R251 Tremor, unspecified: Secondary | ICD-10-CM

## 2014-09-12 DIAGNOSIS — K219 Gastro-esophageal reflux disease without esophagitis: Secondary | ICD-10-CM

## 2014-09-12 DIAGNOSIS — K5900 Constipation, unspecified: Secondary | ICD-10-CM

## 2014-09-12 DIAGNOSIS — Z23 Encounter for immunization: Secondary | ICD-10-CM

## 2014-09-12 MED ORDER — ESTROGENS, CONJUGATED 0.625 MG/GM VA CREA: 1.0000 | TOPICAL_CREAM | VAGINAL | Status: DC

## 2014-09-12 MED ORDER — PROPRANOLOL HCL 20 MG PO TABS: 20.0000 mg | ORAL_TABLET | Freq: Two times a day (BID) | ORAL | Status: DC

## 2014-09-12 MED ORDER — PANTOPRAZOLE SODIUM 40 MG PO TBEC: 40.0000 mg | DELAYED_RELEASE_TABLET | Freq: Every day | ORAL | Status: DC

## 2014-09-12 NOTE — Telephone Encounter (Signed)
Received fax requesting refill on all medications with 90 day supply.   Prescriptions sent to pharmacy.

## 2014-09-16 ENCOUNTER — Telehealth: Payer: Self-pay | Admitting: *Deleted

## 2014-09-16 NOTE — Telephone Encounter (Signed)
Submitted humana referral thru acuity connect for authorization to Dr. Shaune Leeks, MD with authorization number [id number]  Requesting provider: Neysa Hotter  Treating provider: Charolette Child  Number of visits:6  Start Date: 09/15/14  End Date:03/14/15  Dx:M65.30-Trigger finger,unspecified finger  Copy has been faxed to Dr. Briscoe Burns office for record/review

## 2014-09-18 ENCOUNTER — Telehealth: Payer: Self-pay | Admitting: *Deleted

## 2014-09-18 NOTE — Telephone Encounter (Signed)
Pt is scheduled for 09/23/14 at [place] with Dr.Keeling at 2:45pm, left message on vm both numbers to return my call

## 2014-09-22 NOTE — Telephone Encounter (Signed)
lmtrc

## 2014-09-29 ENCOUNTER — Encounter: Payer: Self-pay | Admitting: *Deleted

## 2014-09-29 ENCOUNTER — Encounter: Payer: Commercial Managed Care - HMO | Admitting: Obstetrics and Gynecology

## 2014-10-07 ENCOUNTER — Encounter: Payer: Commercial Managed Care - HMO | Admitting: Obstetrics and Gynecology

## 2014-10-15 ENCOUNTER — Ambulatory Visit (INDEPENDENT_AMBULATORY_CARE_PROVIDER_SITE_OTHER): Payer: Commercial Managed Care - HMO | Admitting: Obstetrics and Gynecology

## 2014-10-15 ENCOUNTER — Encounter: Payer: Self-pay | Admitting: Obstetrics and Gynecology

## 2014-10-15 VITALS — BP 136/90 | Ht 64.5 in | Wt 149.0 lb

## 2014-10-15 DIAGNOSIS — R896 Abnormal cytological findings in specimens from other organs, systems and tissues: Secondary | ICD-10-CM

## 2014-10-15 DIAGNOSIS — R87629 Unspecified abnormal cytological findings in specimens from vagina: Secondary | ICD-10-CM

## 2014-10-15 NOTE — Progress Notes (Signed)
Patient ID: Rachel [name], female   DOB: 1945/12/16, 69 y.o.   MRN: [id number]  [name] 69 y.o. S/P total abdominal hysterectomy with BSO here for colposcopy for low-grade squamous intraepithelial neoplasia (LGSIL - encompassing HPV,mild dysplasia,CIN I) pap smear on 09/03/14 and 06/14/13.  She states she has been using Premarin cream regularly.   Discussed role for HPV in cervical dysplasia, need for surveillance.  Patient given informed consent, signed copy in the chart, time out was performed.  Placed in lithotomy position. Cervix viewed with speculum and colposcope after application of acetic acid.   Colposcopy adequate? Yes  Vaginal cuff smooth and somewhat atrophic; no visible lesions, no mosaicism, no punctation and no abnormal vasculature; biopsies NOT obtained.   ECC specimen NOT obtained.  Colposcopy IMPRESSION: No visible lesions or abnormalities noted on colposcopy exam. Vaginal tissues appear normal.  Patient was given post procedure instructions. Plan:  Patient's pap smears do not seem to be changing from year to year and colposcopy shows no abnormalities. Follow up once every year for a pap and colposcopy until pap and colpo both are normal. Once normal, can stop pap smears. Patient verbalized understanding and agreed to plan.  Routine preventative health maintenance measures emphasized.    This chart was SCRIBED for [name], MD by [name], ED Scribe. This patient was seen in room 2 and the patient's care was started at 3:55 PM.  I personally performed the services described in this documentation, which was SCRIBED in my presence. The recorded information has been reviewed and considered accurate. It has been edited as necessary during review. [name], MD

## 2014-10-15 NOTE — Progress Notes (Signed)
Patient ID: Rachel [name], female   DOB: 1945/10/28, 69 y.o.   MRN: [id number] Pt here today for follow up colposcopy. Pt states that she wants to discuss with Dr. Glo Herring what other options she has.

## 2014-10-30 LAB — CBC WITH DIFFERENTIAL/PLATELET
BASOS ABS: 0.1 10*3/uL (ref 0.0–0.1)
Basophils Relative: 1 % (ref 0–1)
Eosinophils Absolute: 0.1 10*3/uL (ref 0.0–0.7)
Eosinophils Relative: 2 % (ref 0–5)
HCT: 44.1 % (ref 36.0–46.0)
HEMOGLOBIN: 14.2 g/dL (ref 12.0–15.0)
Lymphocytes Relative: 38 % (ref 12–46)
Lymphs Abs: 2.2 10*3/uL (ref 0.7–4.0)
MCH: 27.6 pg (ref 26.0–34.0)
MCHC: 32.2 g/dL (ref 30.0–36.0)
MCV: 85.8 fL (ref 78.0–100.0)
MONOS PCT: 9 % (ref 3–12)
MPV: 11.5 fL (ref 8.6–12.4)
Monocytes Absolute: 0.5 10*3/uL (ref 0.1–1.0)
Neutro Abs: 3 10*3/uL (ref 1.7–7.7)
Neutrophils Relative %: 50 % (ref 43–77)
PLATELETS: 201 10*3/uL (ref 150–400)
RBC: 5.14 MIL/uL — ABNORMAL HIGH (ref 3.87–5.11)
RDW: 14.4 % (ref 11.5–15.5)
WBC: 5.9 10*3/uL (ref 4.0–10.5)

## 2014-10-30 LAB — COMPREHENSIVE METABOLIC PANEL
ALT: 18 U/L (ref 0–35)
AST: 22 U/L (ref 0–37)
Albumin: 4.2 g/dL (ref 3.5–5.2)
Alkaline Phosphatase: 57 U/L (ref 39–117)
BILIRUBIN TOTAL: 0.7 mg/dL (ref 0.2–1.2)
BUN: 14 mg/dL (ref 6–23)
CO2: 26 meq/L (ref 19–32)
Calcium: 10.2 mg/dL (ref 8.4–10.5)
Chloride: 101 mEq/L (ref 96–112)
Creat: 0.95 mg/dL (ref 0.50–1.10)
GLUCOSE: 92 mg/dL (ref 70–99)
Potassium: 4.6 mEq/L (ref 3.5–5.3)
Sodium: 137 mEq/L (ref 135–145)
TOTAL PROTEIN: 7.3 g/dL (ref 6.0–8.3)

## 2014-10-30 LAB — LIPID PANEL
Cholesterol: 232 mg/dL — ABNORMAL HIGH (ref 0–200)
HDL: 126 mg/dL (ref >=46)
LDL CALC: 87 mg/dL (ref 0–99)
Total CHOL/HDL Ratio: 1.8 Ratio
Triglycerides: 96 mg/dL (ref <150)
VLDL: 19 mg/dL (ref 0–40)

## 2014-10-30 LAB — VITAMIN D 25 HYDROXY (VIT D DEFICIENCY, FRACTURES): Vit D, 25-Hydroxy: 57 ng/mL (ref 30–100)

## 2014-10-31 ENCOUNTER — Encounter: Payer: Self-pay | Admitting: *Deleted

## 2014-12-04 ENCOUNTER — Other Ambulatory Visit: Payer: Self-pay | Admitting: Family Medicine

## 2014-12-04 DIAGNOSIS — R229 Localized swelling, mass and lump, unspecified: Principal | ICD-10-CM

## 2014-12-04 DIAGNOSIS — IMO0002 Reserved for concepts with insufficient information to code with codable children: Secondary | ICD-10-CM

## 2014-12-09 ENCOUNTER — Ambulatory Visit (HOSPITAL_COMMUNITY)
Admission: RE | Admit: 2014-12-09 | Discharge: 2014-12-09 | Disposition: A | Discharge status: Home / Self Care | Payer: Commercial Managed Care - HMO | Source: Ambulatory Visit | Attending: Family Medicine | Admitting: Family Medicine

## 2014-12-09 ENCOUNTER — Other Ambulatory Visit: Payer: Self-pay | Admitting: Family Medicine

## 2014-12-09 DIAGNOSIS — IMO0002 Reserved for concepts with insufficient information to code with codable children: Secondary | ICD-10-CM

## 2014-12-09 DIAGNOSIS — R229 Localized swelling, mass and lump, unspecified: Secondary | ICD-10-CM

## 2014-12-09 DIAGNOSIS — N63 Unspecified lump in breast: Secondary | ICD-10-CM | POA: Insufficient documentation

## 2014-12-19 ENCOUNTER — Telehealth: Payer: Self-pay | Admitting: Family Medicine

## 2014-12-19 MED ORDER — TRIAMCINOLONE ACETONIDE 0.1 % EX CREA: TOPICAL_CREAM | CUTANEOUS | Status: DC

## 2014-12-19 NOTE — Telephone Encounter (Signed)
[place] Order Pt is needing a refill on Priamcinolone 0.1% (3 month supply)

## 2014-12-19 NOTE — Telephone Encounter (Signed)
Call placed to patient.   Reports that she requires triamcinolone for alopecia.   Prescription sent to pharmacy.

## 2015-06-05 ENCOUNTER — Telehealth: Payer: Self-pay | Admitting: Family Medicine

## 2015-06-05 ENCOUNTER — Encounter: Payer: Self-pay | Admitting: *Deleted

## 2015-06-05 DIAGNOSIS — K5900 Constipation, unspecified: Secondary | ICD-10-CM

## 2015-06-05 DIAGNOSIS — Z23 Encounter for immunization: Secondary | ICD-10-CM

## 2015-06-05 DIAGNOSIS — I10 Essential (primary) hypertension: Secondary | ICD-10-CM

## 2015-06-05 DIAGNOSIS — K219 Gastro-esophageal reflux disease without esophagitis: Secondary | ICD-10-CM

## 2015-06-05 DIAGNOSIS — R251 Tremor, unspecified: Secondary | ICD-10-CM

## 2015-06-05 MED ORDER — PROPRANOLOL HCL 20 MG PO TABS: 20.0000 mg | ORAL_TABLET | Freq: Two times a day (BID) | ORAL | Status: DC

## 2015-06-05 MED ORDER — PANTOPRAZOLE SODIUM 40 MG PO TBEC: 40.0000 mg | DELAYED_RELEASE_TABLET | Freq: Every day | ORAL | Status: DC

## 2015-06-05 NOTE — Telephone Encounter (Signed)
Medication filled x1 with no refills.   Requires office visit before any further refills can be given.   Letter sent.  

## 2015-06-05 NOTE — Telephone Encounter (Signed)
CB# [phone] pantoprazole (PROTONIX) 40 MG tablet propranolol (INDERAL) 20 MG tablet  Patient would like both prescription sent into [place]

## 2015-06-22 ENCOUNTER — Ambulatory Visit (INDEPENDENT_AMBULATORY_CARE_PROVIDER_SITE_OTHER): Payer: PPO | Admitting: Family Medicine

## 2015-06-22 ENCOUNTER — Encounter: Payer: Self-pay | Admitting: Family Medicine

## 2015-06-22 VITALS — BP 134/72 | HR 78 | Temp 99.2°F | Resp 14 | Ht 65.0 in | Wt 147.0 lb

## 2015-06-22 DIAGNOSIS — K219 Gastro-esophageal reflux disease without esophagitis: Secondary | ICD-10-CM | POA: Diagnosis not present

## 2015-06-22 DIAGNOSIS — K5900 Constipation, unspecified: Secondary | ICD-10-CM | POA: Diagnosis not present

## 2015-06-22 DIAGNOSIS — R251 Tremor, unspecified: Secondary | ICD-10-CM | POA: Diagnosis not present

## 2015-06-22 DIAGNOSIS — M6530 Trigger finger, unspecified finger: Secondary | ICD-10-CM | POA: Diagnosis not present

## 2015-06-22 DIAGNOSIS — I10 Essential (primary) hypertension: Secondary | ICD-10-CM | POA: Diagnosis not present

## 2015-06-22 DIAGNOSIS — Z23 Encounter for immunization: Secondary | ICD-10-CM

## 2015-06-22 MED ORDER — PROPRANOLOL HCL 20 MG PO TABS: 20.0000 mg | ORAL_TABLET | Freq: Two times a day (BID) | ORAL | Status: DC

## 2015-06-22 MED ORDER — PANTOPRAZOLE SODIUM 40 MG PO TBEC: 40.0000 mg | DELAYED_RELEASE_TABLET | Freq: Every day | ORAL | Status: DC

## 2015-06-22 MED ORDER — POLYETHYLENE GLYCOL 3350 17 GM/SCOOP PO POWD: 17.0000 g | Freq: Two times a day (BID) | ORAL | Status: DC | PRN

## 2015-06-22 MED ORDER — ESTROGENS, CONJUGATED 0.625 MG/GM VA CREA: 1.0000 | TOPICAL_CREAM | VAGINAL | Status: DC

## 2015-06-22 NOTE — Patient Instructions (Addendum)
Shingles vaccine sent to pharmacy Try the Miralax  Flu shot given  Take TYlenol twice a day  F/U 6 months for Physical

## 2015-06-22 NOTE — Progress Notes (Signed)
Patient ID: Rachel [name], female   DOB: 1945-08-18, 70 y.o.   MRN: [id number]   Subjective:    Patient ID: Rachel [name], female    DOB: 05-08-1946, 70 y.o.   MRN: [id number]  Patient presents for F/U  issue here to follow-up. She has multiple concerns.  She continues to have problems with her trigger finger on her right hand she would like to go ahead and proceed with orthopedic she never had the injection done after the last referral.  She has back pain on and off she gets stiff especially in the morning she also gets stiffness in her neck. She has old injuries to her neck and her back that she sustained when she was working. She denies any change in bowel or bladder she denies any radiating symptoms down the extremities. She's been taking Aleve which helps.    she has difficulty with constipation she has been taking probiotics and this helps with the bloating and the gas but she still does not evacuate completely and has to go back to the bathroom multiple times. She finds this cell strain as well. She has not had any blood in the stool.   she is due for flu shot    Medications reviewed  Review Of Systems:  GEN- denies fatigue, fever, weight loss,weakness, recent illness HEENT- denies eye drainage, change in vision, nasal discharge, CVS- denies chest pain, palpitations RESP- denies SOB, cough, wheeze ABD- denies N/V, change in stools, abd pain GU- denies dysuria, hematuria, dribbling, incontinence MSK- +joint pain, muscle aches, injury Neuro- denies headache, dizziness, syncope, seizure activity       Objective:    BP 134/72 mmHg  Pulse 78  Temp(Src) 99.2 F (37.3 C) (Oral)  Resp 14  Ht 5\' 5"  (1.651 m)  Wt 147 lb (66.679 kg)  BMI 24.46 kg/m2 GEN- NAD, alert and oriented x3 HEENT- PERRL, EOMI, non injected sclera, pink conjunctiva, MMM, oropharynx clear CVS- RRR, no murmur RESP-CTAB ABD-NABS,soft,NT,ND MSK- stiff ROM 3rd digit right hand, triggering noted EXT- No  edema Pulses- Radial 2+       Assessment & Plan:      Problem List Items Addressed This Visit    None      Note: This dictation was prepared with Dragon dictation along with smaller phrase technology. Any transcriptional errors that result from this process are unintentional.

## 2015-06-23 ENCOUNTER — Other Ambulatory Visit: Payer: Self-pay | Admitting: *Deleted

## 2015-06-23 MED ORDER — ZOSTER VACCINE LIVE 19400 UNT/0.65ML ~~LOC~~ SOLR: 0.6500 mL | Freq: Once | SUBCUTANEOUS | Status: DC

## 2015-06-24 ENCOUNTER — Encounter: Payer: Self-pay | Admitting: Family Medicine

## 2015-06-24 NOTE — Assessment & Plan Note (Signed)
controlled 

## 2015-06-24 NOTE — Assessment & Plan Note (Signed)
Continue protonix  

## 2015-06-24 NOTE — Assessment & Plan Note (Signed)
Start miralax daily

## 2015-06-24 NOTE — Assessment & Plan Note (Signed)
Referral to ortho

## 2015-07-23 ENCOUNTER — Telehealth: Payer: Self-pay | Admitting: *Deleted

## 2015-07-23 ENCOUNTER — Ambulatory Visit (INDEPENDENT_AMBULATORY_CARE_PROVIDER_SITE_OTHER): Payer: Self-pay | Admitting: Orthopaedic Surgery

## 2015-07-23 ENCOUNTER — Encounter: Payer: Self-pay | Admitting: Orthopaedic Surgery

## 2015-07-23 VITALS — BP 174/102 | HR 59 | Temp 97.5°F | Ht 64.5 in | Wt 148.0 lb

## 2015-07-23 DIAGNOSIS — M6530 Trigger finger, unspecified finger: Secondary | ICD-10-CM

## 2015-07-23 NOTE — Patient Instructions (Signed)
Trigger Finger °Trigger finger (digital tendinitis and stenosing tenosynovitis) is a common disorder that causes an often painful catching of the fingers or thumb. It occurs as a clicking, snapping, or locking of a finger in the palm of the hand. This is caused by a problem with the tendons that flex or bend the fingers sliding smoothly through their sheaths. The condition may occur in any finger or a couple fingers at the same time.  °The finger may lock with the finger curled or suddenly straighten out with a snap. This is more common in patients with rheumatoid arthritis and diabetes. Left untreated, the condition may get worse to the point where the finger becomes locked in flexion, like making a fist, or less commonly locked with the finger straightened out. °CAUSES  °· Inflammation and scarring that lead to swelling around the tendon sheath. °· Repeated or forceful movements. °· Rheumatoid arthritis, an autoimmune disease that affects joints. °· Gout. °· Diabetes mellitus. °SIGNS AND SYMPTOMS °· Soreness and swelling of your finger. °· A painful clicking or snapping as you bend and straighten your finger. °DIAGNOSIS  °Your health care provider will do a physical exam of your finger to diagnose trigger finger. °TREATMENT  °· Splinting for 6-8 weeks may be helpful. °· Nonsteroidal anti-inflammatory medicines (NSAIDs) can help to relieve the pain and inflammation. °· Cortisone injections, along with splinting, may speed up recovery. Several injections may be required. Cortisone may give relief after one injection. °· Surgery is another treatment that may be used if conservative treatments do not work. Surgery can be minor, without incisions (a cut does not have to be made), and can be done with a needle through the skin. °· Other surgical choices involve an open procedure in which the surgeon opens the hand through a small incision and cuts the pulley so the tendon can again slide smoothly. Your hand will still  work fine. °HOME CARE INSTRUCTIONS °· Apply ice to the injured area, twice per day: °¨ Put ice in a plastic bag. °¨ Place a towel between your skin and the bag. °¨ Leave the ice on for 20 minutes, 3-4 times a day. °· Rest your hand often. °MAKE SURE YOU:  °· Understand these instructions. °· Will watch your condition. °· Will get help right away if you are not doing well or get worse. °  °This information is not intended to replace advice given to you by your health care provider. Make sure you discuss any questions you have with your health care provider. °  °Document Released: 03/05/2004 Document Revised: 01/16/2013 Document Reviewed: 10/16/2012 °[employer]vier Interactive Patient Education ©2016 [employer] ° °

## 2015-07-23 NOTE — Progress Notes (Addendum)
[name], female DOB:Oct 17, 1945, 70 y.o. [id number]  Chief Complaint  Patient presents with  . Hand Pain    Right middle finger mild pain and locking    HPI  Rachel [name] is a 70 y.o. female who has had triggering of the right long finger for about six to eight weeks getting worse with time.  She has no known trauma, no redness, no numbness.  She had some locking of the left thumb in the past and had surgery on that.  It is doing well now.  She has pain when it locks in flexion and has to use her other hand to extend the long finger.  I have told her about surgery and release of the A1 pulley, injection which may help for a period of time and it may not, and to let it be.  She would like to try the injection. I went over the risks and imponderables of this.  HPI  Body mass index is 25.02 kg/(m^2).   Review of Systems  Constitutional:       Patient does not have Diabetes Mellitus. Patient has hypertension. Patient does not have COPD or shortness of breath. Patient does not have BMI > 35. Patient does not have current smoking history.  HENT: Positive for congestion.   Respiratory: Negative for cough and shortness of breath.   Cardiovascular: Negative for chest pain and leg swelling.  Endocrine: Negative for cold intolerance.  Musculoskeletal: Positive for arthralgias.  Allergic/Immunologic: Negative for environmental allergies.    Past Medical History  Diagnosis Date  . Hypertension   . OA (osteoarthritis)   . Carpal tunnel syndrome   . Back pain   . Low grade squamous intraepith lesion on cytologic smear cervix (lgsil)     +hpv, HAD HYSTERECTOMY    Past Surgical History  Procedure Laterality Date  . Abdominal hysterectomy  2006  . Carpal tunnel release    . Bunionectomy      left  . Esophagogastroduodenoscopy  06/13/2012    Procedure: ESOPHAGOGASTRODUODENOSCOPY (EGD);  Surgeon: Rogene Houston, MD;  Location: [facility];  Service: Endoscopy;   Laterality: N/A;  200  . Breast cyst excision      right    Family History  Problem Relation Age of Onset  . Diabetes Father   . Cancer Father     prostate cancer  . Cancer Sister     breast    Social History Social History  Substance Use Topics  . Smoking status: Former Smoker -- 0.25 packs/day    Types: Cigarettes    Quit date: 07/28/2013  . Smokeless tobacco: Never Used  . Alcohol Use: 0.0 oz/week    0 Standard drinks or equivalent per week     Comment: occ    No Known Allergies  Current Outpatient Prescriptions  Medication Sig Dispense Refill  . acidophilus (RISAQUAD) CAPS capsule Take by mouth daily.    . Cholecalciferol (VITAMIN D) 2000 UNITS tablet Take 2,000 Units by mouth daily.    Marland Kitchen conjugated estrogens (PREMARIN) vaginal cream Place 1 Applicatorful vaginally 2 (two) times a week. 127.5 g 1  . Multiple Vitamin (MULTIVITAMIN WITH MINERALS) TABS tablet Take 1 tablet by mouth daily.    . pantoprazole (PROTONIX) 40 MG tablet Take 1 tablet (40 mg total) by mouth daily. 90 tablet 1  . polyethylene glycol powder (GLYCOLAX/MIRALAX) powder Take 17 g by mouth 2 (two) times daily as needed. 10050 g 1  . propranolol (INDERAL) 20  MG tablet Take 1 tablet (20 mg total) by mouth 2 (two) times daily. 180 tablet 1  . triamcinolone cream (KENALOG) 0.1 % Apply to affected areas 2x daily as needed. 453.6 g 3  . zoster vaccine live, PF, (ZOSTAVAX) 96295 UNT/0.65ML injection Inject 19,400 Units into the skin once. 1 each 0   No current facility-administered medications for this visit.     Physical Exam  Blood pressure 174/102, pulse 59, temperature 97.5 F (36.4 C), height 5' 4.5" (1.638 m), weight 148 lb (67.132 kg).  Constitutional: overall normal hygiene, normal nutrition, well developed, normal grooming, normal body habitus. Assistive device:none  Musculoskeletal: gait and station Limp none, muscle tone and strength are normal, no tremors or atrophy is present.  .   Neurological: coordination overall normal.  Deep tendon reflex/nerve stretch intact.  Sensation normal.  Cranial nerves II-XII intact.   Skin:   normal overall no scars, lesions, ulcers or rashes. No psoriasis.  Psychiatric: Alert and oriented x 3.  Recent memory intact, remote memory unclear.  Normal mood and affect. Well groomed.  Good eye contact.  Cardiovascular: overall no swelling, no varicosities, no edema bilaterally, normal temperatures of the legs and arms, no clubbing, cyanosis and good capillary refill.  Lymphatic: palpation is normal.   Extremities:the right hand has tenderness over the A1 pulley of the long finger with locking.  She has incomplete grip of this dominant hand secondary to the locking of the finger long.  The other fingers have no locking or crepitus.  The left hand is normal. Inspection per above Strength and tone normal upper extremity Range of motion normal of upper extremity except for the long finger of the right hand which has locking in flexion.  It is tender.  NV is intact.  Color is normal.   The patient request injection, verbal consent was obtained.  The right hand was prepped appropriately after time out was performed.   Sterile technique was observed and injection of 1 cc of Depo-Medrol 40 mg with several cc's of plain xylocaine. Anesthesia was provided by ethyl chloride and a 20-gauge needle was used to inject the long finger around the A1 pulley area. The injection was tolerated well.  A band aid dressing was applied.  The patient was advised to apply ice later today and tomorrow to the injection sight as needed.  Additional services performed: She has hypertension.  Her blood pressure is elevated today.  She says she watches her diet and takes her medicine.  She was concerned what I would do today here in the office. I told her to relax but she needs to get it checked again soon.  She has had carpal tunnel release in the past and if she gets  some numbness of the finger, it will be related more to the injection today and it should recover.  The patient has been educated about the nature of the problem(s) and counseled on treatment options.  The patient appeared to understand what I have discussed and is in agreement with it. Encounter Diagnosis  Name Primary?  . Trigger finger, acquired Yes    PLAN Call if any problems.  Precautions discussed.  Continue current medications.   Return to clinic 2 wks If feeling better, call and cancel

## 2015-07-23 NOTE — Telephone Encounter (Signed)
Submitted [employer] referral thru [facility] for authorization on 07/22/15 to Dr. Charolette Child with authorization [id number]  Requesting provider: Neysa Hotter  Treating provider: Charolette Child  Number of visits:6  Start Date: 07/23/15  End Date:01/19/16  Dx:M65.30- trigger finger,unspecified finger

## 2015-08-06 ENCOUNTER — Ambulatory Visit: Payer: Self-pay | Admitting: Orthopaedic Surgery

## 2015-08-12 ENCOUNTER — Telehealth: Payer: Self-pay | Admitting: Family Medicine

## 2015-08-12 DIAGNOSIS — Z23 Encounter for immunization: Secondary | ICD-10-CM

## 2015-08-12 DIAGNOSIS — K5900 Constipation, unspecified: Secondary | ICD-10-CM

## 2015-08-12 DIAGNOSIS — K219 Gastro-esophageal reflux disease without esophagitis: Secondary | ICD-10-CM

## 2015-08-12 DIAGNOSIS — I10 Essential (primary) hypertension: Secondary | ICD-10-CM

## 2015-08-12 DIAGNOSIS — R251 Tremor, unspecified: Secondary | ICD-10-CM

## 2015-08-12 NOTE — Telephone Encounter (Signed)
Call placed to patient. LMTRC.  

## 2015-08-12 NOTE — Telephone Encounter (Signed)
Patient would like someone to call her regarding her mail order service for her medications     [phone]

## 2015-08-14 MED ORDER — POLYETHYLENE GLYCOL 3350 17 GM/SCOOP PO POWD: 17.0000 g | Freq: Two times a day (BID) | ORAL | Status: DC | PRN

## 2015-08-14 MED ORDER — PROPRANOLOL HCL 20 MG PO TABS: 20.0000 mg | ORAL_TABLET | Freq: Two times a day (BID) | ORAL | Status: DC

## 2015-08-14 MED ORDER — PANTOPRAZOLE SODIUM 40 MG PO TBEC: 40.0000 mg | DELAYED_RELEASE_TABLET | Freq: Every day | ORAL | Status: DC

## 2015-08-14 NOTE — Telephone Encounter (Signed)
Call placed to patient.   Requested refill on Inderall, Pantoprazole and Miralax.   Prescription sent to pharmacy.

## 2015-09-08 ENCOUNTER — Ambulatory Visit (INDEPENDENT_AMBULATORY_CARE_PROVIDER_SITE_OTHER): Payer: PPO | Admitting: Obstetrics and Gynecology

## 2015-09-08 ENCOUNTER — Other Ambulatory Visit (HOSPITAL_COMMUNITY)
Admission: RE | Admit: 2015-09-08 | Discharge: 2015-09-08 | Disposition: A | Discharge status: Home / Self Care | Payer: PPO | Source: Ambulatory Visit | Attending: Obstetrics and Gynecology | Admitting: Obstetrics and Gynecology

## 2015-09-08 ENCOUNTER — Encounter: Payer: Self-pay | Admitting: Obstetrics and Gynecology

## 2015-09-08 VITALS — BP 130/80 | HR 76 | Ht 64.0 in | Wt 146.4 lb

## 2015-09-08 DIAGNOSIS — Z1212 Encounter for screening for malignant neoplasm of rectum: Secondary | ICD-10-CM

## 2015-09-08 DIAGNOSIS — N893 Dysplasia of vagina, unspecified: Secondary | ICD-10-CM

## 2015-09-08 DIAGNOSIS — R87629 Unspecified abnormal cytological findings in specimens from vagina: Secondary | ICD-10-CM

## 2015-09-08 DIAGNOSIS — Z1151 Encounter for screening for human papillomavirus (HPV): Secondary | ICD-10-CM | POA: Diagnosis not present

## 2015-09-08 DIAGNOSIS — Z01419 Encounter for gynecological examination (general) (routine) without abnormal findings: Secondary | ICD-10-CM | POA: Insufficient documentation

## 2015-09-08 DIAGNOSIS — Z124 Encounter for screening for malignant neoplasm of cervix: Secondary | ICD-10-CM | POA: Diagnosis not present

## 2015-09-08 LAB — HEMOCCULT GUIAC POC 1CARD (OFFICE): Fecal Occult Blood, POC: NEGATIVE

## 2015-09-08 NOTE — Progress Notes (Signed)
Patient ID: Rachel [name], female   DOB: May 16, 1946, 70 y.o.   MRN: [id number]  Assessment:  Annual Gyn Exam Repeat pap due to hx VAIN 2016   Plan:  1. pap smear done, next pap due 1 yr 2. return annually or prn 3    Annual mammogram advised Subjective:  Rachel [name] is a 70 y.o. female No obstetric history on file. who presents for annual exam. No LMP recorded. Patient has had a hysterectomy. The patient has complaints today of difficult exams. Not sexually active x 6 months.  The following portions of the patient's history were reviewed and updated as appropriate: allergies, current medications, past family history, past medical history, past social history, past surgical history and problem list. Past Medical History  Diagnosis Date  . Hypertension   . OA (osteoarthritis)   . Carpal tunnel syndrome   . Back pain   . Low grade squamous intraepith lesion on cytologic smear cervix (lgsil)     +hpv, HAD HYSTERECTOMY    Past Surgical History  Procedure Laterality Date  . Abdominal hysterectomy  2006  . Carpal tunnel release    . Bunionectomy      left  . Esophagogastroduodenoscopy  06/13/2012    Procedure: ESOPHAGOGASTRODUODENOSCOPY (EGD);  Surgeon: Rogene Houston, MD;  Location: [facility];  Service: Endoscopy;  Laterality: N/A;  200  . Breast cyst excision      right     Current outpatient prescriptions:  .  acidophilus (RISAQUAD) CAPS capsule, Take by mouth daily., Disp: , Rfl:  .  Cholecalciferol (VITAMIN D) 2000 UNITS tablet, Take 2,000 Units by mouth daily., Disp: , Rfl:  .  Multiple Vitamin (MULTIVITAMIN WITH MINERALS) TABS tablet, Take 1 tablet by mouth daily., Disp: , Rfl:  .  pantoprazole (PROTONIX) 40 MG tablet, Take 1 tablet (40 mg total) by mouth daily., Disp: 90 tablet, Rfl: 1 .  polyethylene glycol powder (GLYCOLAX/MIRALAX) powder, Take 17 g by mouth 2 (two) times daily as needed., Disp: 10050 g, Rfl: 1 .  propranolol (INDERAL) 20 MG tablet, Take 1  tablet (20 mg total) by mouth 2 (two) times daily., Disp: 180 tablet, Rfl: 1 .  Pseudoephedrine-APAP-DM (DAYQUIL PO), Take by mouth as needed., Disp: , Rfl:  .  triamcinolone cream (KENALOG) 0.1 %, Apply to affected areas 2x daily as needed., Disp: 453.6 g, Rfl: 3  Review of Systems Constitutional: negative Gastrointestinal: negative Genitourinary: using cream pv 1x/wk             Painful exams due to introitus size.  Objective:  BP 130/80 mmHg  Pulse 76  Ht 5\' 4"  (1.626 m)  Wt 146 lb 6.4 oz (66.407 kg)  BMI 25.12 kg/m2   BMI: Body mass index is 25.12 kg/(m^2).  General Appearance: Alert, appropriate appearance for age. No acute distress HEENT: Grossly normal Neck / Thyroid:  Cardiovascular: RRR; normal S1, S2, no murmur Lungs: CTA bilaterally Back: No CVAT Breast Exam: No dimpling, nipple retraction or discharge. No masses or nodes. and No masses or nodes.No dimpling, nipple retraction or discharge. Gastrointestinal: Soft, non-tender, no masses or organomegaly Pelvic Exam: External genitalia: normal general appearance Vaginal: normal mucosa without prolapse or lesions and atrophic mucosa Cervix: absent and removed surgically Adnexa: normal bimanual exam Rectal: good sphincter tone Rectovaginal: guaiac negative stool obtained and no rectocele Lymphatic Exam: Non-palpable nodes in neck, clavicular, axillary, or inguinal regions Skin: no rash or abnormalities Neurologic: Normal gait and speech, no tremor  Psychiatric: Alert and  oriented, appropriate affect.  Urinalysis:Not done  Rachel [name]. MD Pgr [phone] 4:58 PM

## 2015-09-11 LAB — CYTOLOGY - PAP

## 2016-03-14 ENCOUNTER — Other Ambulatory Visit: Payer: Self-pay | Admitting: Family Medicine

## 2016-03-14 DIAGNOSIS — I10 Essential (primary) hypertension: Secondary | ICD-10-CM

## 2016-03-14 DIAGNOSIS — R251 Tremor, unspecified: Secondary | ICD-10-CM

## 2016-03-14 DIAGNOSIS — Z23 Encounter for immunization: Secondary | ICD-10-CM

## 2016-03-14 DIAGNOSIS — K5900 Constipation, unspecified: Secondary | ICD-10-CM

## 2016-03-14 DIAGNOSIS — K219 Gastro-esophageal reflux disease without esophagitis: Secondary | ICD-10-CM

## 2016-04-25 ENCOUNTER — Telehealth: Payer: Self-pay | Admitting: Family Medicine

## 2016-04-25 DIAGNOSIS — K219 Gastro-esophageal reflux disease without esophagitis: Secondary | ICD-10-CM

## 2016-04-25 DIAGNOSIS — K5900 Constipation, unspecified: Secondary | ICD-10-CM

## 2016-04-25 DIAGNOSIS — Z23 Encounter for immunization: Secondary | ICD-10-CM

## 2016-04-25 DIAGNOSIS — I10 Essential (primary) hypertension: Secondary | ICD-10-CM

## 2016-04-25 DIAGNOSIS — R251 Tremor, unspecified: Secondary | ICD-10-CM

## 2016-04-25 MED ORDER — PROPRANOLOL HCL 20 MG PO TABS: 20.0000 mg | ORAL_TABLET | Freq: Two times a day (BID) | ORAL | 1 refills | Status: DC

## 2016-04-25 NOTE — Telephone Encounter (Signed)
Prescription sent to pharmacy.

## 2016-04-25 NOTE — Telephone Encounter (Signed)
Patient is calling to get refill on her propranolol  20 mg to go to [facility]  [phone]

## 2016-06-08 ENCOUNTER — Ambulatory Visit (INDEPENDENT_AMBULATORY_CARE_PROVIDER_SITE_OTHER): Payer: PPO | Admitting: Family Medicine

## 2016-06-08 ENCOUNTER — Encounter: Payer: Self-pay | Admitting: Family Medicine

## 2016-06-08 VITALS — BP 140/78 | HR 88 | Temp 98.7°F | Resp 14 | Ht 64.0 in | Wt 149.0 lb

## 2016-06-08 DIAGNOSIS — N644 Mastodynia: Secondary | ICD-10-CM

## 2016-06-08 DIAGNOSIS — Z780 Asymptomatic menopausal state: Secondary | ICD-10-CM

## 2016-06-08 DIAGNOSIS — M5030 Other cervical disc degeneration, unspecified cervical region: Secondary | ICD-10-CM

## 2016-06-08 DIAGNOSIS — I10 Essential (primary) hypertension: Secondary | ICD-10-CM | POA: Diagnosis not present

## 2016-06-08 DIAGNOSIS — K219 Gastro-esophageal reflux disease without esophagitis: Secondary | ICD-10-CM | POA: Diagnosis not present

## 2016-06-08 DIAGNOSIS — E559 Vitamin D deficiency, unspecified: Secondary | ICD-10-CM | POA: Diagnosis not present

## 2016-06-08 DIAGNOSIS — M6530 Trigger finger, unspecified finger: Secondary | ICD-10-CM

## 2016-06-08 DIAGNOSIS — Z0000 Encounter for general adult medical examination without abnormal findings: Secondary | ICD-10-CM | POA: Diagnosis not present

## 2016-06-08 DIAGNOSIS — Z23 Encounter for immunization: Secondary | ICD-10-CM | POA: Diagnosis not present

## 2016-06-08 DIAGNOSIS — R251 Tremor, unspecified: Secondary | ICD-10-CM | POA: Diagnosis not present

## 2016-06-08 DIAGNOSIS — Z1231 Encounter for screening mammogram for malignant neoplasm of breast: Secondary | ICD-10-CM

## 2016-06-08 DIAGNOSIS — K5900 Constipation, unspecified: Secondary | ICD-10-CM | POA: Diagnosis not present

## 2016-06-08 DIAGNOSIS — Z1239 Encounter for other screening for malignant neoplasm of breast: Secondary | ICD-10-CM

## 2016-06-08 MED ORDER — PROPRANOLOL HCL 20 MG PO TABS: 20.0000 mg | ORAL_TABLET | Freq: Two times a day (BID) | ORAL | 2 refills | Status: DC

## 2016-06-08 MED ORDER — PANTOPRAZOLE SODIUM 40 MG PO TBEC: 40.0000 mg | DELAYED_RELEASE_TABLET | Freq: Every day | ORAL | 2 refills | Status: DC

## 2016-06-08 NOTE — Progress Notes (Signed)
Subjective:   Patient presents for Medicare Annual/Subsequent preventive examination. She has multiple complaints like her typical visits  Left sided neck pain-His history of degenerative disc disease in her cervical spine. She states she had injury when she worked many years ago. In the past she was told that she would need surgery but she did not want to proceed with this. She's been taking Aleve or ibuprofen and this does help. She does get pain that radiates into the left side of her neck and shoulder.  She continues to have polyps with her trigger finger she did have an injection done by orthopedics it did not help she knows that she may need to have surgery had multiple questions about the surgery and recuperation advised that she will need to speak orthopedist about this.  She states that she noticed some tenderness and a hard spot on her breast. Is right underneath where she had a benign cyst removed many years ago. Both breasts actually still tender at times. Not had any drainage from the nipple.  She's had some nasal congestion it is clear when it does drain she's not had any fever no signs of pressure  She circled yes on all the hearing questions she states that she does have difficulty with lower voices and occasionally has ringing but does not want any evaluation or hearing aid   She is following with GYN her exams  Review Past Medical/Family/Social: Per EMR    Risk Factors  Current exercise habits: walks Dietary issues discussed: yes  Cardiac risk factors:   Depression Screen  (Note: if answer to either of the following is "Yes", a more complete depression screening is indicated)  Over the past two weeks, have you felt down, depressed or hopeless? No Over the past two weeks, have you felt little interest or pleasure in doing things? No Have you lost interest or pleasure in daily life? No Do you often feel hopeless? No Do you cry easily over simple problems? No    Activities of Daily Living  In your present state of health, do you have any difficulty performing the following activities?:  Driving? No  Managing money? No  Feeding yourself? No  Getting from bed to chair? No  Climbing a flight of stairs? yes  Preparing food and eating?: No  Bathing or showering? No  Getting dressed: No  Getting to the toilet? yes Using the toilet:No  Moving around from place to place: No  In the past year have you fallen or had a near fall?:yes Are you sexually active? No  Do you have more than one partner? No   Hearing Difficulties: yes Do you often ask people to speak up or repeat themselves? No  Do you experience ringing or noises in your ears? yes Do you have difficulty understanding soft or whispered voices? yes Do you feel that you have a problem with memory? yes Do you often misplace items? yes Do you feel safe at home? Yes  Cognitive Testing  Alert? Yes Normal Appearance?Yes  Oriented to person? Yes Place? Yes  Time? Yes  Recall of three objects? Yes  Can perform simple calculations? Yes  Displays appropriate judgment?Yes  Can read the correct time from a watch face?Yes   List the Names of Other Physician/Practitioners you currently use:  [facility]- Dr. Luna Glasgow    Screening Tests / Date Colonoscopy   UTD  Zostavax Declines  Mammogram  UTD  July 2016  Bone Density Due  Influenza Vaccine  Due  Pneumonia- UTD Tetanus/tdap - not covered   ROS: GEN- denies fatigue, fever, weight loss,weakness, recent illness HEENT- denies eye drainage, change in vision, nasal discharge, CVS- denies chest pain, palpitations RESP- denies SOB, cough, wheeze ABD- denies N/V, change in stools, abd pain GU- denies dysuria, hematuria, dribbling, incontinence MSK- +joint pain, muscle aches, injury Neuro- denies headache, dizziness, syncope, seizure activity  Physical: GEN- NAD, alert and oriented x3 HEENT- PERRL, EOMI, non  injected sclera, pink conjunctiva, MMM, oropharynx clear Neck- Supple, no thryomegaly, mild TTP C spine, fair ROM, no spasm, neg spurlings  CVS- RRR, no murmur RESP-CTAB Breast- normal symmetry, no nipple inversion,no nipple drainage, no nodules or lumps felt,dense tissue felt bilat, previous scar right breast 9 oclock position mild TTP Nodes- no axillary nodes ABD-NABS, soft,NT,ND EXT- No edema, trigger 3rd digit right hand Pulses- Radial, DP- 2+    Assessment:    Annual wellness medicare exam   Plan:    During the course of the visit the patient was educated and counseled about appropriate screening and preventive services including:  Will obtain mammogram did not fill any particular nodular lump she has had cyst removed from the breast before. Just scar tissue generlized tenderness.  We'll also need bone density  Referral back to orthopedics for her trigger finger. On the release done  Guards to her neck she has known degenerative disc disease which I think is likely worsening she like to have x-ray she has not had any recent injury to the neck. I discussed her options with regards to this. Is severe she cannot maintain the pain with her anti-inflammatories and she can try injections last resort would be surgical intervention.  Depression screen 6- she seems to be bored and her homes doesn't have much to do. Discussed going to the senior center getting into some activities surrounding herself with others.  Fasting labs to be done   Diet review for nutrition referral? Yes ____ Not Indicated __x__  Patient Instructions (the written plan) was given to the patient.  Medicare Attestation  I have personally reviewed:  The patient's medical and social history  Their use of alcohol, tobacco or illicit drugs  Their current medications and supplements  The patient's functional ability including ADLs,fall risks, home safety risks, cognitive, and hearing and visual impairment  Diet and  physical activities  Evidence for depression or mood disorders  The patient's weight, height, BMI, and visual acuity have been recorded in the chart. I have made referrals, counseling, and provided education to the patient based on review of the above and I have provided the patient with a written personalized care plan for preventive services.

## 2016-06-08 NOTE — Patient Instructions (Addendum)
Referral to Dr. Luna Glasgow  We willSchedule your mammogram/bone density Get the xray of your neck Use the nasal saline Shingles vaccine sent to pharmacy F/U 6 MONTHS

## 2016-06-09 ENCOUNTER — Encounter: Payer: Self-pay | Admitting: Family Medicine

## 2016-06-09 LAB — LIPID PANEL
CHOL/HDL RATIO: 1.8 ratio (ref <5.0)
Cholesterol: 249 mg/dL — ABNORMAL HIGH (ref <200)
HDL: 141 mg/dL (ref >50)
LDL CALC: 81 mg/dL (ref <100)
Triglycerides: 135 mg/dL (ref <150)
VLDL: 27 mg/dL (ref <30)

## 2016-06-09 LAB — CBC WITH DIFFERENTIAL/PLATELET
BASOS PCT: 1 %
Basophils Absolute: 53 cells/uL (ref 0–200)
Eosinophils Absolute: 53 cells/uL (ref 15–500)
Eosinophils Relative: 1 %
HCT: 43.8 % (ref 35.0–45.0)
Hemoglobin: 14 g/dL (ref 12.0–15.0)
Lymphocytes Relative: 35 %
Lymphs Abs: 1855 cells/uL (ref 850–3900)
MCH: 28.1 pg (ref 27.0–33.0)
MCHC: 32 g/dL (ref 32.0–36.0)
MCV: 87.8 fL (ref 80.0–100.0)
MPV: 11.4 fL (ref 7.5–12.5)
Monocytes Absolute: 583 cells/uL (ref 200–950)
Monocytes Relative: 11 %
NEUTROS PCT: 52 %
Neutro Abs: 2756 cells/uL (ref 1500–7800)
PLATELETS: 190 10*3/uL (ref 140–400)
RBC: 4.99 MIL/uL (ref 3.80–5.10)
RDW: 14.5 % (ref 11.0–15.0)
WBC: 5.3 10*3/uL (ref 3.8–10.8)

## 2016-06-09 LAB — COMPREHENSIVE METABOLIC PANEL
ALK PHOS: 65 U/L (ref 33–130)
ALT: 21 U/L (ref 6–29)
AST: 23 U/L (ref 10–35)
Albumin: 4.4 g/dL (ref 3.6–5.1)
BUN: 13 mg/dL (ref 7–25)
CALCIUM: 10.2 mg/dL (ref 8.6–10.4)
CHLORIDE: 102 mmol/L (ref 98–110)
CO2: 27 mmol/L (ref 20–31)
Creat: 0.96 mg/dL — ABNORMAL HIGH (ref 0.60–0.93)
GLUCOSE: 90 mg/dL (ref 70–99)
Potassium: 4.5 mmol/L (ref 3.5–5.3)
Sodium: 139 mmol/L (ref 135–146)
Total Bilirubin: 0.7 mg/dL (ref 0.2–1.2)
Total Protein: 7.7 g/dL (ref 6.1–8.1)

## 2016-06-09 LAB — VITAMIN D 25 HYDROXY (VIT D DEFICIENCY, FRACTURES): VIT D 25 HYDROXY: 69 ng/mL (ref 30–100)

## 2016-06-10 ENCOUNTER — Telehealth: Payer: Self-pay | Admitting: *Deleted

## 2016-06-10 NOTE — Telephone Encounter (Signed)
Received call from patient.   Inquired as to status of appointment for -ray and mammogram.   Please contact patient at [phone].

## 2016-06-13 NOTE — Telephone Encounter (Signed)
Someone did answer phone, took message for her to call back about appt for 06/17/16

## 2016-06-17 ENCOUNTER — Ambulatory Visit (HOSPITAL_COMMUNITY): Payer: PPO

## 2016-06-17 ENCOUNTER — Other Ambulatory Visit (HOSPITAL_COMMUNITY): Payer: PPO

## 2016-06-22 ENCOUNTER — Ambulatory Visit (HOSPITAL_COMMUNITY)
Admission: RE | Admit: 2016-06-22 | Discharge: 2016-06-22 | Disposition: A | Discharge status: Home / Self Care | Payer: PPO | Source: Ambulatory Visit | Attending: Family Medicine | Admitting: Family Medicine

## 2016-06-22 DIAGNOSIS — Z1231 Encounter for screening mammogram for malignant neoplasm of breast: Secondary | ICD-10-CM | POA: Diagnosis not present

## 2016-06-22 DIAGNOSIS — M85851 Other specified disorders of bone density and structure, right thigh: Secondary | ICD-10-CM | POA: Diagnosis not present

## 2016-06-22 DIAGNOSIS — M542 Cervicalgia: Secondary | ICD-10-CM | POA: Diagnosis not present

## 2016-06-22 DIAGNOSIS — Z1239 Encounter for other screening for malignant neoplasm of breast: Secondary | ICD-10-CM

## 2016-06-22 DIAGNOSIS — M47892 Other spondylosis, cervical region: Secondary | ICD-10-CM | POA: Insufficient documentation

## 2016-06-22 DIAGNOSIS — M5030 Other cervical disc degeneration, unspecified cervical region: Secondary | ICD-10-CM | POA: Diagnosis not present

## 2016-06-22 DIAGNOSIS — M810 Age-related osteoporosis without current pathological fracture: Secondary | ICD-10-CM | POA: Insufficient documentation

## 2016-06-22 DIAGNOSIS — Z780 Asymptomatic menopausal state: Secondary | ICD-10-CM | POA: Diagnosis not present

## 2016-06-23 ENCOUNTER — Other Ambulatory Visit: Payer: Self-pay | Admitting: *Deleted

## 2016-06-23 ENCOUNTER — Encounter: Payer: Self-pay | Admitting: *Deleted

## 2016-06-23 DIAGNOSIS — M5030 Other cervical disc degeneration, unspecified cervical region: Secondary | ICD-10-CM

## 2016-06-23 DIAGNOSIS — S149XXA Injury of unspecified nerves of neck, initial encounter: Secondary | ICD-10-CM

## 2016-06-23 MED ORDER — ZOSTER VACCINE LIVE 19400 UNT/0.65ML ~~LOC~~ SUSR: 0.6500 mL | Freq: Once | SUBCUTANEOUS | 0 refills | Status: AC

## 2016-06-23 MED ORDER — ALENDRONATE SODIUM 70 MG PO TABS: ORAL_TABLET | ORAL | 3 refills | Status: DC

## 2016-06-29 ENCOUNTER — Ambulatory Visit: Payer: PPO | Admitting: Orthopaedic Surgery

## 2016-07-06 ENCOUNTER — Ambulatory Visit: Payer: PPO | Admitting: Orthopaedic Surgery

## 2016-07-08 ENCOUNTER — Ambulatory Visit (HOSPITAL_COMMUNITY): Payer: PPO

## 2016-07-08 ENCOUNTER — Ambulatory Visit (HOSPITAL_COMMUNITY)
Admission: RE | Admit: 2016-07-08 | Discharge: 2016-07-08 | Disposition: A | Discharge status: Home / Self Care | Payer: PPO | Source: Ambulatory Visit | Attending: Family Medicine | Admitting: Family Medicine

## 2016-07-08 DIAGNOSIS — M4802 Spinal stenosis, cervical region: Secondary | ICD-10-CM | POA: Insufficient documentation

## 2016-07-08 DIAGNOSIS — M542 Cervicalgia: Secondary | ICD-10-CM | POA: Diagnosis not present

## 2016-07-08 DIAGNOSIS — M5030 Other cervical disc degeneration, unspecified cervical region: Secondary | ICD-10-CM

## 2016-07-08 DIAGNOSIS — G588 Other specified mononeuropathies: Secondary | ICD-10-CM | POA: Insufficient documentation

## 2016-07-08 DIAGNOSIS — M2578 Osteophyte, vertebrae: Secondary | ICD-10-CM | POA: Insufficient documentation

## 2016-07-08 DIAGNOSIS — S149XXA Injury of unspecified nerves of neck, initial encounter: Secondary | ICD-10-CM

## 2016-07-13 ENCOUNTER — Ambulatory Visit (INDEPENDENT_AMBULATORY_CARE_PROVIDER_SITE_OTHER): Payer: PPO | Admitting: Orthopaedic Surgery

## 2016-07-13 ENCOUNTER — Encounter: Payer: Self-pay | Admitting: Orthopaedic Surgery

## 2016-07-13 VITALS — BP 142/98 | HR 62 | Temp 97.5°F | Ht 64.0 in | Wt 148.0 lb

## 2016-07-13 DIAGNOSIS — M6530 Trigger finger, unspecified finger: Secondary | ICD-10-CM

## 2016-07-13 NOTE — Progress Notes (Signed)
Patient Rachel [name]:[id number] Mariea Stable, female DOB:[birth year], 71 y.o. [id number]  Chief Complaint  Patient presents with  . Follow-up    trigger finger, right long    HPI  Rachel [name] is a 71 y.o. female who has a trigger finger on the right long.  I saw her last February 2017 and injected the finger A1 pulley.  She did well for many months but the triggering is back and is really bothering her.  I have again recommended consideration of outpatient surgery and she has agreed.  I will have her see Dr. Aline Brochure for this. HPI  Body mass index is 25.4 kg/m.  ROS  Review of Systems  Constitutional:       Patient does not have Diabetes Mellitus. Patient has hypertension. Patient does not have COPD or shortness of breath. Patient does not have BMI > 35. Patient does not have current smoking history.  HENT: Positive for congestion.   Respiratory: Negative for cough and shortness of breath.   Cardiovascular: Negative for chest pain and leg swelling.  Endocrine: Negative for cold intolerance.  Musculoskeletal: Positive for arthralgias.  Allergic/Immunologic: Negative for environmental allergies.    Past Medical History:  Diagnosis Date  . Back pain   . Carpal tunnel syndrome   . Hypertension   . Low grade squamous intraepith lesion on cytologic smear cervix (lgsil)    +hpv, HAD HYSTERECTOMY  . OA (osteoarthritis)     Past Surgical History:  Procedure Laterality Date  . ABDOMINAL HYSTERECTOMY  2006  . BREAST CYST EXCISION     right  . BUNIONECTOMY     left  . CARPAL TUNNEL RELEASE    . ESOPHAGOGASTRODUODENOSCOPY  06/13/2012   Procedure: ESOPHAGOGASTRODUODENOSCOPY (EGD);  Surgeon: Rogene Houston, MD;  Location: [facility];  Service: Endoscopy;  Laterality: N/A;  200    Family History  Problem Relation Age of Onset  . Diabetes Father   . Cancer Father     prostate cancer  . Cancer Sister     breast  . Hypertension Son   . Cancer Maternal Grandmother   . Eczema  Daughter   . Other Daughter     stomach issues    Social History Social History  Substance Use Topics  . Smoking status: Former Smoker    Packs/day: 0.25    Years: 50.00    Types: Cigarettes    Quit date: 07/28/2013  . Smokeless tobacco: Never Used  . Alcohol use 0.0 oz/week     Comment: occ    No Known Allergies  Current Outpatient Prescriptions  Medication Sig Dispense Refill  . acidophilus (RISAQUAD) CAPS capsule Take by mouth daily.    Marland Kitchen alendronate (FOSAMAX) 70 MG tablet Take (1) tab by mouth every 7 days. Take with a full glass of water on an empty stomach. 12 tablet 3  . Cholecalciferol (VITAMIN D) 2000 UNITS tablet Take 2,000 Units by mouth daily.    . Multiple Vitamin (MULTIVITAMIN WITH MINERALS) TABS tablet Take 1 tablet by mouth daily.    . pantoprazole (PROTONIX) 40 MG tablet Take 1 tablet (40 mg total) by mouth daily. 90 tablet 2  . polyethylene glycol powder (GLYCOLAX/MIRALAX) powder Take 17 g by mouth 2 (two) times daily as needed. 10050 g 1  . propranolol (INDERAL) 20 MG tablet Take 1 tablet (20 mg total) by mouth 2 (two) times daily. 180 tablet 2  . Pseudoephedrine-APAP-DM (DAYQUIL PO) Take by mouth as needed.    . triamcinolone cream (KENALOG)  0.1 % Apply to affected areas 2x daily as needed. 453.6 g 3   No current facility-administered medications for this visit.      Physical Exam  Blood pressure (!) 142/98, pulse 62, temperature 97.5 F (36.4 C), height 5\' 4"  (1.626 m), weight 148 lb (67.1 kg).  Constitutional: overall normal hygiene, normal nutrition, well developed, normal grooming, normal body habitus. Assistive device:none  Musculoskeletal: gait and station Limp none, muscle tone and strength are normal, no tremors or atrophy is present.  .  Neurological: coordination overall normal.  Deep tendon reflex/nerve stretch intact.  Sensation normal.  Cranial nerves II-XII intact.   Skin:   Normal overall no scars, lesions, ulcers or rashes. No  psoriasis.  Psychiatric: Alert and oriented x 3.  Recent memory intact, remote memory unclear.  Normal mood and affect. Well groomed.  Good eye contact.  Cardiovascular: overall no swelling, no varicosities, no edema bilaterally, normal temperatures of the legs and arms, no clubbing, cyanosis and good capillary refill.  Lymphatic: palpation is normal.  She has locking of the right dominant long finger at the A1 pulley.  NV is intact.  The patient has been educated about the nature of the problem(s) and counseled on treatment options.  The patient appeared to understand what I have discussed and is in agreement with it.  Encounter Diagnosis  Name Primary?  . Trigger finger, acquired Yes    PLAN Call if any problems.  Precautions discussed.  Continue current medications.   Return to clinic See Dr. Aline Brochure for surgery evaluation.   Electronically Signed Sanjuana Kava, MD 2/14/20183:30 PM

## 2016-07-15 ENCOUNTER — Other Ambulatory Visit: Payer: Self-pay | Admitting: *Deleted

## 2016-07-15 DIAGNOSIS — M5030 Other cervical disc degeneration, unspecified cervical region: Secondary | ICD-10-CM

## 2016-07-15 DIAGNOSIS — M47819 Spondylosis without myelopathy or radiculopathy, site unspecified: Secondary | ICD-10-CM

## 2016-07-22 ENCOUNTER — Other Ambulatory Visit: Payer: Self-pay | Admitting: Family Medicine

## 2016-07-22 DIAGNOSIS — M47819 Spondylosis without myelopathy or radiculopathy, site unspecified: Secondary | ICD-10-CM

## 2016-07-22 DIAGNOSIS — M5030 Other cervical disc degeneration, unspecified cervical region: Secondary | ICD-10-CM

## 2016-07-25 ENCOUNTER — Other Ambulatory Visit: Payer: Self-pay | Admitting: Neurology

## 2016-07-25 DIAGNOSIS — M47819 Spondylosis without myelopathy or radiculopathy, site unspecified: Secondary | ICD-10-CM

## 2016-07-25 DIAGNOSIS — M5030 Other cervical disc degeneration, unspecified cervical region: Secondary | ICD-10-CM

## 2016-07-27 ENCOUNTER — Ambulatory Visit (INDEPENDENT_AMBULATORY_CARE_PROVIDER_SITE_OTHER): Payer: PPO | Admitting: Orthopedic Surgery

## 2016-07-27 ENCOUNTER — Encounter: Payer: Self-pay | Admitting: Orthopedic Surgery

## 2016-07-27 VITALS — BP 184/110 | HR 62 | Ht 64.5 in | Wt 149.0 lb

## 2016-07-27 DIAGNOSIS — M6530 Trigger finger, unspecified finger: Secondary | ICD-10-CM

## 2016-07-27 NOTE — Progress Notes (Signed)
Patient ID: Rachel [name], female   DOB: [birth year], 71 y.o.   MRN: [id number]  Chief Complaint  Patient presents with  . Hand Problem    RIGHT LONG TRIGGER FINGER    HPI Rachel [name] is a 71 y.o. female.  Presents for evaluation of the right long finger which is been triggering now and causing her pain and loss of function including activities of daily living with fine motor tasks. Pain is over the A1 pulley it's constant and it's worse when she's bending and straightening her finger and she demonstrates a locking  Review of Systems Review of Systems  Respiratory: Negative for shortness of breath.   Cardiovascular: Negative for chest pain.   (2 MINIMUM)  Past Medical History:  Diagnosis Date  . Back pain   . Carpal tunnel syndrome   . Hypertension   . Low grade squamous intraepith lesion on cytologic smear cervix (lgsil)    +hpv, HAD HYSTERECTOMY  . OA (osteoarthritis)     Past Surgical History:  Procedure Laterality Date  . ABDOMINAL HYSTERECTOMY  2006  . BREAST CYST EXCISION     right  . BUNIONECTOMY     left  . CARPAL TUNNEL RELEASE    . ESOPHAGOGASTRODUODENOSCOPY  06/13/2012   Procedure: ESOPHAGOGASTRODUODENOSCOPY (EGD);  Surgeon: Rogene Houston, MD;  Location: [facility];  Service: Endoscopy;  Laterality: N/A;  200    Social History Social History  Substance Use Topics  . Smoking status: Former Smoker    Packs/day: 0.25    Years: 50.00    Types: Cigarettes    Quit date: 07/28/2013  . Smokeless tobacco: Never Used  . Alcohol use 0.0 oz/week     Comment: occ    No Known Allergies  Current Meds  Medication Sig  . acidophilus (RISAQUAD) CAPS capsule Take by mouth daily.  Marland Kitchen alendronate (FOSAMAX) 70 MG tablet Take (1) tab by mouth every 7 days. Take with a full glass of water on an empty stomach.  . Cholecalciferol (VITAMIN D) 2000 UNITS tablet Take 2,000 Units by mouth daily.  . Multiple Vitamin (MULTIVITAMIN WITH MINERALS) TABS tablet Take 1 tablet by  mouth daily.  . pantoprazole (PROTONIX) 40 MG tablet Take 1 tablet (40 mg total) by mouth daily.  . polyethylene glycol powder (GLYCOLAX/MIRALAX) powder Take 17 g by mouth 2 (two) times daily as needed.  . propranolol (INDERAL) 20 MG tablet Take 1 tablet (20 mg total) by mouth 2 (two) times daily.  . Pseudoephedrine-APAP-DM (DAYQUIL PO) Take by mouth as needed.  . triamcinolone cream (KENALOG) 0.1 % Apply to affected areas 2x daily as needed.      Physical Exam Physical Exam 1.BP (!) 184/110   Pulse 62   Ht 5' 4.5" (1.638 m)   Wt 149 lb (67.6 kg)   BMI 25.18 kg/m   2. Gen. appearance. The patient is well-developed and well-nourished, grooming and hygiene are normal. There are no gross congenital abnormalities  3. The patient is alert and oriented to person place and time  4. Mood and affect are normal  5. Ambulation NORMAL Examination reveals the following: 6. On inspection we find catching and locking tenderness over the A1 pulley she can fully extend the finger has trouble bending and all the way. Metacarpophalangeal joints are stable in flexion tendons are strong skin is intact pulses are good capillary refill is normal    MEDICAL DECISION MAKING:    Data Reviewed DR Briscoe Burns NOTES   Assessment Encounter  Diagnosis  Name Primary?  . Trigger finger, acquired, LEFT LONG FINGER  Yes     Plan LEFT LONG FINGER TRIGGER RELEASE   Arther Abbott 07/27/2016, 10:27 AM

## 2016-07-27 NOTE — Addendum Note (Signed)
Addended by: Baldomero Lamy B on: 07/27/2016 11:05 AM   Modules accepted: Orders, SmartSet

## 2016-07-27 NOTE — Patient Instructions (Addendum)
SURGERY 08/25/16  Trigger Finger Trigger finger (stenosing tenosynovitis) is a condition that causes a finger to get stuck in a bent position. Each finger has a tough, cord-like tissue that connects muscle to bone (tendon), and each tendon is surrounded by a tunnel of tissue (tendon sheath). To move your finger, your tendon needs to slide freely through the sheath. Trigger finger happens when the tendon or the sheath thickens, making it difficult to move your finger. Trigger finger can affect any finger or a thumb. It may affect more than one finger. Mild cases may clear up with rest and medicine. Severe cases require more treatment. What are the causes? Trigger finger is caused by a thickened finger tendon or tendon sheath. The cause of this thickening is not known. What increases the risk? The following factors may make you more likely to develop this condition:  Doing activities that require a strong grip.  Having rheumatoid arthritis, gout, or diabetes.  Being 79-49 years old.  Being a woman. What are the signs or symptoms? Symptoms of this condition include:  Pain when bending or straightening your finger.  Tenderness or swelling where your finger attaches to the palm of your hand.  A lump in the palm of your hand or on the inside of your finger.  Hearing a popping sound when you try to straighten your finger.  Feeling a popping, catching, or locking sensation when you try to straighten your finger.  Being unable to straighten your finger. How is this diagnosed? This condition is diagnosed based on your symptoms and a physical exam. How is this treated? This condition may be treated by:  Resting your finger and avoiding activities that make symptoms worse.  Wearing a finger splint to keep your finger in a slightly bent position.  Taking NSAIDs to relieve pain and swelling.  Injecting medicine (steroids) into the tendon sheath to reduce swelling and irritation. Injections  may need to be repeated.  Having surgery to open the tendon sheath. This may be done if other treatments do not work and you cannot straighten your finger. You may need physical therapy after surgery. Follow these instructions at home:  Use moist heat to help reduce pain and swelling as told by your health care provider.  Rest your finger and avoid activities that make pain worse. Return to normal activities as told by your health care provider.  If you have a splint, wear it as told by your health care provider.  Take over-the-counter and prescription medicines only as told by your health care provider.  Keep all follow-up visits as told by your health care provider. This is important. Contact a health care provider if:  Your symptoms are not improving with home care. Summary  Trigger finger (stenosing tenosynovitis) causes your finger to get stuck in a bent position, and it can make it difficult and painful to straighten your finger.  This condition develops when a finger tendon or tendon sheath thickens.  Treatment starts with resting, wearing a splint, and taking NSAIDs.  In severe cases, surgery to open the tendon sheath may be needed. This information is not intended to replace advice given to you by your health care provider. Make sure you discuss any questions you have with your health care provider. Document Released: 03/05/2004 Document Revised: 04/26/2016 Document Reviewed: 04/26/2016 Elsevier Interactive Patient Education  2017 [employer].

## 2016-08-01 ENCOUNTER — Other Ambulatory Visit: Payer: PPO

## 2016-08-05 ENCOUNTER — Other Ambulatory Visit: Payer: Self-pay | Admitting: Family Medicine

## 2016-08-05 DIAGNOSIS — M5030 Other cervical disc degeneration, unspecified cervical region: Secondary | ICD-10-CM

## 2016-08-05 DIAGNOSIS — M47819 Spondylosis without myelopathy or radiculopathy, site unspecified: Secondary | ICD-10-CM

## 2016-08-09 ENCOUNTER — Ambulatory Visit
Admission: RE | Admit: 2016-08-09 | Discharge: 2016-08-09 | Disposition: A | Discharge status: Home / Self Care | Payer: PPO | Source: Ambulatory Visit | Attending: Neurology | Admitting: Neurology

## 2016-08-09 DIAGNOSIS — M5030 Other cervical disc degeneration, unspecified cervical region: Secondary | ICD-10-CM

## 2016-08-09 DIAGNOSIS — M47819 Spondylosis without myelopathy or radiculopathy, site unspecified: Secondary | ICD-10-CM

## 2016-08-09 DIAGNOSIS — M47812 Spondylosis without myelopathy or radiculopathy, cervical region: Secondary | ICD-10-CM | POA: Diagnosis not present

## 2016-08-09 MED ORDER — TRIAMCINOLONE ACETONIDE 40 MG/ML IJ SUSP (RADIOLOGY)
60.0000 mg | Freq: Once | INTRAMUSCULAR | Status: AC
  Administered 2016-08-09: 60 mg via EPIDURAL

## 2016-08-09 MED ORDER — IOPAMIDOL (ISOVUE-M 300) INJECTION 61%
1.0000 mL | Freq: Once | INTRAMUSCULAR | Status: AC | PRN
  Administered 2016-08-09: 1 mL via EPIDURAL

## 2016-08-09 NOTE — Discharge Instructions (Signed)

## 2016-08-15 ENCOUNTER — Encounter: Payer: Self-pay | Admitting: *Deleted

## 2016-08-15 NOTE — Progress Notes (Signed)
[place], [place] Export IS APPROVED AUTH [id number]

## 2016-08-18 NOTE — Patient Instructions (Signed)
Rachel [name]  08/18/2016     @PREFPERIOPPHARMACY @   Your procedure is scheduled on 08/25/2016  Report to [facility] at [place].M.  Call this number if you have problems the morning of surgery:  [phone]   Remember:  Do not eat food or drink liquids after midnight.  Take these medicines the morning of surgery with A SIP OF WATER Protonix, Inderal   Do not wear jewelry, make-up or nail polish.  Do not wear lotions, powders, or perfumes, or deoderant.  Do not shave 48 hours prior to surgery.  Men may shave face and neck.  Do not bring valuables to the hospital.  [facility] is not responsible for any belongings or valuables.  Contacts, dentures or bridgework may not be worn into surgery.  Leave your suitcase in the car.  After surgery it may be brought to your room.  For patients admitted to the hospital, discharge time will be determined by your treatment team.  Patients discharged the day of surgery will not be allowed to drive home.    Please read over the following fact sheets that you were given. Surgical Site Infection Prevention and Anesthesia Post-op Instructions       PATIENT INSTRUCTIONS POST-ANESTHESIA  IMMEDIATELY FOLLOWING SURGERY:  Do not drive or operate machinery for the first twenty four hours after surgery.  Do not make any important decisions for twenty four hours after surgery or while taking narcotic pain medications or sedatives.  If you develop intractable nausea and vomiting or a severe headache please notify your doctor immediately.  FOLLOW-UP:  Please make an appointment with your surgeon as instructed. You do not need to follow up with anesthesia unless specifically instructed to do so.  WOUND CARE INSTRUCTIONS (if applicable):  Keep a dry clean dressing on the anesthesia/puncture wound site if there is drainage.  Once the wound has quit draining you may leave it open to air.  Generally you should leave the bandage intact for twenty four hours  unless there is drainage.  If the epidural site drains for more than 36-48 hours please call the anesthesia department.  QUESTIONS?:  Please feel free to call your physician or the hospital operator if you have any questions, and they will be happy to assist you.      Carpal Tunnel Release Carpal tunnel release is a surgical procedure to relieve numbness and pain in your hand that are caused by carpal tunnel syndrome. Your carpal tunnel is a narrow, hollow space in your wrist. It passes between your wrist bones and a band of connective tissue (transverse carpal ligament). The nerve that supplies most of your hand (median nerve) passes through this space, and so do the connections between your fingers and the muscles of your arm (tendons). Carpal tunnel syndrome makes this space swell and become narrow, and this causes pain and numbness. In carpal tunnel release surgery, a surgeon cuts through the transverse carpal ligament to make more room in the carpal tunnel space. You may have this surgery if other types of treatment have not worked. Tell a health care provider about:  Any allergies you have.  All medicines you are taking, including vitamins, herbs, eye drops, creams, and over-the-counter medicines.  Any problems you or family members have had with anesthetic medicines.  Any blood disorders you have.  Any surgeries you have had.  Any medical conditions you have. What are the risks? Generally, this is a safe procedure. However, problems may occur, including:  Bleeding.  Infection.  Injury to the median nerve.  Need for additional surgery. What happens before the procedure?  Ask your health care provider about:  Changing or stopping your regular medicines. This is especially important if you are taking diabetes medicines or blood thinners.  Taking medicines such as aspirin and ibuprofen. These medicines can thin your blood. Do not take these medicines before your procedure if  your health care provider instructs you not to.  Do not eat or drink anything after midnight on the night before the procedure or as directed by your health care provider.  Plan to have someone take you home after the procedure. What happens during the procedure?  An IV tube may be inserted into a vein.  You will be given one of the following:  A medicine that numbs the wrist area (local anesthetic). You may also be given a medicine to make you relax (sedative).  A medicine that makes you go to sleep (general anesthetic).  Your arm, hand, and wrist will be cleaned with a germ-killing solution (antiseptic).  Your surgeon will make a surgical cut (incision) over the palm side of your wrist. The surgeon will pull aside the skin of your wrist to expose the carpal tunnel space.  The surgeon will cut the transverse carpal ligament.  The edges of the incision will be closed with stitches (sutures) or staples.  A bandage (dressing) will be placed over your wrist and wrapped around your hand and wrist. What happens after the procedure?  You may spend some time in a recovery area.  Your blood pressure, heart rate, breathing rate, and blood oxygen level will be monitored often until the medicines you were given have worn off.  You will likely have some pain. You will be given pain medicine.  You may need to wear a splint or a wrist brace over your dressing. This information is not intended to replace advice given to you by your health care provider. Make sure you discuss any questions you have with your health care provider. Document Released: 08/06/2003 Document Revised: 10/22/2015 Document Reviewed: 01/01/2014 Elsevier Interactive Patient Education  2017 [employer].

## 2016-08-22 ENCOUNTER — Encounter (HOSPITAL_COMMUNITY)
Admission: RE | Admit: 2016-08-22 | Discharge: 2016-08-22 | Disposition: A | Discharge status: Home / Self Care | Payer: PPO | Source: Ambulatory Visit | Attending: Orthopedic Surgery | Admitting: Orthopedic Surgery

## 2016-08-23 ENCOUNTER — Encounter (HOSPITAL_COMMUNITY)
Admission: RE | Admit: 2016-08-23 | Discharge: 2016-08-23 | Disposition: A | Discharge status: Home / Self Care | Payer: PPO | Source: Ambulatory Visit | Attending: Orthopedic Surgery | Admitting: Orthopedic Surgery

## 2016-08-23 ENCOUNTER — Encounter (HOSPITAL_COMMUNITY): Payer: Self-pay

## 2016-08-23 DIAGNOSIS — Z87891 Personal history of nicotine dependence: Secondary | ICD-10-CM | POA: Diagnosis not present

## 2016-08-23 DIAGNOSIS — M1990 Unspecified osteoarthritis, unspecified site: Secondary | ICD-10-CM | POA: Diagnosis not present

## 2016-08-23 DIAGNOSIS — I10 Essential (primary) hypertension: Secondary | ICD-10-CM | POA: Diagnosis not present

## 2016-08-23 DIAGNOSIS — M65331 Trigger finger, right middle finger: Secondary | ICD-10-CM | POA: Diagnosis not present

## 2016-08-23 DIAGNOSIS — Z90710 Acquired absence of both cervix and uterus: Secondary | ICD-10-CM | POA: Diagnosis not present

## 2016-08-23 LAB — CBC WITH DIFFERENTIAL/PLATELET
Basophils Absolute: 0.1 10*3/uL (ref 0.0–0.1)
Basophils Relative: 1 %
EOS PCT: 2 %
Eosinophils Absolute: 0.1 10*3/uL (ref 0.0–0.7)
HCT: 43.5 % (ref 36.0–46.0)
HEMOGLOBIN: 14.3 g/dL (ref 12.0–15.0)
LYMPHS PCT: 27 %
Lymphs Abs: 1.4 10*3/uL (ref 0.7–4.0)
MCH: 29 pg (ref 26.0–34.0)
MCHC: 32.9 g/dL (ref 30.0–36.0)
MCV: 88.2 fL (ref 78.0–100.0)
Monocytes Absolute: 0.6 10*3/uL (ref 0.1–1.0)
Monocytes Relative: 11 %
NEUTROS PCT: 59 %
Neutro Abs: 2.8 10*3/uL (ref 1.7–7.7)
PLATELETS: 210 10*3/uL (ref 150–400)
RBC: 4.93 MIL/uL (ref 3.87–5.11)
RDW: 14.5 % (ref 11.5–15.5)
WBC: 4.9 10*3/uL (ref 4.0–10.5)

## 2016-08-23 LAB — BASIC METABOLIC PANEL
Anion gap: 8 (ref 5–15)
BUN: 17 mg/dL (ref 6–20)
CHLORIDE: 103 mmol/L (ref 101–111)
CO2: 27 mmol/L (ref 22–32)
Calcium: 10.7 mg/dL — ABNORMAL HIGH (ref 8.9–10.3)
Creatinine, Ser: 1.09 mg/dL — ABNORMAL HIGH (ref 0.44–1.00)
GFR calc Af Amer: 58 mL/min — ABNORMAL LOW (ref >60)
GFR, EST NON AFRICAN AMERICAN: 50 mL/min — AB (ref >60)
GLUCOSE: 133 mg/dL — AB (ref 65–99)
Potassium: 4.2 mmol/L (ref 3.5–5.1)
Sodium: 138 mmol/L (ref 135–145)

## 2016-08-24 NOTE — H&P (Signed)
Patient ID: Rachel [name], female   DOB: [birth year], 71 y.o.   MRN: [id number]   Chief Complaint  Patient presents with  . Hand Problem      RIGHT LONG TRIGGER FINGER      HPI Rachel [name] is a 71 y.o. female.  Presents for evaluation of the right long finger which is been triggering now and causing her pain and loss of function including activities of daily living with fine motor tasks. Pain is over the A1 pulley it's constant and it's worse when she's bending and straightening her finger and she demonstrates a locking   Review of Systems Review of Systems  Respiratory: Negative for shortness of breath.   Cardiovascular: Negative for chest pain.    (2 MINIMUM)       Past Medical History:  Diagnosis Date  . Back pain    . Carpal tunnel syndrome    . Hypertension    . Low grade squamous intraepith lesion on cytologic smear cervix (lgsil)      +hpv, HAD HYSTERECTOMY  . OA (osteoarthritis)      Family History  Problem Relation Age of Onset  . Diabetes Father   . Cancer Father     prostate cancer  . Cancer Sister     breast  . Hypertension Son   . Cancer Maternal Grandmother   . Eczema Daughter   . Other Daughter     stomach issues           Past Surgical History:  Procedure Laterality Date  . ABDOMINAL HYSTERECTOMY   2006  . BREAST CYST EXCISION        right  . BUNIONECTOMY        left  . CARPAL TUNNEL RELEASE      . ESOPHAGOGASTRODUODENOSCOPY   06/13/2012    Procedure: ESOPHAGOGASTRODUODENOSCOPY (EGD);  Surgeon: Rogene Houston, MD;  Location: [facility];  Service: Endoscopy;  Laterality: N/A;  200      Social History        Social History  Substance Use Topics  . Smoking status: Former Smoker      Packs/day: 0.25      Years: 50.00      Types: Cigarettes      Quit date: 07/28/2013  . Smokeless tobacco: Never Used  . Alcohol use 0.0 oz/week         Comment: occ      No Known Allergies   Active Medications      Current Meds  Medication Sig   . acidophilus (RISAQUAD) CAPS capsule Take by mouth daily.  Marland Kitchen alendronate (FOSAMAX) 70 MG tablet Take (1) tab by mouth every 7 days. Take with a full glass of water on an empty stomach.  . Cholecalciferol (VITAMIN D) 2000 UNITS tablet Take 2,000 Units by mouth daily.  . Multiple Vitamin (MULTIVITAMIN WITH MINERALS) TABS tablet Take 1 tablet by mouth daily.  . pantoprazole (PROTONIX) 40 MG tablet Take 1 tablet (40 mg total) by mouth daily.  . polyethylene glycol powder (GLYCOLAX/MIRALAX) powder Take 17 g by mouth 2 (two) times daily as needed.  . propranolol (INDERAL) 20 MG tablet Take 1 tablet (20 mg total) by mouth 2 (two) times daily.  . Pseudoephedrine-APAP-DM (DAYQUIL PO) Take by mouth as needed.  . triamcinolone cream (KENALOG) 0.1 % Apply to affected areas 2x daily as needed.            Physical Exam Physical Exam 1.BP (!) 184/110  Pulse 62   Ht 5' 4.5" (1.638 m)   Wt 149 lb (67.6 kg)   BMI 25.18 kg/m    2. Gen. appearance. The patient is well-developed and well-nourished, grooming and hygiene are normal. There are no gross congenital abnormalities   3. The patient is alert and oriented to person place and time   4. Mood and affect are normal   5. Ambulation NORMAL Examination reveals the following: 6. On inspection we find catching and locking tenderness over the A1 pulley she can fully extend the finger has trouble bending and all the way. Metacarpophalangeal joints are stable in flexion tendons are strong skin is intact pulses are good capillary refill is normal       MEDICAL DECISION MAKING:    Data Reviewed DR Briscoe Burns NOTES    Assessment     Encounter Diagnosis  Name Primary?  . Trigger finger, acquired, LEFT LONG FINGER  Yes        Plan LEFT LONG FINGER TRIGGER RELEASE    Arther Abbott

## 2016-08-25 ENCOUNTER — Encounter (HOSPITAL_COMMUNITY): Payer: Self-pay | Admitting: *Deleted

## 2016-08-25 ENCOUNTER — Ambulatory Visit (HOSPITAL_COMMUNITY)
Admission: RE | Admit: 2016-08-25 | Discharge: 2016-08-25 | Disposition: A | Discharge status: Home / Self Care | Payer: PPO | Source: Ambulatory Visit | Attending: Orthopedic Surgery | Admitting: Orthopedic Surgery

## 2016-08-25 ENCOUNTER — Encounter (HOSPITAL_COMMUNITY)
Admission: RE | Disposition: A | Discharge status: Home / Self Care | Payer: Self-pay | Source: Ambulatory Visit | Attending: Orthopedic Surgery

## 2016-08-25 ENCOUNTER — Ambulatory Visit (HOSPITAL_COMMUNITY): Payer: PPO | Admitting: Anesthesiology

## 2016-08-25 DIAGNOSIS — M65331 Trigger finger, right middle finger: Secondary | ICD-10-CM | POA: Insufficient documentation

## 2016-08-25 DIAGNOSIS — Z90710 Acquired absence of both cervix and uterus: Secondary | ICD-10-CM | POA: Insufficient documentation

## 2016-08-25 DIAGNOSIS — M1990 Unspecified osteoarthritis, unspecified site: Secondary | ICD-10-CM | POA: Diagnosis not present

## 2016-08-25 DIAGNOSIS — M6530 Trigger finger, unspecified finger: Secondary | ICD-10-CM | POA: Diagnosis not present

## 2016-08-25 DIAGNOSIS — I10 Essential (primary) hypertension: Secondary | ICD-10-CM | POA: Insufficient documentation

## 2016-08-25 DIAGNOSIS — Z87891 Personal history of nicotine dependence: Secondary | ICD-10-CM | POA: Insufficient documentation

## 2016-08-25 DIAGNOSIS — M65341 Trigger finger, right ring finger: Secondary | ICD-10-CM | POA: Diagnosis not present

## 2016-08-25 HISTORY — PX: TRIGGER FINGER RELEASE: SHX641

## 2016-08-25 SURGERY — RELEASE, A1 PULLEY, FOR TRIGGER FINGER
Anesthesia: Regional | Site: Hand | Laterality: Right

## 2016-08-25 MED ORDER — CEFAZOLIN SODIUM-DEXTROSE 2-4 GM/100ML-% IV SOLN
INTRAVENOUS | Status: AC
  Filled 2016-08-25: qty 100

## 2016-08-25 MED ORDER — LIDOCAINE HCL (PF) 0.5 % IJ SOLN
INTRAMUSCULAR | Status: DC | PRN
Start: 2016-08-25 — End: 2016-08-25
  Administered 2016-08-25: 40 mL

## 2016-08-25 MED ORDER — BUPIVACAINE HCL (PF) 0.5 % IJ SOLN
INTRAMUSCULAR | Status: AC
  Filled 2016-08-25: qty 30

## 2016-08-25 MED ORDER — BUPIVACAINE HCL (PF) 0.5 % IJ SOLN
INTRAMUSCULAR | Status: DC | PRN
  Administered 2016-08-25: 10 mL

## 2016-08-25 MED ORDER — FENTANYL CITRATE (PF) 100 MCG/2ML IJ SOLN
INTRAMUSCULAR | Status: AC
  Filled 2016-08-25: qty 2

## 2016-08-25 MED ORDER — CEFAZOLIN SODIUM-DEXTROSE 2-4 GM/100ML-% IV SOLN
2.0000 g | INTRAVENOUS | Status: AC
  Administered 2016-08-25: 2 g via INTRAVENOUS

## 2016-08-25 MED ORDER — PROPOFOL 10 MG/ML IV BOLUS
INTRAVENOUS | Status: AC
  Filled 2016-08-25: qty 40

## 2016-08-25 MED ORDER — MIDAZOLAM HCL 5 MG/5ML IJ SOLN
INTRAMUSCULAR | Status: DC | PRN
  Administered 2016-08-25 (×2): 1 mg via INTRAVENOUS

## 2016-08-25 MED ORDER — CHLORHEXIDINE GLUCONATE 4 % EX LIQD: 60.0000 mL | Freq: Once | CUTANEOUS | Status: DC

## 2016-08-25 MED ORDER — FENTANYL CITRATE (PF) 100 MCG/2ML IJ SOLN: 25.0000 ug | INTRAMUSCULAR | Status: DC | PRN

## 2016-08-25 MED ORDER — ACETAMINOPHEN-CODEINE #3 300-30 MG PO TABS: 1.0000 | ORAL_TABLET | Freq: Four times a day (QID) | ORAL | 0 refills | Status: DC | PRN

## 2016-08-25 MED ORDER — MIDAZOLAM HCL 2 MG/2ML IJ SOLN
INTRAMUSCULAR | Status: AC
  Filled 2016-08-25: qty 2

## 2016-08-25 MED ORDER — LACTATED RINGERS IV SOLN
INTRAVENOUS | Status: DC
  Administered 2016-08-25: 1000 mL via INTRAVENOUS

## 2016-08-25 MED ORDER — PROPOFOL 500 MG/50ML IV EMUL
INTRAVENOUS | Status: DC | PRN
  Administered 2016-08-25: 50 ug/kg/min via INTRAVENOUS

## 2016-08-25 MED ORDER — FENTANYL CITRATE (PF) 100 MCG/2ML IJ SOLN
25.0000 ug | INTRAMUSCULAR | Status: AC
  Administered 2016-08-25: 25 ug via INTRAVENOUS

## 2016-08-25 MED ORDER — LIDOCAINE HCL (PF) 0.5 % IJ SOLN
INTRAMUSCULAR | Status: AC
  Filled 2016-08-25: qty 50

## 2016-08-25 MED ORDER — SODIUM CHLORIDE 0.9 % IR SOLN
Status: DC | PRN
  Administered 2016-08-25: 500 mL

## 2016-08-25 MED ORDER — MIDAZOLAM HCL 2 MG/2ML IJ SOLN
1.0000 mg | INTRAMUSCULAR | Status: AC
Start: 2016-08-25 — End: 2016-08-25
  Administered 2016-08-25: 2 mg via INTRAVENOUS

## 2016-08-25 SURGICAL SUPPLY — 43 items
BAG HAMPER (MISCELLANEOUS) ×2 IMPLANT
BANDAGE ELASTIC 2 LF NS (GAUZE/BANDAGES/DRESSINGS) ×2 IMPLANT
BANDAGE ESMARK 4X12 BL STRL LF (DISPOSABLE) IMPLANT
BLADE SURG 15 STRL LF DISP TIS (BLADE) ×1 IMPLANT
BLADE SURG 15 STRL SS (BLADE) ×2
BNDG CMPR 12X4 ELC STRL LF (DISPOSABLE)
BNDG CMPR MD 5X2 ELC HKLP STRL (GAUZE/BANDAGES/DRESSINGS) ×1
BNDG CMPR MED 5X2 ELC HKLP NS (GAUZE/BANDAGES/DRESSINGS) ×1
BNDG CONFORM 2 STRL LF (GAUZE/BANDAGES/DRESSINGS) ×1 IMPLANT
BNDG ELASTIC 2 VLCR STRL LF (GAUZE/BANDAGES/DRESSINGS) ×1 IMPLANT
BNDG ESMARK 4X12 BLUE STRL LF (DISPOSABLE)
CHLORAPREP W/TINT 26ML (MISCELLANEOUS) ×2 IMPLANT
CLOTH BEACON ORANGE TIMEOUT ST (SAFETY) ×2 IMPLANT
COVER LIGHT HANDLE STERIS (MISCELLANEOUS) ×4 IMPLANT
CUFF TOURNIQUET SINGLE 18IN (TOURNIQUET CUFF) ×2 IMPLANT
CUFF TOURNIQUET SINGLE 24IN (TOURNIQUET CUFF) IMPLANT
DECANTER SPIKE VIAL GLASS SM (MISCELLANEOUS) ×2 IMPLANT
DRSG XEROFORM 1X8 (GAUZE/BANDAGES/DRESSINGS) ×1 IMPLANT
ELECT NDL TIP 2.8 STRL (NEEDLE) ×1 IMPLANT
ELECT NEEDLE TIP 2.8 STRL (NEEDLE) ×2 IMPLANT
ELECT REM PT RETURN 9FT ADLT (ELECTROSURGICAL) ×2
ELECTRODE REM PT RTRN 9FT ADLT (ELECTROSURGICAL) ×1 IMPLANT
GAUZE SPONGE 4X4 12PLY STRL (GAUZE/BANDAGES/DRESSINGS) ×1 IMPLANT
GLOVE BIOGEL PI IND STRL 7.0 (GLOVE) ×1 IMPLANT
GLOVE BIOGEL PI INDICATOR 7.0 (GLOVE) ×1
GLOVE SKINSENSE NS SZ8.0 LF (GLOVE) ×1
GLOVE SKINSENSE STRL SZ8.0 LF (GLOVE) ×1 IMPLANT
GLOVE SS N UNI LF 8.5 STRL (GLOVE) ×2 IMPLANT
GOWN STRL REUS W/ TWL LRG LVL3 (GOWN DISPOSABLE) ×1 IMPLANT
GOWN STRL REUS W/TWL LRG LVL3 (GOWN DISPOSABLE) ×6 IMPLANT
GOWN STRL REUS W/TWL XL LVL3 (GOWN DISPOSABLE) ×2 IMPLANT
HAND ALUMI XLG (SOFTGOODS) ×2 IMPLANT
KIT ROOM TURNOVER APOR (KITS) ×2 IMPLANT
MANIFOLD NEPTUNE II (INSTRUMENTS) ×2 IMPLANT
NDL HYPO 21X1.5 SAFETY (NEEDLE) IMPLANT
NEEDLE HYPO 21X1.5 SAFETY (NEEDLE) IMPLANT
NS IRRIG 1000ML POUR BTL (IV SOLUTION) ×2 IMPLANT
PACK BASIC LIMB (CUSTOM PROCEDURE TRAY) ×2 IMPLANT
PAD ARMBOARD 7.5X6 YLW CONV (MISCELLANEOUS) ×2 IMPLANT
SET BASIN LINEN APH (SET/KITS/TRAYS/PACK) ×2 IMPLANT
SPONGE GAUZE 2X2 8PLY STRL LF (GAUZE/BANDAGES/DRESSINGS) ×1 IMPLANT
SUT ETHILON 3 0 FSL (SUTURE) ×2 IMPLANT
SYR CONTROL 10ML LL (SYRINGE) IMPLANT

## 2016-08-25 NOTE — Addendum Note (Signed)
Addendum  created 08/25/16 0847 by Mickel Baas, CRNA   Anesthesia Intra Blocks edited, Child order released for a procedure order, Sign clinical note

## 2016-08-25 NOTE — Interval H&P Note (Signed)
History and Physical Interval Note:  08/25/2016 7:15 AM  BP (!) 143/98   Pulse 68   Temp 98.2 F (36.8 C) (Oral)   Resp 14   Ht 5' 4.5" (1.638 m)   Wt 147 lb (66.7 kg)   SpO2 99%   BMI 24.84 kg/m   Skin check normal  Rachel [name]  has presented today for surgery, with the diagnosis of RIGHT LONG TRIGGER FINGER  The various methods of treatment have been discussed with the patient and family. After consideration of risks, benefits and other options for treatment, the patient has consented to  Procedure(s): RELEASE TRIGGER FINGER/A-1 PULLEY RIGHT LONG TRIGGER FINGER RELEASE (Right) as a surgical intervention .  The patient's history has been reviewed, patient examined, no change in status, stable for surgery.  I have reviewed the patient's chart and labs.  Questions were answered to the patient's satisfaction.     Arther Abbott

## 2016-08-25 NOTE — Anesthesia Preprocedure Evaluation (Signed)
Anesthesia Evaluation  Patient identified by MRN, date of birth, ID band Patient awake    Reviewed: Allergy & Precautions, NPO status , Patient's Chart, lab work & pertinent test results, reviewed documented beta blocker date and time   Airway Mallampati: II  TM Distance: >3 FB     Dental  (+) Teeth Intact   Pulmonary former smoker,    breath sounds clear to auscultation       Cardiovascular hypertension, Pt. on medications and Pt. on home beta blockers  Rhythm:Regular Rate:Normal     Neuro/Psych PSYCHIATRIC DISORDERS Anxiety  Neuromuscular disease    GI/Hepatic GERD  Controlled and Medicated,  Endo/Other    Renal/GU      Musculoskeletal  (+) Arthritis ,   Abdominal   Peds  Hematology   Anesthesia Other Findings   Reproductive/Obstetrics                            Anesthesia Physical Anesthesia Plan  ASA: II  Anesthesia Plan: Bier Block   Post-op Pain Management:    Induction:   Airway Management Planned: Simple Face Mask  Additional Equipment:   Intra-op Plan:   Post-operative Plan:   Informed Consent: I have reviewed the patients History and Physical, chart, labs and discussed the procedure including the risks, benefits and alternatives for the proposed anesthesia with the patient or authorized representative who has indicated his/her understanding and acceptance.     Plan Discussed with:   Anesthesia Plan Comments:         Anesthesia Quick Evaluation

## 2016-08-25 NOTE — Brief Op Note (Signed)
08/25/2016  7:54 AM  PATIENT:  Rachel [name]  71 y.o. female  PRE-OPERATIVE DIAGNOSIS:  RIGHT LONG TRIGGER FINGER  POST-OPERATIVE DIAGNOSIS:  RIGHT LONG TRIGGER FINGER  PROCEDURE:  Procedure(s): RELEASE TRIGGER FINGER/A-1 PULLEY RIGHT LONG TRIGGER FINGER RELEASE (Right)  Stenosing tenosynovitis of the right long finger   SURGEON:  Surgeon(s) and Role:    * Carole Civil, MD - Primary  PHYSICIAN ASSISTANT:   ASSISTANTS: none   ANESTHESIA:   regional  EBL:  No intake/output data recorded.  BLOOD ADMINISTERED:none  DRAINS: none   LOCAL MEDICATIONS USED:  MARCAINE     SPECIMEN:  No Specimen  DISPOSITION OF SPECIMEN:  N/A  COUNTS:  YES  TOURNIQUET:  * Missing tourniquet times found for documented tourniquets in log:  [id number] *  DICTATION: .Dragon Dictation  PLAN OF CARE: Discharge to home after PACU  PATIENT DISPOSITION:  PACU - hemodynamically stable.   Delay start of Pharmacological VTE agent (>24hrs) due to surgical blood loss or risk of bleeding: not applicable  [id number]

## 2016-08-25 NOTE — Anesthesia Postprocedure Evaluation (Signed)
Anesthesia Post Note  Patient: Rachel [name]  Procedure(s) Performed: Procedure(s) (LRB): RELEASE TRIGGER FINGER/A-1 PULLEY RIGHT LONG TRIGGER FINGER RELEASE (Right)  Patient location during evaluation: PACU Anesthesia Type: Bier Block Level of consciousness: awake and alert and oriented Pain management: pain level controlled Vital Signs Assessment: post-procedure vital signs reviewed and stable Respiratory status: spontaneous breathing and patient connected to nasal cannula oxygen Cardiovascular status: stable Postop Assessment: no signs of nausea or vomiting Anesthetic complications: no     Last Vitals:  Vitals:   08/25/16 0715 08/25/16 0720  BP: 121/88 117/85  Pulse:    Resp: 13 11  Temp:      Last Pain:  Vitals:   08/25/16 0701  TempSrc: Oral                 ADAMS, AMY A

## 2016-08-25 NOTE — Anesthesia Procedure Notes (Signed)
Procedure Name: MAC Date/Time: 08/25/2016 7:22 AM Performed by: Andree Elk, [place] A Pre-anesthesia Checklist: Patient identified, Timeout performed, Emergency Drugs available, Suction available and Patient being monitored Oxygen Delivery Method: Simple face mask

## 2016-08-25 NOTE — Anesthesia Procedure Notes (Signed)
Anesthesia Regional Block: Bier block (IV Regional)   Pre-Anesthetic Checklist: ,, timeout performed, Correct Patient, Correct Site, Correct Laterality, Correct Procedure,, site marked, surgical consent,, at surgeon's request  Laterality: Right     Needles:  Injection technique: Single-shot  Needle Type: Other      Needle Gauge: 22     Additional Needles:   Procedures:,,,,,,, Esmarch exsanguination, single tourniquet utilized,   Nerve Stimulator or Paresthesia:   Additional Responses:  Pulse checked post tourniquet inflation. IV NSL discontinued post injection. Narrative:  End time: 08/25/2016 7:32 AM  Performed by: Personally

## 2016-08-25 NOTE — Op Note (Signed)
08/25/2016  0758 AM  Operative report  Release right long finger for triggering  Preoperative diagnosis stenosing tenosynovitis of the right long finger Postop diagnosis same Procedure right long finger A1 pulley release  Surgeon Aline Brochure  No assistants  Anesthetic regional Bier block  Operative finding stenosing tenosynovitis of the right long finger at the A1 pulley  Indications for procedure persistent triggering locking and pain despite nonoperative treatment including rest and injection  The patient was identified as Rachel [name] in the preop area the surgical site right long finger was confirmed and marked chart review was completed  Patient was taken to surgery where she had a Bier block without complication  After timeout was completed a longitudinal incision was made at the distal transverse palmar crease over the right long finger. Subtenons tissue was divided bluntly. Careful attention was put a plate to the neurovascular structures and they were protected  Retractors were placed  A blunt probe was placed beneath the A1 pulley and it was released under direct visualization  The finger was flexed and extended there was no catching or locking  The bone was irrigated and closed with 3-0 nylon interrupted sutures  0.5% Marcaine plain was injected 10 mL  Sterile dressing was applied  Tourniquet was released  The hand was elevated the fingers have good color and capillary refill  The patient was taken to the recovery room in stable condition  CPT code is [phone]

## 2016-08-25 NOTE — Transfer of Care (Signed)
Immediate Anesthesia Transfer of Care Note  Patient: Rachel [name]  Procedure(s) Performed: Procedure(s): RELEASE TRIGGER FINGER/A-1 PULLEY RIGHT LONG TRIGGER FINGER RELEASE (Right)  Patient Location: PACU  Anesthesia Type:MAC and Bier block  Level of Consciousness: awake, alert , oriented and patient cooperative  Airway & Oxygen Therapy: Patient Spontanous Breathing and Patient connected to nasal cannula oxygen  Post-op Assessment: Report given to RN and Post -op Vital signs reviewed and stable  Post vital signs: Reviewed and stable  Last Vitals:  Vitals:   08/25/16 0715 08/25/16 0720  BP: 121/88 117/85  Pulse:    Resp: 13 11  Temp:      Last Pain:  Vitals:   08/25/16 0701  TempSrc: Oral      Patients Stated Pain Goal: 6 (16/38/46 6599)  Complications: No apparent anesthesia complications

## 2016-08-27 ENCOUNTER — Encounter (HOSPITAL_COMMUNITY): Payer: Self-pay | Admitting: Orthopedic Surgery

## 2016-08-30 ENCOUNTER — Encounter: Payer: Self-pay | Admitting: Orthopedic Surgery

## 2016-08-30 ENCOUNTER — Ambulatory Visit (INDEPENDENT_AMBULATORY_CARE_PROVIDER_SITE_OTHER): Payer: PPO | Admitting: Orthopedic Surgery

## 2016-08-30 DIAGNOSIS — Z4889 Encounter for other specified surgical aftercare: Secondary | ICD-10-CM

## 2016-08-30 NOTE — Progress Notes (Signed)
Chief Complaint  Patient presents with  . Follow-up    RT LONG TFR, DOS 08/25/16   PRE-OPERATIVE DIAGNOSIS:  RIGHT LONG TRIGGER FINGER   POST-OPERATIVE DIAGNOSIS:  RIGHT LONG TRIGGER FINGER   PROCEDURE:  Procedure(s): RELEASE TRIGGER FINGER/A-1 PULLEY RIGHT LONG TRIGGER FINGER RELEASE (Right)   Stenosing tenosynovitis of the right long finger  Fullness clean she has a little stiffness we've encouraged active range of motion return for suture removal on the  13th

## 2016-09-05 ENCOUNTER — Other Ambulatory Visit: Payer: Self-pay | Admitting: Family Medicine

## 2016-09-09 ENCOUNTER — Encounter: Payer: Self-pay | Admitting: Orthopedic Surgery

## 2016-09-09 ENCOUNTER — Ambulatory Visit (INDEPENDENT_AMBULATORY_CARE_PROVIDER_SITE_OTHER): Payer: PPO | Admitting: Orthopedic Surgery

## 2016-09-09 DIAGNOSIS — Z4889 Encounter for other specified surgical aftercare: Secondary | ICD-10-CM

## 2016-09-09 DIAGNOSIS — M6530 Trigger finger, unspecified finger: Secondary | ICD-10-CM

## 2016-09-09 NOTE — Progress Notes (Signed)
Patient ID: Rachel [name], female   DOB: [birth year], 71 y.o.   MRN: [id number]  POSTOP VISIT   Chief Complaint  Patient presents with  . Follow-up    RT LONG TFR, DOS 08/25/16    OP REPORT  PRE-OPERATIVE DIAGNOSIS:  RIGHT LONG TRIGGER FINGER   POST-OPERATIVE DIAGNOSIS:  RIGHT LONG TRIGGER FINGER   PROCEDURE:  Procedure(s): RELEASE TRIGGER FINGER/A-1 PULLEY RIGHT LONG TRIGGER FINGER RELEASE (Right)   Stenosing tenosynovitis of the right long finger   Encounter Diagnoses  Name Primary?  Marland Kitchen Aftercare following surgery Yes  . Trigger finger, acquired     Sutures were taken out and the patient's finger looks good she has full flexion  12:02 PM Arther Abbott, MD 09/09/2016

## 2017-01-23 ENCOUNTER — Other Ambulatory Visit: Payer: Self-pay | Admitting: Family Medicine

## 2017-01-23 DIAGNOSIS — I10 Essential (primary) hypertension: Secondary | ICD-10-CM

## 2017-01-23 DIAGNOSIS — R251 Tremor, unspecified: Secondary | ICD-10-CM

## 2017-01-23 DIAGNOSIS — K5900 Constipation, unspecified: Secondary | ICD-10-CM

## 2017-01-23 DIAGNOSIS — K219 Gastro-esophageal reflux disease without esophagitis: Secondary | ICD-10-CM

## 2017-01-23 DIAGNOSIS — Z23 Encounter for immunization: Secondary | ICD-10-CM

## 2017-05-06 ENCOUNTER — Other Ambulatory Visit: Payer: Self-pay | Admitting: Family Medicine

## 2017-06-28 ENCOUNTER — Telehealth: Payer: Self-pay | Admitting: *Deleted

## 2017-06-28 NOTE — Telephone Encounter (Signed)
Call placed to patient and patient made aware.  

## 2017-06-28 NOTE — Telephone Encounter (Signed)
-----   Message from [name], MD sent at 06/23/2017  7:37 AM EST ----- One 1 pneumonia shot of both Prevnar and pneumovax 23 after age 72 ----- Message ----- From: Six, Eden Lathe, LPN Sent: 5/36/6440   8:00 AM To: [name], MD  MD please advise.   Pneumovax 23 02/17/2012  Prevnar 13 06/14/2013 ----- Message ----- From: Alyson Locket, RMA Sent: 06/21/2017   3:32 PM To: Eden Lathe Six, LPN  Called and wanted to know if she was due for her PNA shot - I was not sure if Dr. Keturah Barre did them q 5 years after they are over 13 or does she stick to the once after 65 rule?????  Please call her and let her know - [phone]

## 2017-07-18 ENCOUNTER — Other Ambulatory Visit: Payer: Self-pay | Admitting: Family Medicine

## 2017-07-18 DIAGNOSIS — K219 Gastro-esophageal reflux disease without esophagitis: Secondary | ICD-10-CM

## 2017-07-18 DIAGNOSIS — I10 Essential (primary) hypertension: Secondary | ICD-10-CM

## 2017-07-18 DIAGNOSIS — R251 Tremor, unspecified: Secondary | ICD-10-CM

## 2017-07-18 DIAGNOSIS — Z23 Encounter for immunization: Secondary | ICD-10-CM

## 2017-07-18 DIAGNOSIS — K5900 Constipation, unspecified: Secondary | ICD-10-CM

## 2017-08-02 ENCOUNTER — Other Ambulatory Visit: Payer: Self-pay | Admitting: *Deleted

## 2017-08-02 DIAGNOSIS — K5900 Constipation, unspecified: Secondary | ICD-10-CM

## 2017-08-02 DIAGNOSIS — R251 Tremor, unspecified: Secondary | ICD-10-CM

## 2017-08-02 DIAGNOSIS — Z23 Encounter for immunization: Secondary | ICD-10-CM

## 2017-08-02 DIAGNOSIS — K219 Gastro-esophageal reflux disease without esophagitis: Secondary | ICD-10-CM

## 2017-08-02 DIAGNOSIS — I10 Essential (primary) hypertension: Secondary | ICD-10-CM

## 2017-08-02 MED ORDER — PROPRANOLOL HCL 20 MG PO TABS: 20.0000 mg | ORAL_TABLET | Freq: Two times a day (BID) | ORAL | 0 refills | Status: DC

## 2017-08-02 MED ORDER — PANTOPRAZOLE SODIUM 40 MG PO TBEC: 40.0000 mg | DELAYED_RELEASE_TABLET | Freq: Every day | ORAL | 0 refills | Status: DC

## 2017-08-02 MED ORDER — ALENDRONATE SODIUM 70 MG PO TABS: 70.0000 mg | ORAL_TABLET | ORAL | 0 refills | Status: DC

## 2017-09-15 ENCOUNTER — Encounter: Payer: PPO | Admitting: Family Medicine

## 2017-09-29 ENCOUNTER — Ambulatory Visit: Payer: Self-pay | Admitting: Family Medicine

## 2017-10-03 ENCOUNTER — Other Ambulatory Visit: Payer: Self-pay

## 2017-10-03 ENCOUNTER — Encounter: Payer: Self-pay | Admitting: Family Medicine

## 2017-10-03 ENCOUNTER — Ambulatory Visit (INDEPENDENT_AMBULATORY_CARE_PROVIDER_SITE_OTHER): Payer: PPO | Admitting: Family Medicine

## 2017-10-03 VITALS — BP 132/68 | HR 82 | Temp 98.0°F | Resp 14 | Ht 65.0 in | Wt 150.0 lb

## 2017-10-03 DIAGNOSIS — Z1211 Encounter for screening for malignant neoplasm of colon: Secondary | ICD-10-CM | POA: Diagnosis not present

## 2017-10-03 DIAGNOSIS — K5900 Constipation, unspecified: Secondary | ICD-10-CM

## 2017-10-03 DIAGNOSIS — Z1231 Encounter for screening mammogram for malignant neoplasm of breast: Secondary | ICD-10-CM | POA: Diagnosis not present

## 2017-10-03 DIAGNOSIS — I10 Essential (primary) hypertension: Secondary | ICD-10-CM

## 2017-10-03 DIAGNOSIS — E559 Vitamin D deficiency, unspecified: Secondary | ICD-10-CM | POA: Diagnosis not present

## 2017-10-03 DIAGNOSIS — K219 Gastro-esophageal reflux disease without esophagitis: Secondary | ICD-10-CM

## 2017-10-03 DIAGNOSIS — Z1239 Encounter for other screening for malignant neoplasm of breast: Secondary | ICD-10-CM

## 2017-10-03 MED ORDER — LINACLOTIDE 72 MCG PO CAPS: 72.0000 ug | ORAL_CAPSULE | Freq: Every day | ORAL | 0 refills | Status: DC

## 2017-10-03 NOTE — Progress Notes (Signed)
   Subjective:    Patient ID: Rachel [name], female    DOB: 21-Nov-1945, 72 y.o.   MRN: [id number]  Patient presents for Constipation (is taking 1/2 dose of Miralax and it iss ok, but b=needs something more effective without cramps)   Constipation- using miralax to help with bowels, feels like she is not emptying all the way, gets cramps that can last for a few hours Somes days have looser stools, Has not noticed blood in the stool    Has colonosopy on 2010 in [place]  No weight loss   Due for GYN f/u  Due for mammogram    Osteoporosis taking fosamax, taking calcium and vitamin D  GERD- protonix works well for her GERD,rarely gets breakthrough   HTN- takin inderal as prescribed   Difficult getting to office due to transportation  Review Of Systems:  GEN- denies fatigue, fever, weight loss,weakness, recent illness HEENT- denies eye drainage, change in vision, nasal discharge, CVS- denies chest pain, palpitations RESP- denies SOB, cough, wheeze ABD- denies N/V, change in stools, abd pain GU- denies dysuria, hematuria, dribbling, incontinence MSK- denies joint pain, muscle aches, injury Neuro- denies headache, dizziness, syncope, seizure activity       Objective:    BP 132/68   Pulse 82   Temp 98 F (36.7 C) (Temporal)   Resp 14   Ht 5\' 5"  (1.651 m)   Wt 150 lb (68 kg)   SpO2 98%   BMI 24.96 kg/m  GEN- NAD, alert and oriented x3 HEENT- PERRL, EOMI, non injected sclera, pink conjunctiva, MMM, oropharynx clear Neck- Supple, no thyromegaly CVS- RRR, no murmur RESP-CTAB ABD-NABS,soft,NT,ND EXT- No edema Pulses- Radial, DP- 2+        Assessment & Plan:      Problem List Items Addressed This Visit      Unprioritized   Vitamin D deficiency   Relevant Orders   Vitamin D, 25-hydroxy (Completed)   GERD (gastroesophageal reflux disease) (Chronic)    Continue PPI      Relevant Medications   linaclotide (LINZESS) 72 MCG capsule   Essential  hypertension, benign - Primary (Chronic)    Controlled no changes      Relevant Orders   CBC with Differential/Platelet (Completed)   Comprehensive metabolic panel (Completed)   Lipid panel (Completed)   Constipation    Given samples of Linzess, referral to GI       Other Visit Diagnoses    Breast cancer screening       pt to schedule      Note: This dictation was prepared with Dragon dictation along with smaller phrase technology. Any transcriptional errors that result from this process are unintentional.

## 2017-10-03 NOTE — Patient Instructions (Addendum)
Vitamin D you can take up to 2000IU once a day  Okay to take vitamin C  Schedule your mammogram  Try Linzess  F/U 6 months

## 2017-10-04 LAB — CBC WITH DIFFERENTIAL/PLATELET
BASOS PCT: 1.4 %
Basophils Absolute: 62 cells/uL (ref 0–200)
EOS ABS: 119 {cells}/uL (ref 15–500)
Eosinophils Relative: 2.7 %
HCT: 41.4 % (ref 35.0–45.0)
HEMOGLOBIN: 14 g/dL (ref 11.7–15.5)
Lymphs Abs: 1351 cells/uL (ref 850–3900)
MCH: 27.9 pg (ref 27.0–33.0)
MCHC: 33.8 g/dL (ref 32.0–36.0)
MCV: 82.5 fL (ref 80.0–100.0)
MONOS PCT: 17.2 %
MPV: 12.2 fL (ref 7.5–12.5)
NEUTROS ABS: 2112 {cells}/uL (ref 1500–7800)
Neutrophils Relative %: 48 %
Platelets: 237 10*3/uL (ref 140–400)
RBC: 5.02 10*6/uL (ref 3.80–5.10)
RDW: 13.2 % (ref 11.0–15.0)
Total Lymphocyte: 30.7 %
WBC mixed population: 757 cells/uL (ref 200–950)
WBC: 4.4 10*3/uL (ref 3.8–10.8)

## 2017-10-04 LAB — COMPREHENSIVE METABOLIC PANEL
AG RATIO: 1.2 (calc) (ref 1.0–2.5)
ALBUMIN MSPROF: 4.2 g/dL (ref 3.6–5.1)
ALT: 17 U/L (ref 6–29)
AST: 22 U/L (ref 10–35)
Alkaline phosphatase (APISO): 60 U/L (ref 33–130)
BUN / CREAT RATIO: 16 (calc) (ref 6–22)
BUN: 16 mg/dL (ref 7–25)
CO2: 27 mmol/L (ref 20–32)
Calcium: 10.7 mg/dL — ABNORMAL HIGH (ref 8.6–10.4)
Chloride: 103 mmol/L (ref 98–110)
Creat: 0.97 mg/dL — ABNORMAL HIGH (ref 0.60–0.93)
GLOBULIN: 3.5 g/dL (ref 1.9–3.7)
GLUCOSE: 98 mg/dL (ref 65–99)
POTASSIUM: 4.1 mmol/L (ref 3.5–5.3)
SODIUM: 139 mmol/L (ref 135–146)
TOTAL PROTEIN: 7.7 g/dL (ref 6.1–8.1)
Total Bilirubin: 0.8 mg/dL (ref 0.2–1.2)

## 2017-10-04 LAB — LIPID PANEL
CHOL/HDL RATIO: 2.3 (calc) (ref <5.0)
CHOLESTEROL: 215 mg/dL — AB (ref <200)
HDL: 93 mg/dL (ref >50)
LDL CHOLESTEROL (CALC): 102 mg/dL — AB
Non-HDL Cholesterol (Calc): 122 mg/dL (calc) (ref <130)
TRIGLYCERIDES: 103 mg/dL (ref <150)

## 2017-10-04 LAB — VITAMIN D 25 HYDROXY (VIT D DEFICIENCY, FRACTURES): Vit D, 25-Hydroxy: 64 ng/mL (ref 30–100)

## 2017-10-04 NOTE — Assessment & Plan Note (Signed)
Continue PPI ?

## 2017-10-04 NOTE — Assessment & Plan Note (Signed)
Given samples of Linzess, referral to GI

## 2017-10-04 NOTE — Assessment & Plan Note (Signed)
Controlled no changes 

## 2017-10-05 ENCOUNTER — Encounter: Payer: Self-pay | Admitting: *Deleted

## 2017-10-17 ENCOUNTER — Ambulatory Visit (INDEPENDENT_AMBULATORY_CARE_PROVIDER_SITE_OTHER): Payer: PPO | Admitting: Internal Medicine

## 2017-10-18 ENCOUNTER — Ambulatory Visit (INDEPENDENT_AMBULATORY_CARE_PROVIDER_SITE_OTHER): Payer: PPO | Admitting: Internal Medicine

## 2017-10-30 ENCOUNTER — Ambulatory Visit (INDEPENDENT_AMBULATORY_CARE_PROVIDER_SITE_OTHER): Payer: PPO | Admitting: Internal Medicine

## 2017-10-30 ENCOUNTER — Encounter (INDEPENDENT_AMBULATORY_CARE_PROVIDER_SITE_OTHER): Payer: Self-pay | Admitting: Internal Medicine

## 2017-10-30 VITALS — BP 138/90 | HR 68 | Temp 98.3°F | Ht 64.5 in | Wt 152.2 lb

## 2017-10-30 DIAGNOSIS — K5909 Other constipation: Secondary | ICD-10-CM

## 2017-10-30 NOTE — Progress Notes (Signed)
Subjective:    Patient ID: Rachel [name], female    DOB: [birth year], 72 y.o.   MRN: [id number]  HPI Referred by Dr. Buelah Manis for constipation. She tells me she has trouble moving her BMs. If she doesn't take the MIralax, she will become constipated. She also take Linzess.  She says has a BM daily if she take the Miralax. If she doesn't take the Miralax, she will not have a BM. She does not take the Linzess every day because she will have diarrhea. Some of her stools are soft. She may have several BMs a day. Symptoms for years.  GERD controlled with Protonix. Appetite is good. No abdominala pain.   Per records, last colonoscopy was in [facility] and she reports it was good.  Had EGD in 2014 (epigastric pain):  Impression: Small sliding heart hernia with mild changes of reflux esophagitis limited to GE junction. Nonerosive antral gastritis along with pyloric channel inflammation and small scar. Biopsy taken from prepyloric mucosa for routine histology. Suspect these changes may be secondary to NSAID therapy.  Review of Systems Past Medical History:  Diagnosis Date  . Back pain   . Carpal tunnel syndrome   . Hypertension   . Low grade squamous intraepith lesion on cytologic smear cervix (lgsil)    +hpv, HAD HYSTERECTOMY  . OA (osteoarthritis)     Past Surgical History:  Procedure Laterality Date  . ABDOMINAL HYSTERECTOMY  2006  . BREAST CYST EXCISION     right  . BUNIONECTOMY     left  . CARPAL TUNNEL RELEASE Right   . ESOPHAGOGASTRODUODENOSCOPY  06/13/2012   Procedure: ESOPHAGOGASTRODUODENOSCOPY (EGD);  Surgeon: Rogene Houston, MD;  Location: [facility];  Service: Endoscopy;  Laterality: N/A;  200  . TRIGGER FINGER RELEASE Right 08/25/2016   Procedure: RELEASE TRIGGER FINGER/A-1 PULLEY RIGHT LONG TRIGGER FINGER RELEASE;  Surgeon: Carole Civil, MD;  Location: [facility];  Service: Orthopedics;  Laterality: Right;    No Known Allergies  Current Outpatient Medications on File  Prior to Visit  Medication Sig Dispense Refill  . alendronate (FOSAMAX) 70 MG tablet Take 1 tablet (70 mg total) by mouth once a week. Take with a full glass of water on an empty stomach on Sundays 12 tablet 0  . linaclotide (LINZESS) 72 MCG capsule Take 1 capsule (72 mcg total) by mouth daily before breakfast. 30 capsule 0  . Multiple Vitamin (MULTIVITAMIN WITH MINERALS) TABS tablet Take 1 tablet by mouth daily.    . naproxen sodium (ANAPROX) 220 MG tablet Take 220-440 mg by mouth 2 (two) times daily as needed (pain).     Marland Kitchen OVER THE COUNTER MEDICATION Take 1 tablet by mouth daily as needed (energy). Management consultant     . pantoprazole (PROTONIX) 40 MG tablet Take 1 tablet (40 mg total) by mouth daily. 90 tablet 0  . polyethylene glycol powder (GLYCOLAX/MIRALAX) powder Mix 1 capful (17 gm) in 8 oz of liquid and drink twice a day as needed 3162 g 0  . propranolol (INDERAL) 20 MG tablet Take 1 tablet (20 mg total) by mouth 2 (two) times daily. 180 tablet 0   No current facility-administered medications on file prior to visit.         Objective:   Physical Exam Blood pressure 138/90, pulse 68, temperature 98.3 F (36.8 C), height 5' 4.5" (1.638 m), weight 152 lb 3.2 oz (69 kg). Alert and oriented. Skin warm and dry. Oral mucosa is moist.   .  Sclera anicteric, conjunctivae is pink. Thyroid not enlarged. No cervical lymphadenopathy. Lungs clear. Heart regular rate and rhythm.  Abdomen is soft. Bowel sounds are positive. No hepatomegaly. No abdominal masses felt. No tenderness.  No edema to lower extremities.  .         Assessment & Plan:  Constipation: Stop the Miralax. Continue the LInzess.  OV in 4 weeks.  ? May be due for colonoscopy

## 2017-10-30 NOTE — Patient Instructions (Addendum)
Stop the MIralax for now.  Continue the Linzess.

## 2017-10-31 ENCOUNTER — Encounter: Payer: PPO | Admitting: Family Medicine

## 2017-11-01 ENCOUNTER — Ambulatory Visit (INDEPENDENT_AMBULATORY_CARE_PROVIDER_SITE_OTHER): Payer: PPO | Admitting: Internal Medicine

## 2017-11-06 ENCOUNTER — Other Ambulatory Visit: Payer: Self-pay | Admitting: *Deleted

## 2017-11-06 MED ORDER — LINACLOTIDE 72 MCG PO CAPS: 72.0000 ug | ORAL_CAPSULE | Freq: Every day | ORAL | 1 refills | Status: DC

## 2017-11-08 ENCOUNTER — Ambulatory Visit (INDEPENDENT_AMBULATORY_CARE_PROVIDER_SITE_OTHER): Payer: PPO | Admitting: Physician Assistant

## 2017-11-08 ENCOUNTER — Encounter: Payer: Self-pay | Admitting: Physician Assistant

## 2017-11-08 ENCOUNTER — Other Ambulatory Visit: Payer: Self-pay

## 2017-11-08 VITALS — BP 134/86 | HR 67 | Temp 98.2°F | Resp 14 | Ht 65.0 in | Wt 153.2 lb

## 2017-11-08 DIAGNOSIS — Z0000 Encounter for general adult medical examination without abnormal findings: Secondary | ICD-10-CM | POA: Diagnosis not present

## 2017-11-08 NOTE — Progress Notes (Signed)
Patient ID: Rachel [name] MRN: [id number], DOB: [birth year], 72 y.o. Date of Encounter: 11/08/2017,   Chief Complaint: Physical (CPE)/Medicare annual physical  HPI: 72 y.o. y/o female  here for CPE / Medicare annual physical  She usually sees Dr. Buelah Manis.  However the schedulers her made a note that [name] did not want to wait to do her for Medicare physical and Dr. Dorian Heckle schedule for physicals is booked out.  For she is on my schedule today for her Medicare physical.      Review of Systems: Consitutional: No fever, chills, fatigue, night sweats, lymphadenopathy. No significant/unexplained weight changes. Eyes: No visual changes, eye redness, or discharge. ENT/Mouth: No ear pain, sore throat, nasal drainage, or sinus pain. Cardiovascular: No chest pressure,heaviness, tightness or squeezing, even with exertion. No increased shortness of breath or dyspnea on exertion.No palpitations, edema, orthopnea, PND. Respiratory: No cough, hemoptysis, SOB, or wheezing. Gastrointestinal: No anorexia, dysphagia, reflux, pain, nausea, vomiting, hematemesis, diarrhea, constipation, BRBPR, or melena. Breast: No mass, nodules, bulging, or retraction. No skin changes or inflammation. No nipple discharge. No lymphadenopathy. Genitourinary: No dysuria, hematuria, incontinence, vaginal discharge, pruritis, burning, abnormal bleeding, or pain. Musculoskeletal: No decreased ROM, No joint pain or swelling. No significant pain in neck, back, or extremities. Skin: No rash, pruritis, or concerning lesions. Neurological: No headache, dizziness, syncope, seizures, tremors, memory loss, coordination problems, or paresthesias. Psychological: No anxiety, depression, hallucinations, SI/HI. Endocrine: No polydipsia, polyphagia, polyuria, or known diabetes.No increased fatigue. No palpitations/rapid heart rate. No significant/unexplained weight change. All other systems were reviewed and are otherwise  negative.  Past Medical History:  Diagnosis Date  . Back pain   . Carpal tunnel syndrome   . Hypertension   . Low grade squamous intraepith lesion on cytologic smear cervix (lgsil)    +hpv, HAD HYSTERECTOMY  . OA (osteoarthritis)      Past Surgical History:  Procedure Laterality Date  . ABDOMINAL HYSTERECTOMY  2006  . BREAST CYST EXCISION     right  . BUNIONECTOMY     left  . CARPAL TUNNEL RELEASE Right   . ESOPHAGOGASTRODUODENOSCOPY  06/13/2012   Procedure: ESOPHAGOGASTRODUODENOSCOPY (EGD);  Surgeon: Rogene Houston, MD;  [facility];  Service: Endoscopy;  Laterality: N/A;  200  . TRIGGER FINGER RELEASE Right 08/25/2016   Procedure: RELEASE TRIGGER FINGER/A-1 PULLEY RIGHT LONG TRIGGER FINGER RELEASE;  Surgeon: Carole Civil, MD;  Location: [facility];  Service: Orthopedics;  Laterality: Right;    Home Meds:  Outpatient Medications Prior to Visit  Medication Sig Dispense Refill  . alendronate (FOSAMAX) 70 MG tablet Take 1 tablet (70 mg total) by mouth once a week. Take with a full glass of water on an empty stomach on Sundays 12 tablet 0  . linaclotide (LINZESS) 72 MCG capsule Take 1 capsule (72 mcg total) by mouth daily before breakfast. 90 capsule 1  . Multiple Vitamin (MULTIVITAMIN WITH MINERALS) TABS tablet Take 1 tablet by mouth daily.    . naproxen sodium (ANAPROX) 220 MG tablet Take 220-440 mg by mouth 2 (two) times daily as needed (pain).     Marland Kitchen OVER THE COUNTER MEDICATION Take 1 tablet by mouth daily as needed (energy). Management consultant     . pantoprazole (PROTONIX) 40 MG tablet Take 1 tablet (40 mg total) by mouth daily. 90 tablet 0  . polyethylene glycol powder (GLYCOLAX/MIRALAX) powder Mix 1 capful (17 gm) in 8 oz of liquid and drink twice a day as needed 3162  g 0  . propranolol (INDERAL) 20 MG tablet Take 1 tablet (20 mg total) by mouth 2 (two) times daily. 180 tablet 0   No facility-administered medications prior to visit.     Allergies: No  Known Allergies  Social History   Socioeconomic History  . Marital status: Single    Spouse name: Not on file  . Number of children: Not on file  . Years of education: Not on file  . Highest education level: Not on file  Occupational History  . Not on file  Social Needs  . Financial resource strain: Not on file  . Food insecurity:    Worry: Not on file    Inability: Not on file  . Transportation needs:    Medical: Not on file    Non-medical: Not on file  Tobacco Use  . Smoking status: Former Smoker    Packs/day: 0.25    Years: 50.00    Pack years: 12.50    Types: Cigarettes    Last attempt to quit: 07/28/2013    Years since quitting: 4.2  . Smokeless tobacco: Never Used  Substance and Sexual Activity  . Alcohol use: Yes    Alcohol/week: 0.0 oz    Comment: occ  . Drug use: No  . Sexual activity: Not Currently    Birth control/protection: Surgical    Comment: hyst  Lifestyle  . Physical activity:    Days per week: Not on file    Minutes per session: Not on file  . Stress: Not on file  Relationships  . Social connections:    Talks on phone: Not on file    Gets together: Not on file    Attends religious service: Not on file    Active member of club or organization: Not on file    Attends meetings of clubs or organizations: Not on file    Relationship status: Not on file  . Intimate partner violence:    Fear of current or ex partner: Not on file    Emotionally abused: Not on file    Physically abused: Not on file    Forced sexual activity: Not on file  Other Topics Concern  . Not on file  Social History Narrative  . Not on file    Family History  Problem Relation Age of Onset  . Diabetes Father   . Cancer Father        prostate cancer  . Cancer Sister        breast  . Hypertension Son   . Cancer Maternal Grandmother   . Eczema Daughter   . Other Daughter        stomach issues    Physical Exam: Blood pressure 134/86, pulse 67, temperature 98.2 F  (36.8 C), temperature source Oral, resp. rate 14, height 5\' 5"  (1.651 m), weight 69.5 kg (153 lb 3.2 oz), SpO2 99 %., Body mass index is 25.49 kg/m. General: Well developed, well nourished AAF. Appears in no acute distress. HEENT: Normocephalic, atraumatic. Conjunctiva pink, sclera non-icteric. Pupils 2 mm constricting to 1 mm, round, regular, and equally reactive to light and accomodation. EOMI. Internal auditory canal clear. TMs with good cone of light and without pathology. Nasal mucosa pink. Nares are without discharge. No sinus tenderness. Oral mucosa pink. Neck: Supple. Trachea midline. No thyromegaly. Full ROM. No lymphadenopathy.No Carotid Bruits. Lungs: Clear to auscultation bilaterally without wheezes, rales, or rhonchi. Breathing is of normal effort and unlabored. Cardiovascular: RRR with S1 S2. No murmurs, rubs, or  gallops.  Breast: Per Gyn Abdomen: Soft, non-tender, non-distended with normoactive bowel sounds. No hepatosplenomegaly or masses. No rebound/guarding. No CVA tenderness. No hernias.  Genitourinary: Per Gyn Musculoskeletal: Full range of motion and 5/5 strength throughout.  Skin: Warm and moist without erythema, ecchymosis, wounds, or rash. Neuro: A+Ox3. CN II-XII grossly intact. Moves all extremities spontaneously. Full sensation throughout. Normal gait.  Psych:  Responds to questions appropriately with a normal affect.   Assessment/Plan:  73 y.o. y/o female here for CPE   1. Medicare annual wellness visit, subsequent  2. Encounter for preventive health examination  A. Screening Labs: She just had labs on 10/03/2017 that checked vitamin D, lipid, CME T, CBC.  These were all stable and in good at that time so do not need to repeat these today.  B. Pap: She states that she sees Dr. Glo Herring for GYN exam.  She will schedule annual follow-up exam with him.  C. Screening Mammogram: She states that she sees Dr. Glo Herring for GYN exam.  She will schedule annual follow-up  exam with him. Also reviewed that she usually has her mammograms [facility].  Last was in January 2018.  Negative.  Reviewed this with her today and reviewed the fact that she is due for mammogram and she is to call [facility] and schedule follow-up.  D. DEXA/BMD:  She had DEXA scan 06/22/2016.  T score was -2.5.  She is currently on Fosamax calcium and vitamin D. Today she reports that at prior visit Dr. Richardson Chiquito had discussed "getting a shot every 6 months instead of a pill every week.  "States that she has decided that she "wants the shot ".  Says that she sometimes forgets the pill.  Says that Sunday is supposed to be the day that she takes her pill for Fosamax.  However sometimes on Monday or Tuesday will realize that she did not take it and so we then will take it on that day instead of on Sunday. Today I discussed getting a pillbox we are once a week you put out your pills for the week and discussed that is an option but she still wants to look into doing the shot every 6 months so she will not forget to take her weekly pill.  I will have the staff follow-up on this issue.  E. Colorectal Cancer Screening: She states that she had a colonoscopy in 2010.  Thinks that this is not due to repeat until 2020.  F. Immunizations:  Influenza: N/A Tetanus: Last TD was in 2008.  However this is not covered by Medicare so will not give this. Pneumococcal: She has had both Prevnar and the Pneumovax.  No further pneumonia vaccine needed. Zostavax/Shingrix: When she had physical with Dr. Buelah Manis in 05/2016 Dr. Buelah Manis discussed Shingrix and for her to get that at the pharmacy.  However when I asked patient about this today she says she has not had shingles vaccine.  I have discussed with her to check with her insurance regarding coverage and cost and if she wants to proceed with getting this vaccine then receive this at the pharmacy.  Subjective:   Patient presents for Medicare Annual/Subsequent preventive  examination.   Review Past Medical/Family/Social: All of this information is reviewed today.  Risk Factors  Current exercise habits: No formal exercise. Dietary issues discussed: Have discussed low sodium low carbohydrate low cholesterol diet.  Cardiac risk factors: Age  Depression Screen  (Note: if answer to either of the following is "Yes",  a more complete depression screening is indicated)  Over the past two weeks, have you felt down, depressed or hopeless? No Over the past two weeks, have you felt little interest or pleasure in doing things? No Have you lost interest or pleasure in daily life? No Do you often feel hopeless? No Do you cry easily over simple problems? No   Activities of Daily Living  In your present state of health, do you have any difficulty performing the following activities?:  Driving? No  Managing money? No  Feeding yourself? No  Getting from bed to chair? No  Climbing a flight of stairs? No  Preparing food and eating?: No  Bathing or showering? No  Getting dressed: No  Getting to the toilet? No  Using the toilet:No  Moving around from place to place: No  In the past year have you fallen or had a near fall?:No  Are you sexually active? No  Do you have more than one partner? No   Hearing Difficulties: No  Do you often ask people to speak up or repeat themselves? No  Do you experience ringing or noises in your ears? No Do you have difficulty understanding soft or whispered voices? No  Do you feel that you have a problem with memory? No Do you often misplace items? No  Do you feel safe at home? Yes  Cognitive Testing  Alert? Yes Normal Appearance?Yes  Oriented to person? Yes Place? Yes  Time? Yes  Recall of three objects? Yes  Can perform simple calculations? Yes  Displays appropriate judgment?Yes  Can read the correct time from a watch face?Yes   List the Names of Other Physician/Practitioners you currently use:  Dr. Glo Herring for  GYN  Indicate any recent Medical Services you may have received from other than [facility] providers in the past year (date may be approximate).  Dr. Glo Herring for GYN  Screening Tests / Date---------- all of this information as documented above.  See above. Colonoscopy                     Zostavax  Mammogram  Influenza Vaccine  Tetanus/tdap    Assessment:    Annual wellness medicare exam   Plan:    During the course of the visit the patient was educated and counseled about appropriate screening and preventive services including:  Screening mammography  Colorectal cancer screening  Shingles vaccine. Prescription given to that she can get the vaccine at the pharmacy or Medicare part D.   Medicare Attestation  I have personally reviewed:  The patient's medical and social history  Their use of alcohol, tobacco or illicit drugs  Their current medications and supplements  The patient's functional ability including ADLs,fall risks, home safety risks, cognitive, and hearing and visual impairment  Diet and physical activities    The patient's weight, height, BMI have been recorded in the chart. I have made referrals, counseling, and provided education to the patient based on review of the above and I have provided the patient with a written personalized care plan for preventive services.      [address] [place], [place], [facility] 11/08/2017 3:47 PM

## 2017-11-17 ENCOUNTER — Encounter: Payer: Self-pay | Admitting: Family Medicine

## 2017-11-21 ENCOUNTER — Other Ambulatory Visit: Payer: Self-pay | Admitting: Family Medicine

## 2017-11-21 DIAGNOSIS — K5900 Constipation, unspecified: Secondary | ICD-10-CM

## 2017-11-21 DIAGNOSIS — K219 Gastro-esophageal reflux disease without esophagitis: Secondary | ICD-10-CM

## 2017-11-21 DIAGNOSIS — R251 Tremor, unspecified: Secondary | ICD-10-CM

## 2017-11-21 DIAGNOSIS — Z23 Encounter for immunization: Secondary | ICD-10-CM

## 2017-11-21 DIAGNOSIS — I10 Essential (primary) hypertension: Secondary | ICD-10-CM

## 2017-11-27 ENCOUNTER — Ambulatory Visit (INDEPENDENT_AMBULATORY_CARE_PROVIDER_SITE_OTHER): Payer: PPO | Admitting: Internal Medicine

## 2017-11-27 ENCOUNTER — Encounter (INDEPENDENT_AMBULATORY_CARE_PROVIDER_SITE_OTHER): Payer: Self-pay | Admitting: Internal Medicine

## 2017-11-27 VITALS — BP 150/80 | HR 72 | Temp 98.4°F | Ht 64.5 in | Wt 149.9 lb

## 2017-11-27 DIAGNOSIS — K5900 Constipation, unspecified: Secondary | ICD-10-CM | POA: Diagnosis not present

## 2017-11-27 NOTE — Patient Instructions (Signed)
Samples  Of Linzess given to patient.

## 2017-11-27 NOTE — Progress Notes (Signed)
Subjective:    Patient ID: Rachel [name], female    DOB: 06/22/1945, 72 y.o.   MRN: [id number]  HPI Here today for f/u. Last seen in June of this year for constipation . She says she thinks she needs a higher dose on the Linzess.  She has hx of constipation for year. She is having a BM daily but they are a little at a time. She may go about  3 times and stools are small and loose. Her appetite has remained good. No weight loss.  Per records, last colonoscopy was in [facility] and she reports it was good.  Had EGD in 2014 (epigastric pain):  Impression: Small sliding heart hernia with mild changes of reflux esophagitis limited to GE junction. Nonerosive antral gastritis along with pyloric channel inflammation and small scar. Biopsy taken from prepyloric mucosa for routine histology. Suspect these changes may be secondary to NSAID therapy.  Review of Systems Past Medical History:  Diagnosis Date  . Back pain   . Carpal tunnel syndrome   . Hypertension   . Low grade squamous intraepith lesion on cytologic smear cervix (lgsil)    +hpv, HAD HYSTERECTOMY  . OA (osteoarthritis)     Past Surgical History:  Procedure Laterality Date  . ABDOMINAL HYSTERECTOMY  2006  . BREAST CYST EXCISION     right  . BUNIONECTOMY     left  . CARPAL TUNNEL RELEASE Right   . ESOPHAGOGASTRODUODENOSCOPY  06/13/2012   Procedure: ESOPHAGOGASTRODUODENOSCOPY (EGD);  Surgeon: Rogene Houston, MD;  Location: [facility];  Service: Endoscopy;  Laterality: N/A;  200  . TRIGGER FINGER RELEASE Right 08/25/2016   Procedure: RELEASE TRIGGER FINGER/A-1 PULLEY RIGHT LONG TRIGGER FINGER RELEASE;  Surgeon: Carole Civil, MD;  Location: [facility];  Service: Orthopedics;  Laterality: Right;    No Known Allergies  Current Outpatient Medications on File Prior to Visit  Medication Sig Dispense Refill  . alendronate (FOSAMAX) 70 MG tablet Take 1 tablet (70 mg total) by mouth once a week. Take with a full glass of water on an  empty stomach on Sundays 12 tablet 0  . linaclotide (LINZESS) 72 MCG capsule Take 1 capsule (72 mcg total) by mouth daily before breakfast. 90 capsule 1  . Multiple Vitamin (MULTIVITAMIN WITH MINERALS) TABS tablet Take 1 tablet by mouth daily.    . naproxen sodium (ANAPROX) 220 MG tablet Take 220-440 mg by mouth 2 (two) times daily as needed (pain).     Marland Kitchen OVER THE COUNTER MEDICATION Take 1 tablet by mouth daily as needed (energy). Management consultant     . pantoprazole (PROTONIX) 40 MG tablet Take 1 tablet (40 mg total) by mouth daily. 90 tablet 0  . polyethylene glycol powder (GLYCOLAX/MIRALAX) powder Mix 1 capful (17 gm) in 8 oz of liquid and drink twice a day as needed 3162 g 0  . propranolol (INDERAL) 20 MG tablet Take 1 tablet by mouth twice a day 180 tablet 0   No current facility-administered medications on file prior to visit.         Objective:   Physical Exam Blood pressure (!) 150/80, pulse 72, temperature 98.4 F (36.9 C), height 5' 4.5" (1.638 m), weight 149 lb 14.4 oz (68 kg). Alert and oriented. Skin warm and dry. Oral mucosa is moist.   . Sclera anicteric, conjunctivae is pink. Thyroid not enlarged. No cervical lymphadenopathy. Lungs clear. Heart regular rate and rhythm.  Abdomen is soft. Bowel sounds are positive.  No hepatomegaly. No abdominal masses felt. No tenderness.  No edema to lower extremities. Patient is alert and oriented.       Assessment & Plan:  Constipation. Am going to increase her LInzess to 147mcg daily. Samples given.  OV in 1 year.

## 2017-12-04 ENCOUNTER — Telehealth: Payer: Self-pay | Admitting: Family Medicine

## 2017-12-04 ENCOUNTER — Other Ambulatory Visit: Payer: Self-pay | Admitting: Family Medicine

## 2017-12-04 DIAGNOSIS — K5900 Constipation, unspecified: Secondary | ICD-10-CM

## 2017-12-04 DIAGNOSIS — R251 Tremor, unspecified: Secondary | ICD-10-CM

## 2017-12-04 DIAGNOSIS — Z23 Encounter for immunization: Secondary | ICD-10-CM

## 2017-12-04 DIAGNOSIS — I10 Essential (primary) hypertension: Secondary | ICD-10-CM

## 2017-12-04 DIAGNOSIS — K219 Gastro-esophageal reflux disease without esophagitis: Secondary | ICD-10-CM

## 2017-12-04 MED ORDER — PANTOPRAZOLE SODIUM 40 MG PO TBEC: 40.0000 mg | DELAYED_RELEASE_TABLET | Freq: Every day | ORAL | 0 refills | Status: DC

## 2017-12-04 NOTE — Telephone Encounter (Signed)
Prescription sent to pharmacy.

## 2017-12-04 NOTE — Telephone Encounter (Signed)
Pt needs 1 refill on protonix to [facility]. Please send other refills [facility]

## 2017-12-13 ENCOUNTER — Ambulatory Visit: Payer: PPO

## 2017-12-14 ENCOUNTER — Other Ambulatory Visit: Payer: Self-pay

## 2017-12-14 ENCOUNTER — Ambulatory Visit (INDEPENDENT_AMBULATORY_CARE_PROVIDER_SITE_OTHER): Payer: PPO | Admitting: *Deleted

## 2017-12-14 DIAGNOSIS — M810 Age-related osteoporosis without current pathological fracture: Secondary | ICD-10-CM | POA: Diagnosis not present

## 2017-12-14 MED ORDER — DENOSUMAB 60 MG/ML ~~LOC~~ SOSY
60.0000 mg | PREFILLED_SYRINGE | Freq: Once | SUBCUTANEOUS | Status: AC
  Administered 2017-12-14: 60 mg via SUBCUTANEOUS

## 2017-12-14 NOTE — Progress Notes (Signed)
Patient seen in office for Prolia Injection.   Tolerated SQ administration well in R Arm.

## 2018-04-02 ENCOUNTER — Other Ambulatory Visit: Payer: Self-pay

## 2018-04-02 ENCOUNTER — Encounter (HOSPITAL_COMMUNITY): Payer: Self-pay | Admitting: *Deleted

## 2018-04-02 ENCOUNTER — Emergency Department (HOSPITAL_COMMUNITY): Payer: PPO

## 2018-04-02 ENCOUNTER — Emergency Department (HOSPITAL_COMMUNITY)
Admission: EM | Admit: 2018-04-02 | Discharge: 2018-04-02 | Disposition: A | Discharge status: Home / Self Care | Payer: PPO | Attending: Emergency Medicine | Admitting: Emergency Medicine

## 2018-04-02 DIAGNOSIS — H04123 Dry eye syndrome of bilateral lacrimal glands: Secondary | ICD-10-CM | POA: Diagnosis not present

## 2018-04-02 DIAGNOSIS — Z79899 Other long term (current) drug therapy: Secondary | ICD-10-CM | POA: Insufficient documentation

## 2018-04-02 DIAGNOSIS — K5900 Constipation, unspecified: Secondary | ICD-10-CM | POA: Insufficient documentation

## 2018-04-02 DIAGNOSIS — I10 Essential (primary) hypertension: Secondary | ICD-10-CM | POA: Diagnosis not present

## 2018-04-02 DIAGNOSIS — R1084 Generalized abdominal pain: Secondary | ICD-10-CM | POA: Diagnosis not present

## 2018-04-02 DIAGNOSIS — Z87891 Personal history of nicotine dependence: Secondary | ICD-10-CM | POA: Insufficient documentation

## 2018-04-02 HISTORY — DX: Constipation, unspecified: K59.00

## 2018-04-02 LAB — COMPREHENSIVE METABOLIC PANEL
ALK PHOS: 54 U/L (ref 38–126)
ALT: 22 U/L (ref 0–44)
ANION GAP: 9 (ref 5–15)
AST: 30 U/L (ref 15–41)
Albumin: 4.1 g/dL (ref 3.5–5.0)
BUN: 13 mg/dL (ref 8–23)
CALCIUM: 10.6 mg/dL — AB (ref 8.9–10.3)
CHLORIDE: 104 mmol/L (ref 98–111)
CO2: 25 mmol/L (ref 22–32)
CREATININE: 0.98 mg/dL (ref 0.44–1.00)
GFR calc Af Amer: >60 mL/min (ref >60)
GFR calc non Af Amer: 56 mL/min — ABNORMAL LOW (ref >60)
GLUCOSE: 101 mg/dL — AB (ref 70–99)
Potassium: 4.6 mmol/L (ref 3.5–5.1)
SODIUM: 138 mmol/L (ref 135–145)
Total Bilirubin: 1 mg/dL (ref 0.3–1.2)
Total Protein: 8.9 g/dL — ABNORMAL HIGH (ref 6.5–8.1)

## 2018-04-02 LAB — CBC WITH DIFFERENTIAL/PLATELET
ABS IMMATURE GRANULOCYTES: 0.03 10*3/uL (ref 0.00–0.07)
BASOS ABS: 0.1 10*3/uL (ref 0.0–0.1)
Basophils Relative: 1 %
EOS ABS: 0.2 10*3/uL (ref 0.0–0.5)
EOS PCT: 2 %
HCT: 46.4 % — ABNORMAL HIGH (ref 36.0–46.0)
HEMOGLOBIN: 14.4 g/dL (ref 12.0–15.0)
IMMATURE GRANULOCYTES: 0 %
Lymphocytes Relative: 30 %
Lymphs Abs: 2.2 10*3/uL (ref 0.7–4.0)
MCH: 27.5 pg (ref 26.0–34.0)
MCHC: 31 g/dL (ref 30.0–36.0)
MCV: 88.5 fL (ref 80.0–100.0)
Monocytes Absolute: 0.6 10*3/uL (ref 0.1–1.0)
Monocytes Relative: 8 %
NRBC: 0 % (ref 0.0–0.2)
Neutro Abs: 4.2 10*3/uL (ref 1.7–7.7)
Neutrophils Relative %: 59 %
PLATELETS: 221 10*3/uL (ref 150–400)
RBC: 5.24 MIL/uL — AB (ref 3.87–5.11)
RDW: 14.1 % (ref 11.5–15.5)
WBC: 7.3 10*3/uL (ref 4.0–10.5)

## 2018-04-02 LAB — URINALYSIS, ROUTINE W REFLEX MICROSCOPIC
BILIRUBIN URINE: NEGATIVE
Glucose, UA: NEGATIVE mg/dL
Hgb urine dipstick: NEGATIVE
Ketones, ur: NEGATIVE mg/dL
Leukocytes, UA: NEGATIVE
NITRITE: NEGATIVE
PH: 6 (ref 5.0–8.0)
Protein, ur: NEGATIVE mg/dL
Specific Gravity, Urine: <1.005 — ABNORMAL LOW (ref 1.005–1.030)

## 2018-04-02 LAB — LIPASE, BLOOD: Lipase: 46 U/L (ref 11–51)

## 2018-04-02 MED ORDER — MAGNESIUM CITRATE PO SOLN: 1.0000 | Freq: Once | ORAL | 0 refills | Status: AC

## 2018-04-02 MED ORDER — POLYETHYLENE GLYCOL 3350 17 G PO PACK: 17.0000 g | PACK | Freq: Every day | ORAL | 0 refills | Status: DC

## 2018-04-02 MED ORDER — DOCUSATE SODIUM 100 MG PO CAPS: 100.0000 mg | ORAL_CAPSULE | Freq: Two times a day (BID) | ORAL | 0 refills | Status: DC

## 2018-04-02 MED ORDER — HYOSCYAMINE SULFATE SL 0.125 MG SL SUBL: 0.4000 mg | SUBLINGUAL_TABLET | SUBLINGUAL | 0 refills | Status: DC | PRN

## 2018-04-02 NOTE — Discharge Instructions (Addendum)
Continue taking your Linzess.  Take 1 dose of the magnesium citrate.  Start taking the Colace and restart MiraLAX daily.  Please schedule follow-up with Dr. Laural Golden as soon as possible.

## 2018-04-02 NOTE — ED Triage Notes (Signed)
Pt c/o generalized abd pain that has been constant for the past 5 days, denies any n/v, last bowel movement was today with a "little"

## 2018-04-02 NOTE — ED Provider Notes (Signed)
[facility] EMERGENCY DEPARTMENT Provider Note   CSN: [id number] Arrival date & time: 04/02/18  0035     History   Chief Complaint Chief Complaint  Patient presents with  . Abdominal Pain    HPI Rachel [name] is a 72 y.o. female.  Patient presents to the emergency department for evaluation of abdominal pain.  Patient reports that symptoms have been ongoing for 5 days.  She reports that during the day she has diffuse abdominal pain and cramping that gets better in the evening.  Currently it has essentially resolved.  She has a history of chronic constipation.  She previously used MiraLAX and then was placed on Linzess.  She reports that initially Linzess helped, but now she is having difficulty having bowel movements.  She has not had a normal bowel movement in 5 days.  No nausea or vomiting.  No chest pain.  No fever.     Past Medical History:  Diagnosis Date  . Back pain   . Carpal tunnel syndrome   . Constipation   . Hypertension   . Low grade squamous intraepith lesion on cytologic smear cervix (lgsil)    +hpv, HAD HYSTERECTOMY  . OA (osteoarthritis)     Patient Active Problem List   Diagnosis Date Noted  . Encounter for gynecological examination with Papanicolaou smear of cervix 09/08/2015  . Abnormal Pap smear of vagina 10/15/2014  . Acquired trigger finger 09/03/2014  . Constipation 04/04/2014  . VAIN (vaginal intraepithelial neoplasia) 06/20/2013  . Knee pain, right 06/14/2013  . Allergic rhinitis 08/22/2012  . Neuropathy, arm 02/19/2012  . GERD (gastroesophageal reflux disease) 02/19/2012  . Alopecia 02/19/2012  . Anxiety 10/03/2011  . Tremor 10/03/2011  . Tobacco use 10/03/2011  . OA (osteoarthritis) 08/11/2011  . Cancer ([place]) 08/11/2011  . Essential hypertension, benign 08/09/2011  . Vitamin D deficiency 08/09/2011    Past Surgical History:  Procedure Laterality Date  . ABDOMINAL HYSTERECTOMY  2006  . BREAST CYST EXCISION     right  .  BUNIONECTOMY     left  . CARPAL TUNNEL RELEASE Right   . ESOPHAGOGASTRODUODENOSCOPY  06/13/2012   Procedure: ESOPHAGOGASTRODUODENOSCOPY (EGD);  Surgeon: Rogene Houston, MD;  Location[facility];  Service: Endoscopy;  Laterality: N/A;  200  . TRIGGER FINGER RELEASE Right 08/25/2016   Procedure: RELEASE TRIGGER FINGER/A-1 PULLEY RIGHT LONG TRIGGER FINGER RELEASE;  Surgeon: Carole Civil, MD;  Location: [facility];  Service: Orthopedics;  Laterality: Right;     OB History   None      Home Medications    Prior to Admission medications   Medication Sig Start Date End Date Taking? Authorizing Provider  alendronate (FOSAMAX) 70 MG tablet Take 1 tablet (70 mg total) by mouth once a week. Take with a full glass of water on an empty stomach on Sundays 08/02/17  Yes [place], Modena Nunnery, MD  linaclotide [facility]) 72 MCG capsule Take 1 capsule (72 mcg total) by mouth daily before breakfast. Patient taking differently: Take 145 mcg by mouth daily before breakfast.  11/06/17  Yes [place], Modena Nunnery, MD  Multiple Vitamin (MULTIVITAMIN WITH MINERALS) TABS tablet Take 1 tablet by mouth daily.   Yes [provider]  naproxen sodium (ANAPROX) 220 MG tablet Take 220-440 mg by mouth 2 (two) times daily as needed (pain).    Yes [provider]  OVER THE COUNTER MEDICATION Take 1 tablet by mouth daily as needed (energy). Jet Alert Energy Support    Yes  [provider]  pantoprazole (PROTONIX) 40 MG tablet Take 1 tablet (40 mg total) by mouth daily. 12/04/17  Yes [place], Modena Nunnery, MD  propranolol (INDERAL) 20 MG tablet Take 1 tablet by mouth twice a day 11/21/17  Yes [place], Modena Nunnery, MD  docusate sodium (COLACE) 100 MG capsule Take 1 capsule (100 mg total) by mouth every 12 (twelve) hours. 04/02/18   Orpah Greek, MD  Hyoscyamine Sulfate SL (LEVSIN/SL) 0.125 MG SUBL Place 0.4 mg under the tongue every 4 (four) hours as needed (abd pain/spasm). 04/02/18   Orpah Greek, MD   magnesium citrate SOLN Take 296 mLs (1 Bottle total) by mouth once for 1 dose. 04/02/18 04/02/18  Orpah Greek, MD  polyethylene glycol (MIRALAX / GLYCOLAX) packet Take 17 g by mouth daily. 04/02/18   Orpah Greek, MD    Family History Family History  Problem Relation Age of Onset  . Diabetes Father   . Cancer Father        prostate cancer  . Cancer Sister        breast  . Hypertension Son   . Cancer Maternal Grandmother   . Eczema Daughter   . Other Daughter        stomach issues    Social History Social History   Tobacco Use  . Smoking status: Former Smoker    Packs/day: 0.25    Years: 50.00    Pack years: 12.50    Types: Cigarettes    Last attempt to quit: 07/28/2013    Years since quitting: 4.6  . Smokeless tobacco: Never Used  Substance Use Topics  . Alcohol use: Yes    Alcohol/week: 0.0 standard drinks    Comment: occ  . Drug use: No     Allergies   Patient has no known allergies.   Review of Systems Review of Systems  Gastrointestinal: Positive for abdominal pain and constipation.  All other systems reviewed and are negative.    Physical Exam Updated Vital Signs BP (!) 130/91 (BP Location: Left Arm)   Pulse 77   Temp 98.6 F (37 C) (Oral)   Resp 16   Ht 5' 4.5" (1.638 m)   Wt 68 kg   SpO2 96%   BMI 25.35 kg/m   Physical Exam  Constitutional: She is oriented to person, place, and time. She appears well-developed and well-nourished. No distress.  HENT:  Head: Normocephalic and atraumatic.  Right Ear: Hearing normal.  Left Ear: Hearing normal.  Nose: Nose normal.  Mouth/Throat: Oropharynx is clear and moist and mucous membranes are normal.  Eyes: Pupils are equal, round, and reactive to light. Conjunctivae and EOM are normal.  Neck: Normal range of motion. Neck supple.  Cardiovascular: Regular rhythm, S1 normal and S2 normal. Exam reveals no gallop and no friction rub.  No murmur heard. Pulmonary/Chest: Effort normal and  breath sounds normal. No respiratory distress. She exhibits no tenderness.  Abdominal: Soft. Normal appearance and bowel sounds are normal. There is no hepatosplenomegaly. There is no tenderness. There is no rebound, no guarding, no tenderness at McBurney's point and negative Murphy's sign. No hernia.  Musculoskeletal: Normal range of motion.  Neurological: She is alert and oriented to person, place, and time. She has normal strength. No cranial nerve deficit or sensory deficit. Coordination normal. GCS eye subscore is 4. GCS verbal subscore is 5. GCS motor subscore is 6.  Skin: Skin is warm, dry and intact. No rash noted. No cyanosis.  Psychiatric: She  has a normal mood and affect. Her speech is normal and behavior is normal. Thought content normal.  Nursing note and vitals reviewed.    ED Treatments / Results  Labs (all labs ordered are listed, but only abnormal results are displayed) Labs Reviewed  CBC WITH DIFFERENTIAL/PLATELET - Abnormal; Notable for the following components:      Result Value   RBC 5.24 (*)    HCT 46.4 (*)    All other components within normal limits  COMPREHENSIVE METABOLIC PANEL - Abnormal; Notable for the following components:   Glucose, Bld 101 (*)    Calcium 10.6 (*)    Total Protein 8.9 (*)    GFR calc non Af Amer 56 (*)    All other components within normal limits  URINALYSIS, ROUTINE W REFLEX MICROSCOPIC - Abnormal; Notable for the following components:   Specific Gravity, Urine <1.005 (*)    All other components within normal limits  LIPASE, BLOOD    EKG None  Radiology Dg Abd Acute W/chest  Result Date: 04/02/2018 CLINICAL DATA:  Abdominal pain and constipation EXAM: DG ABDOMEN ACUTE W/ 1V CHEST COMPARISON:  None. FINDINGS: There is no evidence of dilated bowel loops or free intraperitoneal air. No radiopaque calculi or other significant radiographic abnormality is seen. Heart size and mediastinal contours are within normal limits. Both lungs are  clear. IMPRESSION: Negative abdominal radiographs.  No acute cardiopulmonary disease. Electronically Signed   By: Ulyses Jarred M.D.   On: 04/02/2018 02:04    Procedures Procedures (including critical care time)  Medications Ordered in ED Medications - No data to display   Initial Impression / Assessment and Plan / ED Course  I have reviewed the triage vital signs and the nursing notes.  Pertinent labs & imaging results that were available during my care of the patient were reviewed by me and considered in my medical decision making (see chart for details).     Patient presents to the emergency department with complaints of abdominal pain.  Patient reports that she has not been having normal bowel movements this week and has a history of constipation and similar abdominal pain.  She has been taking MiraLAX but it did not help, 5 months ago was put on Linzess.  She reports initially it helped, but now she feels constipated again.  She reports that she is not currently feeling the abdominal pain.  Her abdominal exam is benign, nontender.  No concern for acute surgical process.  Blood work is entirely normal, very reassuring.  X-ray does show some stool throughout the colon but no signs of obstruction.  Will provide symptomatic relief for constipation, patient urged to follow-up with her gastroenterologist which she has not done since the [place] was started 5 months ago.  Final Clinical Impressions(s) / ED Diagnoses   Final diagnoses:  Generalized abdominal pain  Constipation, unspecified constipation type    ED Discharge Orders         Ordered    docusate sodium (COLACE) 100 MG capsule  Every 12 hours     04/02/18 0232    magnesium citrate SOLN   Once     04/02/18 0232    polyethylene glycol (MIRALAX / GLYCOLAX) packet  Daily     04/02/18 0232    Hyoscyamine Sulfate SL (LEVSIN/SL) 0.125 MG SUBL  Every 4 hours PRN     04/02/18 0232           Orpah Greek,  MD 04/02/18 [phone]

## 2018-04-24 ENCOUNTER — Other Ambulatory Visit: Payer: Self-pay

## 2018-04-24 ENCOUNTER — Encounter: Payer: Self-pay | Admitting: Family Medicine

## 2018-04-24 ENCOUNTER — Ambulatory Visit (INDEPENDENT_AMBULATORY_CARE_PROVIDER_SITE_OTHER): Payer: PPO | Admitting: Family Medicine

## 2018-04-24 VITALS — BP 142/88 | HR 88 | Temp 98.9°F | Resp 14 | Ht 64.5 in | Wt 152.0 lb

## 2018-04-24 DIAGNOSIS — K5900 Constipation, unspecified: Secondary | ICD-10-CM

## 2018-04-24 DIAGNOSIS — Z23 Encounter for immunization: Secondary | ICD-10-CM | POA: Diagnosis not present

## 2018-04-24 DIAGNOSIS — M810 Age-related osteoporosis without current pathological fracture: Secondary | ICD-10-CM

## 2018-04-24 DIAGNOSIS — I10 Essential (primary) hypertension: Secondary | ICD-10-CM

## 2018-04-24 DIAGNOSIS — K219 Gastro-esophageal reflux disease without esophagitis: Secondary | ICD-10-CM

## 2018-04-24 NOTE — Assessment & Plan Note (Signed)
Continue protonix, adds tums prn

## 2018-04-24 NOTE — Patient Instructions (Addendum)
Keep a check on your blood pressure  We will get you an appointment with GI  Call me back on how much calcium you are taking  Cancel December 13th appt with me, Cancel Prolia Jan Schedule appt for End of January- OV and Prolia shot at same time

## 2018-04-24 NOTE — Assessment & Plan Note (Signed)
Holding linzess/miralax Continue MOM/ colace Referral to GI, incomplete emptying, due for scope

## 2018-04-24 NOTE — Assessment & Plan Note (Addendum)
mildy elevated, admits to taking OTC cough med nyquil x 2 doses for congestion Check BP at home can call us in a few weeks

## 2018-04-24 NOTE — Assessment & Plan Note (Signed)
Continue prolia She will call back with how much calcium she is taking, will need to reduce, due to mild hypercalcemia on labs

## 2018-04-24 NOTE — Progress Notes (Signed)
   Subjective:    Patient ID: Rachel [name], female    DOB: 1945-08-12, 72 y.o.   MRN: [id number]  Patient presents for ER F/U (constipation)   Pt here for ER follow-up seen on 11/4. Has history of constipation has been on Linzess and Miralax. Some days would have good bowel movements, other days just cramps  She had been 3 days without going to restroom She was given mag citrate and colace. This helped but still not completely emptying her bowels. Still feels bloated and gassy No blood in the stool no N/V. No urinary symptoms     She does have some reflux issues, she on protonix as well, but get break through symptoms    Prolia  Review Of Systems:  GEN- denies fatigue, fever, weight loss,weakness, recent illness HEENT- denies eye drainage, change in vision, nasal discharge, CVS- denies chest pain, palpitations RESP- denies SOB, cough, wheeze ABD- denies N/V, +change in stools, abd pain GU- denies dysuria, hematuria, dribbling, incontinence MSK- denies joint pain, muscle aches, injury Neuro- denies headache, dizziness, syncope, seizure activity       Objective:    BP (!) 142/88   Pulse 88   Temp 98.9 F (37.2 C) (Oral)   Resp 14   Ht 5' 4.5" (1.638 m)   Wt 152 lb (68.9 kg)   SpO2 98%   BMI 25.69 kg/m  GEN- NAD, alert and oriented x3 HEENT- PERRL, EOMI, non injected sclera, pink conjunctiva, MMM, oropharynx clear,nares clear Neck- Supple,  CVS- RRR, no murmur RESP-CTAB ABD-NABS,soft,NT,ND EXT- No edema Pulses- Radial 2+        Assessment & Plan:      Problem List Items Addressed This Visit      Unprioritized   Constipation    Holding linzess/miralax Continue MOM/ colace Referral to GI, incomplete emptying, due for scope      Essential hypertension, benign (Chronic)    mildy elevated, admits to taking OTC cough med nyquil x 2 doses for congestion Check BP at home can call us in a few weeks      GERD (gastroesophageal reflux disease) - Primary  (Chronic)    Continue protonix, adds tums prn      Relevant Medications   magnesium citrate SOLN   Osteoporosis    Continue prolia She will call back with how much calcium she is taking, will need to reduce, due to mild hypercalcemia on labs       Other Visit Diagnoses    Hypercalcemia       Need for prophylactic vaccination and inoculation against influenza       Relevant Orders   Flu vaccine HIGH DOSE PF (Completed)      Note: This dictation was prepared with Dragon dictation along with smaller phrase technology. Any transcriptional errors that result from this process are unintentional.

## 2018-05-02 ENCOUNTER — Ambulatory Visit: Payer: PPO | Admitting: Obstetrics and Gynecology

## 2018-05-02 ENCOUNTER — Telehealth: Payer: Self-pay | Admitting: *Deleted

## 2018-05-02 ENCOUNTER — Other Ambulatory Visit (HOSPITAL_COMMUNITY)
Admission: RE | Admit: 2018-05-02 | Discharge: 2018-05-02 | Disposition: A | Discharge status: Home / Self Care | Payer: PPO | Source: Ambulatory Visit | Attending: Obstetrics and Gynecology | Admitting: Obstetrics and Gynecology

## 2018-05-02 ENCOUNTER — Encounter: Payer: Self-pay | Admitting: Obstetrics and Gynecology

## 2018-05-02 ENCOUNTER — Other Ambulatory Visit: Payer: Self-pay

## 2018-05-02 ENCOUNTER — Encounter (INDEPENDENT_AMBULATORY_CARE_PROVIDER_SITE_OTHER): Payer: Self-pay

## 2018-05-02 VITALS — BP 141/87 | HR 61 | Ht 64.0 in | Wt 151.0 lb

## 2018-05-02 DIAGNOSIS — Z124 Encounter for screening for malignant neoplasm of cervix: Secondary | ICD-10-CM | POA: Insufficient documentation

## 2018-05-02 DIAGNOSIS — Z1211 Encounter for screening for malignant neoplasm of colon: Secondary | ICD-10-CM

## 2018-05-02 DIAGNOSIS — K5904 Chronic idiopathic constipation: Secondary | ICD-10-CM

## 2018-05-02 DIAGNOSIS — K219 Gastro-esophageal reflux disease without esophagitis: Secondary | ICD-10-CM

## 2018-05-02 DIAGNOSIS — Z01419 Encounter for gynecological examination (general) (routine) without abnormal findings: Secondary | ICD-10-CM | POA: Diagnosis not present

## 2018-05-02 NOTE — Telephone Encounter (Signed)
Call placed to patient. No answer. No VM.  

## 2018-05-02 NOTE — Telephone Encounter (Signed)
She should be taking protonix for the acid reflux, she can take additional pepcid if needed Referral for both GERD and Constipation, the GI doctor will have to decide if she needs the EGD, I cant make that decision

## 2018-05-02 NOTE — Telephone Encounter (Signed)
Received call from patient.   States that she was to call back with calcium supplements d/t high levels in blood. Reports that she is currently taking MVI with calcium, and OTC Calcium 600mg /Vit D 400IU.   Also reports that she is having a lot of reflux and burning in her stomach. She is currently treating with Pepcid, and requested MD to request upper GI endoscopy with colonoscopy.   MD please advise.

## 2018-05-02 NOTE — Telephone Encounter (Signed)
Okay to continue the multivitamin Stop the other calcium D supplement Get vitamin D 1000IU tablet by itself

## 2018-05-02 NOTE — Progress Notes (Signed)
Patient ID: Rachel [name], female   DOB: 17-Jun-1945, 72 y.o.   MRN: [id number]   Assessment:  Annual Gyn Exam Plan:  1. pap smear done, next pap due 2 years 2. return annually or prn 3    Annual mammogram advised after age 69 Subjective:  Rachel [name] is a 72 y.o. female No obstetric history on file. who presents for annual exam. No LMP recorded. Patient has had a hysterectomy. The patient has complaints today of none. Has hx of abnormal pap, received some treatment. After hysterectomy was still diagnosed with abnormal pap smears..  The following portions of the patient's history were reviewed and updated as appropriate: allergies, current medications, past family history, past medical history, past social history, past surgical history and problem list. Past Medical History:  Diagnosis Date  . Back pain   . Carpal tunnel syndrome   . Constipation   . Hypertension   . Low grade squamous intraepith lesion on cytologic smear cervix (lgsil)    +hpv, HAD HYSTERECTOMY  . OA (osteoarthritis)     Past Surgical History:  Procedure Laterality Date  . ABDOMINAL HYSTERECTOMY  2006  . BREAST CYST EXCISION     right  . BUNIONECTOMY     left  . CARPAL TUNNEL RELEASE Right   . ESOPHAGOGASTRODUODENOSCOPY  06/13/2012   Procedure: ESOPHAGOGASTRODUODENOSCOPY (EGD);  Surgeon: Rogene Houston, MD;  Location: [facility];  Service: Endoscopy;  Laterality: N/A;  200  . TRIGGER FINGER RELEASE Right 08/25/2016   Procedure: RELEASE TRIGGER FINGER/A-1 PULLEY RIGHT LONG TRIGGER FINGER RELEASE;  Surgeon: Carole Civil, MD;  Location: [facility];  Service: Orthopedics;  Laterality: Right;     Current Outpatient Medications:  .  docusate sodium (COLACE) 100 MG capsule, Take 1 capsule (100 mg total) by mouth every 12 (twelve) hours., Disp: 60 capsule, Rfl: 0 .  magnesium citrate SOLN, Take 1 Bottle by mouth once., Disp: , Rfl:  .  Multiple Vitamin (MULTIVITAMIN WITH MINERALS) TABS tablet, Take 1  tablet by mouth daily., Disp: , Rfl:  .  naproxen sodium (ANAPROX) 220 MG tablet, Take 220-440 mg by mouth 2 (two) times daily as needed (pain). , Disp: , Rfl:  .  OVER THE COUNTER MEDICATION, Take 1 tablet by mouth daily as needed (energy). Jet Alert Energy Support , Disp: , Rfl:  .  pantoprazole (PROTONIX) 40 MG tablet, Take 1 tablet (40 mg total) by mouth daily., Disp: 90 tablet, Rfl: 0 .  propranolol (INDERAL) 20 MG tablet, Take 1 tablet by mouth twice a day, Disp: 180 tablet, Rfl: 0  Review of Systems Constitutional: negative Gastrointestinal: negative Genitourinary: normal  Objective:  There were no vitals taken for this visit.   BMI: There is no height or weight on file to calculate BMI.  General Appearance: Alert, appropriate appearance for age. No acute distress HEENT: Grossly normal Neck / Thyroid:  Cardiovascular: RRR; normal S1, S2, no murmur Lungs: CTA bilaterally Back: No CVAT Breast Exam: No masses or nodes.No dimpling, nipple retraction or discharge, does self exams and felt knots but when she went in for mammogram tissue was reviewed as normal. Even tissue. Small 1 cm of firmness of latter aspect on left breast Gastrointestinal: Soft, non-tender, no masses or organomegaly Pelvic Exam:  VAGINA: normal, good healing  CERVIX: surgically absent good support UTERUS: surgically absent PAP: Pap smear done today. RECTAL: negative without mass, lesions or tenderness, stool guaiac negative. Lymphatic Exam: Non-palpable nodes in neck, clavicular, axillary, or inguinal  regions  Skin: no rash or abnormalities Neurologic: Normal gait and speech, no tremor  Psychiatric: Alert and oriented, appropriate affect.  Urinalysis:Not done  By signing my name below, I, Samul Dada, attest that this documentation has been prepared under the direction and in the presence of [name], MD. Electronically Signed: [place]. 05/02/18. 3:32 PM.  I personally  performed the services described in this documentation, which was SCRIBED in my presence. The recorded information has been reviewed and considered accurate. It has been edited as necessary during review. [name], MD

## 2018-05-03 NOTE — Telephone Encounter (Signed)
Call placed to patient. Line noted busy.  

## 2018-05-04 LAB — CYTOLOGY - PAP
Diagnosis: NEGATIVE
HPV: DETECTED — AB

## 2018-05-04 NOTE — Telephone Encounter (Signed)
Call placed to patient. No answer. No VM.  

## 2018-05-07 ENCOUNTER — Telehealth: Payer: Self-pay | Admitting: *Deleted

## 2018-05-07 NOTE — Telephone Encounter (Signed)
Call placed to patient. No answer. No VM.  

## 2018-05-07 NOTE — Telephone Encounter (Signed)
Patient informed HPV positive on PAP, so needs repeat pap in ONE YEAR. Verbalized understanding and all questions answered.

## 2018-05-09 NOTE — Telephone Encounter (Signed)
Patient returned call and made aware.

## 2018-05-11 ENCOUNTER — Ambulatory Visit: Payer: PPO | Admitting: Family Medicine

## 2018-05-15 ENCOUNTER — Encounter (INDEPENDENT_AMBULATORY_CARE_PROVIDER_SITE_OTHER): Payer: Self-pay | Admitting: Internal Medicine

## 2018-05-15 ENCOUNTER — Ambulatory Visit (INDEPENDENT_AMBULATORY_CARE_PROVIDER_SITE_OTHER): Payer: PPO | Admitting: Internal Medicine

## 2018-05-15 VITALS — BP 142/90 | HR 72 | Temp 98.1°F | Ht 64.5 in | Wt 149.9 lb

## 2018-05-15 DIAGNOSIS — K219 Gastro-esophageal reflux disease without esophagitis: Secondary | ICD-10-CM | POA: Diagnosis not present

## 2018-05-15 DIAGNOSIS — K5900 Constipation, unspecified: Secondary | ICD-10-CM | POA: Diagnosis not present

## 2018-05-15 NOTE — Progress Notes (Signed)
Patient ID: [name], female   DOB: [birth year], 72 y.o.   MRN: [id number]

## 2018-05-15 NOTE — Progress Notes (Signed)
Subjective:    Patient ID: Rachel [name], female    DOB: [birth year], 72 y.o.   MRN: [id number]  HPI Referred by Dr. Buelah [name] for reflux/constipaiton . She was last seen in our office in July of this year for constipation. She tells me she is still have constipation. She tells me the Linzess cramped her stomach. She is using MOM, Colace and sometimes may once every 2 weeks the Mag citrate. If she doesn't take the laxatives she has the cramps. She has a BM daily taking the laxatives. She may have BM x 5 a day.  Rachel [name] been constipated for a good portion of her life. She tells me she is having some epigastric pain if she eat something spicy. Sometimes she still has the burning even if she doesn't eat something spicy.     Per records, last colonoscopy was in [facility] and she reports it was good.  Had EGD in 2014 (epigastric pain): Impression: Small sliding heart hernia with mild changes of reflux esophagitis limited to GE junction. Nonerosive antral gastritis along with pyloric channel inflammation and small scar. Biopsy taken from prepyloric mucosa for routine histology. Suspect these changes may be secondary to NSAID therapy.  Review of Systems Past Medical History:  Diagnosis Date  . Back pain   . Carpal tunnel syndrome   . Constipation   . Hypertension   . Low grade squamous intraepith lesion on cytologic smear cervix (lgsil)    +hpv, HAD HYSTERECTOMY  . OA (osteoarthritis)     Past Surgical History:  Procedure Laterality Date  . ABDOMINAL HYSTERECTOMY  2006  . BREAST CYST EXCISION     right  . BUNIONECTOMY     left  . CARPAL TUNNEL RELEASE Right   . ESOPHAGOGASTRODUODENOSCOPY  06/13/2012   Procedure: ESOPHAGOGASTRODUODENOSCOPY (EGD);  Surgeon: Rachel Houston, MD;  Location: [facility];  Service: Endoscopy;  Laterality: N/A;  200  . TRIGGER FINGER RELEASE Right 08/25/2016   Procedure: RELEASE TRIGGER FINGER/A-1 PULLEY RIGHT LONG TRIGGER FINGER RELEASE;  Surgeon: Rachel Civil, MD;  Location: [facility];  Service: Orthopedics;  Laterality: Right;    No Known Allergies  Current Outpatient Medications on File Prior to Visit  Medication Sig Dispense Refill  . docusate sodium (COLACE) 100 MG capsule Take 1 capsule (100 mg total) by mouth every 12 (twelve) hours. 60 capsule 0  . magnesium citrate SOLN Take 1 Bottle by mouth once.    . Multiple Vitamin (MULTIVITAMIN WITH MINERALS) TABS tablet Take 1 tablet by mouth daily.    . naproxen sodium (ANAPROX) 220 MG tablet Take 220-440 mg by mouth 2 (two) times daily as needed (pain).     Marland Kitchen OVER THE COUNTER MEDICATION Take 1 tablet by mouth daily as needed (energy). Rachel [name]     . pantoprazole (PROTONIX) 40 MG tablet Take 1 tablet (40 mg total) by mouth daily. 90 tablet 0  . propranolol (INDERAL) 20 MG tablet Take 1 tablet by mouth twice a day 180 tablet 0   No current facility-administered medications on file prior to visit.         Objective:   Physical Exam Blood pressure (!) 142/90, pulse 72, temperature 98.1 F (36.7 C), height 5' 4.5" (1.638 m), weight 149 lb 14.4 oz (68 kg). Alert and oriented. Skin warm and dry. Oral mucosa is moist.   . Sclera anicteric, conjunctivae is pink. Thyroid not enlarged. No cervical lymphadenopathy. Lungs clear. Heart regular rate and rhythm.  Abdomen is soft. Bowel sounds are positive. No hepatomegaly. No abdominal masses felt. No tenderness.  No edema to lower extremities.          Assessment & Plan:   GERD. Am going to try her on Dexilant x 30 days and try Pepcid OTC. Continue MOM and Colace. She will call with a PR in 4 weeks

## 2018-05-15 NOTE — Patient Instructions (Signed)
Dexilant samples. Call with a PR in 4 weeks. Take Pecid OTC at night.,

## 2018-05-24 ENCOUNTER — Other Ambulatory Visit: Payer: Self-pay | Admitting: Family Medicine

## 2018-05-24 DIAGNOSIS — R251 Tremor, unspecified: Secondary | ICD-10-CM

## 2018-05-24 DIAGNOSIS — K219 Gastro-esophageal reflux disease without esophagitis: Secondary | ICD-10-CM

## 2018-05-24 DIAGNOSIS — I10 Essential (primary) hypertension: Secondary | ICD-10-CM

## 2018-05-24 DIAGNOSIS — K5900 Constipation, unspecified: Secondary | ICD-10-CM

## 2018-05-24 DIAGNOSIS — Z23 Encounter for immunization: Secondary | ICD-10-CM

## 2018-05-28 ENCOUNTER — Ambulatory Visit (HOSPITAL_COMMUNITY)
Admission: RE | Admit: 2018-05-28 | Discharge: 2018-05-28 | Disposition: A | Discharge status: Home / Self Care | Payer: PPO | Source: Ambulatory Visit | Attending: Family Medicine | Admitting: Family Medicine

## 2018-05-28 ENCOUNTER — Encounter (HOSPITAL_COMMUNITY): Payer: Self-pay

## 2018-05-28 DIAGNOSIS — Z1239 Encounter for other screening for malignant neoplasm of breast: Secondary | ICD-10-CM

## 2018-05-30 HISTORY — PX: EYE SURGERY: SHX253

## 2018-06-06 ENCOUNTER — Other Ambulatory Visit (HOSPITAL_COMMUNITY): Payer: Self-pay | Admitting: Family Medicine

## 2018-06-12 ENCOUNTER — Telehealth (INDEPENDENT_AMBULATORY_CARE_PROVIDER_SITE_OTHER): Payer: Self-pay | Admitting: Internal Medicine

## 2018-06-12 NOTE — Telephone Encounter (Signed)
Patient called stated the medication you prescribed worked for her - she is out and would like a refill - please call her at [phone]

## 2018-06-13 NOTE — Telephone Encounter (Signed)
Samples to front desk

## 2018-06-14 ENCOUNTER — Telehealth: Payer: Self-pay | Admitting: Obstetrics and Gynecology

## 2018-06-14 NOTE — Telephone Encounter (Signed)
Patient called, she is requesting an order for a diagnostic mammogram.  [phone]

## 2018-06-14 NOTE — Telephone Encounter (Signed)
Pt states that she has a small area of concern on her left breast. She states that she went for her mammogram and nothing was seen. She states that the ultrasonographer suggested she call here and get an order places for a diagnostic mammogram. Advised that I would send her request to a provider and she could call mammography tomorrow to try and schedule one. Pt verbalized understanding.

## 2018-06-19 ENCOUNTER — Telehealth (INDEPENDENT_AMBULATORY_CARE_PROVIDER_SITE_OTHER): Payer: Self-pay | Admitting: Internal Medicine

## 2018-06-19 NOTE — Telephone Encounter (Signed)
Patient called stated the pharmacy did not receive her prescription

## 2018-06-20 ENCOUNTER — Telehealth: Payer: Self-pay | Admitting: Obstetrics and Gynecology

## 2018-06-20 ENCOUNTER — Other Ambulatory Visit (HOSPITAL_COMMUNITY): Payer: Self-pay | Admitting: Obstetrics and Gynecology

## 2018-06-20 DIAGNOSIS — N6320 Unspecified lump in the left breast, unspecified quadrant: Secondary | ICD-10-CM

## 2018-06-20 NOTE — Telephone Encounter (Signed)
Informed pt that order for diagnostic mammogram had been put in. Advised pt that appt was made for Tuesday Feb 4 @ 340, she should arrive around 315 to [facility]. Pt verbalized understanding.

## 2018-06-20 NOTE — Telephone Encounter (Signed)
Patient called, stated that she hasn't heard anything about an order for a diagnostic mammogram and she has been trying to get this for about 4 weeks.  She was a little frustrated.  [phone]

## 2018-06-20 NOTE — Telephone Encounter (Signed)
She has samples at front desk. She is aware. She had forgotten

## 2018-06-21 ENCOUNTER — Ambulatory Visit: Payer: PPO

## 2018-06-27 ENCOUNTER — Ambulatory Visit: Payer: PPO | Admitting: Family Medicine

## 2018-07-03 ENCOUNTER — Ambulatory Visit (HOSPITAL_COMMUNITY)
Admission: RE | Admit: 2018-07-03 | Discharge: 2018-07-03 | Disposition: A | Discharge status: Home / Self Care | Payer: PPO | Source: Ambulatory Visit | Attending: Obstetrics and Gynecology | Admitting: Obstetrics and Gynecology

## 2018-07-03 DIAGNOSIS — N6489 Other specified disorders of breast: Secondary | ICD-10-CM | POA: Diagnosis not present

## 2018-07-03 DIAGNOSIS — R928 Other abnormal and inconclusive findings on diagnostic imaging of breast: Secondary | ICD-10-CM | POA: Diagnosis not present

## 2018-07-03 DIAGNOSIS — N6320 Unspecified lump in the left breast, unspecified quadrant: Secondary | ICD-10-CM | POA: Diagnosis not present

## 2018-07-04 ENCOUNTER — Telehealth: Payer: Self-pay | Admitting: Obstetrics and Gynecology

## 2018-07-04 NOTE — Telephone Encounter (Signed)
Benign mammogram findings discussed, pt reminded to get tested promptly in 2021.

## 2018-07-06 ENCOUNTER — Encounter: Payer: Self-pay | Admitting: Family Medicine

## 2018-07-06 ENCOUNTER — Ambulatory Visit (INDEPENDENT_AMBULATORY_CARE_PROVIDER_SITE_OTHER): Payer: PPO | Admitting: Family Medicine

## 2018-07-06 ENCOUNTER — Other Ambulatory Visit: Payer: Self-pay

## 2018-07-06 VITALS — BP 140/90 | HR 82 | Temp 98.1°F | Resp 14 | Ht 64.5 in | Wt 153.0 lb

## 2018-07-06 DIAGNOSIS — M5410 Radiculopathy, site unspecified: Secondary | ICD-10-CM | POA: Diagnosis not present

## 2018-07-06 DIAGNOSIS — M51369 Other intervertebral disc degeneration, lumbar region without mention of lumbar back pain or lower extremity pain: Secondary | ICD-10-CM

## 2018-07-06 DIAGNOSIS — M810 Age-related osteoporosis without current pathological fracture: Secondary | ICD-10-CM

## 2018-07-06 DIAGNOSIS — K5900 Constipation, unspecified: Secondary | ICD-10-CM

## 2018-07-06 DIAGNOSIS — K219 Gastro-esophageal reflux disease without esophagitis: Secondary | ICD-10-CM | POA: Diagnosis not present

## 2018-07-06 DIAGNOSIS — M5136 Other intervertebral disc degeneration, lumbar region: Secondary | ICD-10-CM

## 2018-07-06 MED ORDER — DEXLANSOPRAZOLE 60 MG PO CPDR: 60.0000 mg | DELAYED_RELEASE_CAPSULE | Freq: Every day | ORAL | 2 refills | Status: DC

## 2018-07-06 MED ORDER — DENOSUMAB 60 MG/ML ~~LOC~~ SOSY
60.0000 mg | PREFILLED_SYRINGE | Freq: Once | SUBCUTANEOUS | Status: AC
  Administered 2018-07-06: 60 mg via SUBCUTANEOUS

## 2018-07-06 MED ORDER — TRAMADOL HCL 50 MG PO TABS: 50.0000 mg | ORAL_TABLET | Freq: Four times a day (QID) | ORAL | 0 refills | Status: DC | PRN

## 2018-07-06 MED ORDER — DOCUSATE SODIUM 100 MG PO CAPS: ORAL_CAPSULE | ORAL | 2 refills | Status: DC

## 2018-07-06 MED ORDER — DOCUSATE SODIUM 100 MG PO CAPS: ORAL_CAPSULE | ORAL | 0 refills | Status: DC

## 2018-07-06 NOTE — Progress Notes (Signed)
Subjective:    Patient ID: Rachel [name], female    DOB: July 03, 1945, 73 y.o.   MRN: [id number]  Patient presents for Follow-up (is not fasting); Injections (Prolia); and Back Pain (has constant pain n lower back on L side that radiates down her leg)  Pt here to f/u chronic medical problems   Due for Prolia injection for her osteoporosis.  She is also taking her calcium and vitamin D.   Back pain for past 2 months which as been going down both legs post to her knees. No tingling or numbness  Gets better during the day  Feels like legs are going given  Triped and fell to her elbows, but accident  Taking Aleve times.  Often unable to complete her housework and cook because she is in severe pain with standing.  Denies any change in her bladder function. Did have MRI back in 2009 that showed degenerative disc disease facet arthropathy in slipped disc at L4-L5 Had an initial back injury in 2007 when she fell hanging some curtains.  Acid reflux and constipation.  She was referred to GI at her last visit however she is still confused about her medications.  She was taken off of Protonix and told to take Dexilant along with Pepcid as needed.  She thought the Dexilant was for her constipation.  For her constipation she is supposed to take milk of magnesia along with Colace 100 mg twice a day.  She states that this is not working as well as she want to increase her Colace advised her that she would need some type of laxative if the stool softener was not working.  She then told me that she stopped taking the milk of magnesia as well as she was confused.  Also she did not follow-up with a gastroenterologist in 4 weeks after they recommended that.  Denies any blood in the stool.  She is wanting to have a colonoscopy.   Review Of Systems:  GEN- denies fatigue, fever, weight loss,weakness, recent illness HEENT- denies eye drainage, change in vision, nasal discharge, CVS- denies chest pain,  palpitations RESP- denies SOB, cough, wheeze ABD- denies N/V, change in stools, abd pain GU- denies dysuria, hematuria, dribbling, incontinence MSK- denies joint pain, muscle aches, injury Neuro- denies headache, dizziness, syncope, seizure activity       Objective:    BP 140/90   Pulse 82   Temp 98.1 F (36.7 C) (Oral)   Resp 14   Ht 5' 4.5" (1.638 m)   Wt 153 lb (69.4 kg)   BMI 25.86 kg/m  GEN- NAD, alert and oriented x3 HEENT- PERRL, EOMI, non injected sclera, pink conjunctiva, MMM, oropharynx clear CVS- RRR, no murmur RESP-CTAB ABD-NABS,soft,NT,ND EXT- No edema MSK- TTP lumbar spine, +SLR Left side, fair ROM, no spasm, fair ROM bilat HIPS/KNEES Neuro- normal tone LE, sensation in tact, strength equal bilat  Pulses- Radial, DP- 2+        Assessment & Plan:      Problem List Items Addressed This Visit      Unprioritized   Constipation    Given sample of amitiza 6mcg, take along with stool softener 100mg  BID, I do not see any benefit in giving higher doses of the colace Will send message to GI as well about her constipation see if colonoscopy warranted, she thinks she has cancer      DDD (degenerative disc disease), lumbar    MRI to be done, concern for spinal stenosis that has  developed over time and with her age Tylenol for pain and Ultram for severe pain Avoiding NSAIDS per above symptoms with GERD      Relevant Medications   traMADol (ULTRAM) 50 MG tablet   GERD (gastroesophageal reflux disease) (Chronic)    Discussed dexilant for GERD, continue, okay to add pepcid per GI recommendations      Relevant Medications   dexlansoprazole (DEXILANT) 60 MG capsule   docusate sodium (COLACE) 100 MG capsule   Osteoporosis - Primary   Relevant Medications   denosumab (PROLIA) injection 60 mg (Completed)    Other Visit Diagnoses    Back pain with radiculopathy       Relevant Medications   traMADol (ULTRAM) 50 MG tablet      Note: This dictation was  prepared with Dragon dictation along with smaller phrase technology. Any transcriptional errors that result from this process are unintentional.

## 2018-07-06 NOTE — Assessment & Plan Note (Signed)
Discussed dexilant for GERD, continue, okay to add pepcid per GI recommendations

## 2018-07-06 NOTE — Patient Instructions (Addendum)
MRI of lumbar spine  amitiza try for constipaton Ultram (tramadol) -Back pain  and colace sent to [facility]  F/U 6 MONTHS FOR PHYSICAL

## 2018-07-06 NOTE — Assessment & Plan Note (Signed)
Given sample of amitiza 50mcg, take along with stool softener 100mg  BID, I do not see any benefit in giving higher doses of the colace Will send message to GI as well about her constipation see if colonoscopy warranted, she thinks she has cancer

## 2018-07-08 NOTE — Assessment & Plan Note (Addendum)
MRI to be done, concern for spinal stenosis that has developed over time and with her age Tylenol for pain and Ultram for severe pain Avoiding NSAIDS per above symptoms with GERD

## 2018-07-17 ENCOUNTER — Ambulatory Visit (HOSPITAL_COMMUNITY): Payer: PPO

## 2018-07-19 ENCOUNTER — Ambulatory Visit (HOSPITAL_COMMUNITY)
Admission: RE | Admit: 2018-07-19 | Discharge: 2018-07-19 | Disposition: A | Discharge status: Home / Self Care | Payer: PPO | Source: Ambulatory Visit | Attending: Family Medicine | Admitting: Family Medicine

## 2018-07-19 DIAGNOSIS — M5127 Other intervertebral disc displacement, lumbosacral region: Secondary | ICD-10-CM | POA: Diagnosis not present

## 2018-07-19 DIAGNOSIS — M51369 Other intervertebral disc degeneration, lumbar region without mention of lumbar back pain or lower extremity pain: Secondary | ICD-10-CM

## 2018-07-19 DIAGNOSIS — M5136 Other intervertebral disc degeneration, lumbar region: Secondary | ICD-10-CM | POA: Diagnosis not present

## 2018-07-19 DIAGNOSIS — M810 Age-related osteoporosis without current pathological fracture: Secondary | ICD-10-CM

## 2018-07-19 DIAGNOSIS — M5410 Radiculopathy, site unspecified: Secondary | ICD-10-CM | POA: Diagnosis not present

## 2018-07-19 DIAGNOSIS — M48061 Spinal stenosis, lumbar region without neurogenic claudication: Secondary | ICD-10-CM | POA: Diagnosis not present

## 2018-07-20 ENCOUNTER — Other Ambulatory Visit: Payer: Self-pay | Admitting: *Deleted

## 2018-07-20 DIAGNOSIS — M5410 Radiculopathy, site unspecified: Secondary | ICD-10-CM

## 2018-07-20 DIAGNOSIS — M4800 Spinal stenosis, site unspecified: Secondary | ICD-10-CM

## 2018-07-20 DIAGNOSIS — M5136 Other intervertebral disc degeneration, lumbar region: Secondary | ICD-10-CM

## 2018-07-23 DIAGNOSIS — H2513 Age-related nuclear cataract, bilateral: Secondary | ICD-10-CM | POA: Diagnosis not present

## 2018-07-23 DIAGNOSIS — H40013 Open angle with borderline findings, low risk, bilateral: Secondary | ICD-10-CM | POA: Diagnosis not present

## 2018-07-23 DIAGNOSIS — H25013 Cortical age-related cataract, bilateral: Secondary | ICD-10-CM | POA: Diagnosis not present

## 2018-07-23 DIAGNOSIS — H35363 Drusen (degenerative) of macula, bilateral: Secondary | ICD-10-CM | POA: Diagnosis not present

## 2018-08-09 ENCOUNTER — Ambulatory Visit (INDEPENDENT_AMBULATORY_CARE_PROVIDER_SITE_OTHER): Payer: PPO | Admitting: Orthopaedic Surgery

## 2018-08-09 ENCOUNTER — Ambulatory Visit (INDEPENDENT_AMBULATORY_CARE_PROVIDER_SITE_OTHER): Payer: PPO

## 2018-08-09 ENCOUNTER — Other Ambulatory Visit: Payer: Self-pay

## 2018-08-09 ENCOUNTER — Encounter (INDEPENDENT_AMBULATORY_CARE_PROVIDER_SITE_OTHER): Payer: Self-pay | Admitting: Orthopaedic Surgery

## 2018-08-09 VITALS — BP 143/95 | HR 64 | Ht 64.0 in | Wt 151.0 lb

## 2018-08-09 DIAGNOSIS — M48062 Spinal stenosis, lumbar region with neurogenic claudication: Secondary | ICD-10-CM | POA: Diagnosis not present

## 2018-08-09 DIAGNOSIS — M545 Low back pain, unspecified: Secondary | ICD-10-CM

## 2018-08-09 DIAGNOSIS — G8929 Other chronic pain: Secondary | ICD-10-CM

## 2018-08-09 DIAGNOSIS — M48061 Spinal stenosis, lumbar region without neurogenic claudication: Secondary | ICD-10-CM

## 2018-08-09 NOTE — Progress Notes (Signed)
Office Visit Note   Patient: Rachel [name]           Date of Birth: 1945-09-19           MRN: [id number] Visit Date: 08/09/2018              Requested by: [name], MD [address] [name], [place] [address] PCP: [name], MD   Assessment & Plan: Visit Diagnoses:  1. Chronic bilateral low back pain, unspecified whether sciatica present   2. Spinal stenosis of lumbar region with neurogenic claudication   3. Foraminal stenosis of lumbar region     Plan: Patient has lumbar spinal stenosis with neurogenic claudication symptoms which are progressed with severe pain.  She has legs giving way after standing for 4 to 5 minutes it bothers her on a daily basis.  Reviewed the x-rays that showed instability L4-5 and the MRI scan which shows severe spinal stenosis at that level with bilateral severe neural foraminal stenosis.  Plan would be 1 level decompression with removal of posterior element Gill procedure with lateral fusion interbody fusion with cage and pedicle instrumentation (360 fusion).  Risks of surgery discussed with risks of nonunion revision surgery she does have osteoporosis.  Questions were elicited and answered.  Risks of adjacent level development at some point was reviewed.  Patient does have some facet hypertrophy at L5-S1 without stenosis.  Questions were elicited and answered she understands request we proceed.  Follow-Up Instructions: Preop for Gill procedure single level fusion L4-5 with instrumentation.  Orders:  Orders Placed This Encounter  Procedures  . XR Lumb Spine Flex&Ext Only   No orders of the defined types were placed in this encounter.     Procedures: No procedures performed   Clinical Data: No additional findings.   Subjective: Chief Complaint  Patient presents with  . Lower Back - Pain    HPI 73 year old female is seen with 6-year history of progressive back pain leg pain pain with standing.  Patient states she can only  stand 5 minutes and has to sit down for legs give way.  She can only walk almost 1 block and then has to stop and sit for 5 to 10 minutes and then repeat.  She is taken tramadol and Naprosyn without relief.  She quit smoking 4 years ago and was 1/2 pack/day smoker.  She states when she stands pain goes to 8 up to 10.  She has a problems getting upright in the morning.  She is tried ice heat medications and nothing is help with the leg pain with standing or trying to walk.  She has problems when she cooks because she has to repetitively stop and sit down.  No fever chills no associated bowel or bladder symptoms.  She  is single.  Patient still goes to the store but states she has to lean over the grocery cart in order to get through the store and if no cart is available she has to sit and wait till a cart  is available.  Review of Systems:   Cataracts hypertension.  History of acid reflux cataracts past smoker quit 4 and half years ago.  Positive for GERD vitamin D deficiency.  Alopecia allergic rhinitis.  Osteoporosis being treated with Prolia..  Previous right long trigger finger.  Mild cervical stenosis at C6-7.  14 point review systems otherwise negative is obtains HPI.   Objective: Vital Signs: BP (!) 143/95   Pulse 64  Ht 5\' 4"  (1.626 m)   Wt 151 lb (68.5 kg)   BMI 25.92 kg/m   Physical Exam Constitutional:      Appearance: She is well-developed.  HENT:     Head: Normocephalic.     Right Ear: External ear normal.     Left Ear: External ear normal.  Eyes:     Pupils: Pupils are equal, round, and reactive to light.  Neck:     Thyroid: No thyromegaly.     Trachea: No tracheal deviation.  Cardiovascular:     Rate and Rhythm: Normal rate.  Pulmonary:     Effort: Pulmonary effort is normal.  Abdominal:     Palpations: Abdomen is soft.  Skin:    General: Skin is warm and dry.  Neurological:     Mental Status: She is alert and oriented to person, place, and time.  Psychiatric:         Behavior: Behavior normal.     Ortho Exam patient has negative logroll of the hip she gets from sitting to standing easily she ambulates across exam with no weakness anterior tib gastrocsoleus is strong distal pulses are palpable.  Sensation her foot is intact.  Normal excursion with lumbar forward flexion normal extension.  She has some tenderness palpation over L4-5 level over the facets.  Sensory L4 L5-S1 dermatomes are normal.  Gastrocsoleus is normal she is able to heel and toe walk.  Specialty Comments:  No specialty comments available.  Imaging: CLINICAL DATA:  Low back and bilateral thigh pain for 4 months.  EXAM: MRI LUMBAR SPINE WITHOUT CONTRAST  TECHNIQUE: Multiplanar, multisequence MR imaging of the lumbar spine was performed. No intravenous contrast was administered.  COMPARISON:  CT abdomen and pelvis 05/25/2010  FINDINGS: Segmentation:  Standard.  Alignment: Facet mediated anterolisthesis of L4 on L5 measuring 6 mm.  Vertebrae: No fracture, suspicious osseous lesion, or significant marrow edema.  Conus medullaris and cauda equina: Conus extends to the L1 level. Conus and cauda equina appear normal.  Paraspinal and other soft tissues: Unremarkable.  Disc levels:  Disc desiccation throughout the lumbar and included lower thoracic spine. Mild disc space narrowing at L3-4 and moderate narrowing at L4-5.  L1-2: Mild facet hypertrophy without disc herniation or stenosis.  L2-3: Minimal disc bulging and mild-to-moderate facet hypertrophy without stenosis.  L3-4: Disc bulging, a left foraminal to extraforaminal disc protrusion, moderate facet and ligamentum flavum hypertrophy, and mildly prominent dorsal epidural fat result in moderate spinal stenosis and mild right and moderate left neural foraminal stenosis.  L4-5: Anterolisthesis with bulging uncovered disc and severe facet and ligamentum flavum hypertrophy result in severe spinal stenosis  and severe bilateral neural foraminal stenosis. Potential bilateral L4 nerve root impingement.  L5-S1: Minimal disc bulging and severe right and mild left facet hypertrophy without stenosis.  IMPRESSION: Disc and facet degeneration greatest at L4-5 where there is grade 1 anterolisthesis, severe spinal stenosis, and severe bilateral neural foraminal stenosis.   Electronically Signed   By: Logan Bores M.D.   On: 07/19/2018 15:41    PMFS History: Patient Active Problem List   Diagnosis Date Noted  . DDD (degenerative disc disease), lumbar 07/06/2018  . Osteoporosis 04/24/2018  . Encounter for gynecological examination with Papanicolaou smear of cervix 09/08/2015  . Abnormal Pap smear of vagina 10/15/2014  . Acquired trigger finger 09/03/2014  . Constipation 04/04/2014  . VAIN (vaginal intraepithelial neoplasia) 06/20/2013  . Knee pain, right 06/14/2013  . Allergic rhinitis 08/22/2012  . Neuropathy, arm  02/19/2012  . GERD (gastroesophageal reflux disease) 02/19/2012  . Alopecia 02/19/2012  . Anxiety 10/03/2011  . Tremor 10/03/2011  . Tobacco use 10/03/2011  . OA (osteoarthritis) 08/11/2011  . Cancer ([place]) 08/11/2011  . Essential hypertension, benign 08/09/2011  . Vitamin D deficiency 08/09/2011   Past Medical History:  Diagnosis Date  . Back pain   . Carpal tunnel syndrome   . Constipation   . Hypertension   . Low grade squamous intraepith lesion on cytologic smear cervix (lgsil)    +hpv, HAD HYSTERECTOMY  . OA (osteoarthritis)     Family History  Problem Relation Age of Onset  . Diabetes Father   . Cancer Father        prostate cancer  . Cancer Sister        breast  . Hypertension Son   . Cancer Maternal Grandmother   . Eczema Daughter   . Other Daughter        stomach issues    Past Surgical History:  Procedure Laterality Date  . ABDOMINAL HYSTERECTOMY  2006  . BREAST CYST EXCISION     right  . BUNIONECTOMY     left  . CARPAL TUNNEL RELEASE  Right   . ESOPHAGOGASTRODUODENOSCOPY  06/13/2012   Procedure: ESOPHAGOGASTRODUODENOSCOPY (EGD);  Surgeon: Rogene Houston, MD;  Location[facility];  Service: Endoscopy;  Laterality: N/A;  200  . TRIGGER FINGER RELEASE Right 08/25/2016   Procedure: RELEASE TRIGGER FINGER/A-1 PULLEY RIGHT LONG TRIGGER FINGER RELEASE;  Surgeon: Carole Civil, MD;  Location: [facility];  Service: Orthopedics;  Laterality: Right;   Social History   Occupational History  . Not on file  Tobacco Use  . Smoking status: Former Smoker    Packs/day: 0.25    Years: 50.00    Pack years: 12.50    Types: Cigarettes    Last attempt to quit: 07/28/2013    Years since quitting: 5.0  . Smokeless tobacco: Never Used  Substance and Sexual Activity  . Alcohol use: Yes    Alcohol/week: 0.0 standard drinks    Comment: occ  . Drug use: No  . Sexual activity: Not Currently    Birth control/protection: Surgical    Comment: hyst

## 2018-08-10 ENCOUNTER — Encounter (INDEPENDENT_AMBULATORY_CARE_PROVIDER_SITE_OTHER): Payer: Self-pay | Admitting: Orthopaedic Surgery

## 2018-08-10 DIAGNOSIS — M48061 Spinal stenosis, lumbar region without neurogenic claudication: Secondary | ICD-10-CM | POA: Insufficient documentation

## 2018-08-10 DIAGNOSIS — M48062 Spinal stenosis, lumbar region with neurogenic claudication: Secondary | ICD-10-CM | POA: Insufficient documentation

## 2018-08-15 ENCOUNTER — Encounter: Payer: Self-pay | Admitting: Family Medicine

## 2018-09-03 ENCOUNTER — Telehealth: Payer: Self-pay | Admitting: Family Medicine

## 2018-09-03 DIAGNOSIS — I10 Essential (primary) hypertension: Secondary | ICD-10-CM

## 2018-09-03 DIAGNOSIS — K5900 Constipation, unspecified: Secondary | ICD-10-CM

## 2018-09-03 DIAGNOSIS — R251 Tremor, unspecified: Secondary | ICD-10-CM

## 2018-09-03 DIAGNOSIS — Z23 Encounter for immunization: Secondary | ICD-10-CM

## 2018-09-03 DIAGNOSIS — K219 Gastro-esophageal reflux disease without esophagitis: Secondary | ICD-10-CM

## 2018-09-03 MED ORDER — POLYETHYLENE GLYCOL 3350 17 GM/SCOOP PO POWD: 17.0000 g | Freq: Two times a day (BID) | ORAL | 1 refills | Status: DC | PRN

## 2018-09-03 MED ORDER — PROPRANOLOL HCL 20 MG PO TABS: 20.0000 mg | ORAL_TABLET | Freq: Two times a day (BID) | ORAL | 0 refills | Status: DC

## 2018-09-03 MED ORDER — PANTOPRAZOLE SODIUM 40 MG PO TBEC: 40.0000 mg | DELAYED_RELEASE_TABLET | Freq: Every day | ORAL | 3 refills | Status: DC

## 2018-09-03 NOTE — Telephone Encounter (Signed)
Pt needs refill on propranolol [facility].   Pt needs you to call her back asap about other medications that are not listed on her med list.

## 2018-09-03 NOTE — Telephone Encounter (Signed)
Patient requested to resume Protonix as it is more effective than Dexilant.   Also requested refill on Miralax.   Prescriptions sent to pharmacy.

## 2018-09-05 ENCOUNTER — Other Ambulatory Visit: Payer: Self-pay | Admitting: *Deleted

## 2018-09-05 MED ORDER — PANTOPRAZOLE SODIUM 40 MG PO TBEC: 40.0000 mg | DELAYED_RELEASE_TABLET | Freq: Every day | ORAL | 3 refills | Status: DC

## 2018-11-03 ENCOUNTER — Other Ambulatory Visit: Payer: Self-pay | Admitting: Family Medicine

## 2018-11-28 ENCOUNTER — Ambulatory Visit (INDEPENDENT_AMBULATORY_CARE_PROVIDER_SITE_OTHER): Payer: PPO | Admitting: Internal Medicine

## 2018-12-03 DIAGNOSIS — H25013 Cortical age-related cataract, bilateral: Secondary | ICD-10-CM | POA: Diagnosis not present

## 2018-12-03 DIAGNOSIS — H2513 Age-related nuclear cataract, bilateral: Secondary | ICD-10-CM | POA: Diagnosis not present

## 2018-12-03 DIAGNOSIS — H2511 Age-related nuclear cataract, right eye: Secondary | ICD-10-CM | POA: Diagnosis not present

## 2018-12-03 DIAGNOSIS — H40013 Open angle with borderline findings, low risk, bilateral: Secondary | ICD-10-CM | POA: Diagnosis not present

## 2018-12-04 ENCOUNTER — Ambulatory Visit (INDEPENDENT_AMBULATORY_CARE_PROVIDER_SITE_OTHER): Payer: PPO | Admitting: Internal Medicine

## 2018-12-06 ENCOUNTER — Ambulatory Visit (INDEPENDENT_AMBULATORY_CARE_PROVIDER_SITE_OTHER): Payer: Medicare Other | Admitting: Internal Medicine

## 2018-12-13 DIAGNOSIS — M79674 Pain in right toe(s): Secondary | ICD-10-CM | POA: Diagnosis not present

## 2018-12-13 DIAGNOSIS — M79671 Pain in right foot: Secondary | ICD-10-CM | POA: Diagnosis not present

## 2018-12-13 DIAGNOSIS — L600 Ingrowing nail: Secondary | ICD-10-CM | POA: Diagnosis not present

## 2018-12-25 DIAGNOSIS — H2511 Age-related nuclear cataract, right eye: Secondary | ICD-10-CM | POA: Diagnosis not present

## 2018-12-25 DIAGNOSIS — H25811 Combined forms of age-related cataract, right eye: Secondary | ICD-10-CM | POA: Diagnosis not present

## 2019-01-09 DIAGNOSIS — H25012 Cortical age-related cataract, left eye: Secondary | ICD-10-CM | POA: Diagnosis not present

## 2019-01-09 DIAGNOSIS — H2512 Age-related nuclear cataract, left eye: Secondary | ICD-10-CM | POA: Diagnosis not present

## 2019-01-10 ENCOUNTER — Ambulatory Visit: Payer: PPO

## 2019-01-11 ENCOUNTER — Telehealth: Payer: Self-pay | Admitting: Orthopaedic Surgery

## 2019-01-11 NOTE — Telephone Encounter (Signed)
Patient called about scheduling her surgery. Surgery sheet was complete in March and clearance received from PCP.  She initially wanted to schedule something in early or mid October, however after explaining that Dr. [name] would not be scheduling cases during this time, patient has requested surgery in September.  She does not want to be in the hospital on her birthday, so she has selected [birth year].  Patient will call [place] office for an appointment this Thursday to see Dr. [name] to discuss the surgery and is requesting that her daughter be present at the appointment.  Patient feels that the symptoms have gotten worse.  A referral was sent to Amy in insurance in order to begin the authorization process for the procedure, including two night stay in the hospital (TBA).

## 2019-01-14 NOTE — Telephone Encounter (Signed)
fyi

## 2019-01-15 DIAGNOSIS — L089 Local infection of the skin and subcutaneous tissue, unspecified: Secondary | ICD-10-CM | POA: Diagnosis not present

## 2019-01-15 DIAGNOSIS — S80861A Insect bite (nonvenomous), right lower leg, initial encounter: Secondary | ICD-10-CM | POA: Diagnosis not present

## 2019-01-15 DIAGNOSIS — S40861A Insect bite (nonvenomous) of right upper arm, initial encounter: Secondary | ICD-10-CM | POA: Diagnosis not present

## 2019-01-15 DIAGNOSIS — S80862A Insect bite (nonvenomous), left lower leg, initial encounter: Secondary | ICD-10-CM | POA: Diagnosis not present

## 2019-01-15 DIAGNOSIS — S40862A Insect bite (nonvenomous) of left upper arm, initial encounter: Secondary | ICD-10-CM | POA: Diagnosis not present

## 2019-01-15 DIAGNOSIS — W57XXXA Bitten or stung by nonvenomous insect and other nonvenomous arthropods, initial encounter: Secondary | ICD-10-CM | POA: Diagnosis not present

## 2019-01-15 DIAGNOSIS — S1096XA Insect bite of unspecified part of neck, initial encounter: Secondary | ICD-10-CM | POA: Diagnosis not present

## 2019-01-16 ENCOUNTER — Ambulatory Visit: Payer: Medicare Other

## 2019-01-22 DIAGNOSIS — H25012 Cortical age-related cataract, left eye: Secondary | ICD-10-CM | POA: Diagnosis not present

## 2019-01-22 DIAGNOSIS — H2512 Age-related nuclear cataract, left eye: Secondary | ICD-10-CM | POA: Diagnosis not present

## 2019-01-22 DIAGNOSIS — H25812 Combined forms of age-related cataract, left eye: Secondary | ICD-10-CM | POA: Diagnosis not present

## 2019-01-23 ENCOUNTER — Other Ambulatory Visit: Payer: Self-pay

## 2019-01-23 ENCOUNTER — Ambulatory Visit (INDEPENDENT_AMBULATORY_CARE_PROVIDER_SITE_OTHER): Payer: Medicare Other | Admitting: Family Medicine

## 2019-01-23 DIAGNOSIS — M810 Age-related osteoporosis without current pathological fracture: Secondary | ICD-10-CM

## 2019-01-23 MED ORDER — DENOSUMAB 60 MG/ML ~~LOC~~ SOSY
60.0000 mg | PREFILLED_SYRINGE | Freq: Once | SUBCUTANEOUS | Status: AC
  Administered 2019-01-23: 60 mg via SUBCUTANEOUS

## 2019-01-29 ENCOUNTER — Other Ambulatory Visit: Payer: Self-pay

## 2019-01-31 ENCOUNTER — Other Ambulatory Visit: Payer: Self-pay

## 2019-01-31 ENCOUNTER — Ambulatory Visit (INDEPENDENT_AMBULATORY_CARE_PROVIDER_SITE_OTHER): Payer: Medicare Other | Admitting: Orthopaedic Surgery

## 2019-01-31 VITALS — BP 229/144 | HR 75 | Ht 64.0 in | Wt 151.0 lb

## 2019-01-31 DIAGNOSIS — M48062 Spinal stenosis, lumbar region with neurogenic claudication: Secondary | ICD-10-CM

## 2019-02-05 NOTE — Progress Notes (Signed)
Office Visit Note   Patient: Rachel [name]           Date of Birth: 1946-01-01           MRN: [id number] Visit Date: 01/31/2019              Requested by: [name], MD [address] [place],  [place] [address] PCP: [name], MD   Assessment & Plan: Visit Diagnoses:  1. Spinal stenosis of lumbar region with neurogenic claudication     Plan: Patient will require single level decompression and instrumented fusion for severe spinal stenosis.  We discussed risks of dural tear pseudoarthrosis repeat surgery possibility of cage migration.  Screw malpositioning and possibility of repeat surgery discussed.  Use of postoperative brace, walker and family assistance in the postoperative time.  For several days to help with activities of daily living.  She will check with her PCP about her blood pressure again.  Multiple questions were elicited and answered with her daughters present.  All questions were answered.  Patient understands and agrees to proceed.  Follow-Up Instructions: No follow-ups on file.   Orders:  No orders of the defined types were placed in this encounter.  No orders of the defined types were placed in this encounter.     Procedures: No procedures performed   Clinical Data: No additional findings.   Subjective: Chief Complaint  Patient presents with  . Lower Back - Pain    HPI 73 year old female returns for follow-up with lumbar spinal stenosis at L4-5 rated as severe with anterolisthesis and facet arthropathy with severe bilateral neuroforaminal stenosis.  She has neurogenic claudication symptoms and can only stand 5 minutes and can walk almost 1 block.  She is here with her 2 daughters and 1 to rediscussed plan surgical intervention.  She has problems when she first gets up in the morning.  She denies fever chills no associated bowel or bladder symptoms.  She has to lean over a grocery cart if she goes to the store but with increased symptoms  she has been avoiding this.  Review of Systems positive for white coat syndrome BP 229/144 and she states other times it is 130/80.  Positive for history of cataracts, hypertension, acid reflux.  Past smoker quit 4 and half years ago.  Positive for vitamin D deficiency GERD.  Positive for alopecia allergic rhinitis.  She is on Prolia for osteoporosis.  Mild cervical stenosis C6-7.  Negative for chest pain stroke, renal problems.  14 point review of systems otherwise negative.   Objective: Vital Signs: BP (!) 229/144   Pulse 75   Ht 5\' 4"  (1.626 m)   Wt 151 lb (68.5 kg)   BMI 25.92 kg/m  Repeat BP was 189/117 10 minutes later. Physical Exam Constitutional:      Appearance: She is well-developed.  HENT:     Head: Normocephalic.     Right Ear: External ear normal.     Left Ear: External ear normal.  Eyes:     Pupils: Pupils are equal, round, and reactive to light.  Neck:     Thyroid: No thyromegaly.     Trachea: No tracheal deviation.  Cardiovascular:     Rate and Rhythm: Normal rate.  Pulmonary:     Effort: Pulmonary effort is normal.  Abdominal:     Palpations: Abdomen is soft.  Skin:    General: Skin is warm and dry.  Neurological:     Mental  Status: She is alert and oriented to person, place, and time.  Psychiatric:        Behavior: Behavior normal.     Ortho Exam negative logroll's both hips.  She can get from sitting to standing.  Gastrocsoleus anterior tib are intact.  She has some sciatic notch tenderness both right and left.  Distal pulses are 2+.  Peroneals posterior tib are normal to testing.  Specialty Comments:  No specialty comments available.  Imaging: CLINICAL DATA:  Low back and bilateral thigh pain for 4 months.  EXAM: MRI LUMBAR SPINE WITHOUT CONTRAST  TECHNIQUE: Multiplanar, multisequence MR imaging of the lumbar spine was performed. No intravenous contrast was administered.  COMPARISON:  CT abdomen and pelvis 05/25/2010  FINDINGS:  Segmentation:  Standard.  Alignment: Facet mediated anterolisthesis of L4 on L5 measuring 6 mm.  Vertebrae: No fracture, suspicious osseous lesion, or significant marrow edema.  Conus medullaris and cauda equina: Conus extends to the L1 level. Conus and cauda equina appear normal.  Paraspinal and other soft tissues: Unremarkable.  Disc levels:  Disc desiccation throughout the lumbar and included lower thoracic spine. Mild disc space narrowing at L3-4 and moderate narrowing at L4-5.  L1-2: Mild facet hypertrophy without disc herniation or stenosis.  L2-3: Minimal disc bulging and mild-to-moderate facet hypertrophy without stenosis.  L3-4: Disc bulging, a left foraminal to extraforaminal disc protrusion, moderate facet and ligamentum flavum hypertrophy, and mildly prominent dorsal epidural fat result in moderate spinal stenosis and mild right and moderate left neural foraminal stenosis.  L4-5: Anterolisthesis with bulging uncovered disc and severe facet and ligamentum flavum hypertrophy result in severe spinal stenosis and severe bilateral neural foraminal stenosis. Potential bilateral L4 nerve root impingement.  L5-S1: Minimal disc bulging and severe right and mild left facet hypertrophy without stenosis.  IMPRESSION: Disc and facet degeneration greatest at L4-5 where there is grade 1 anterolisthesis, severe spinal stenosis, and severe bilateral neural foraminal stenosis.   Electronically Signed   By: Logan Bores M.D.   On: 07/19/2018 15:41    PMFS History: Patient Active Problem List   Diagnosis Date Noted  . Spinal stenosis of lumbar region with neurogenic claudication 08/10/2018  . Foraminal stenosis of lumbar region 08/10/2018  . DDD (degenerative disc disease), lumbar 07/06/2018  . Osteoporosis 04/24/2018  . Encounter for gynecological examination with Papanicolaou smear of cervix 09/08/2015  . Abnormal Pap smear of vagina 10/15/2014  .  Acquired trigger finger 09/03/2014  . Constipation 04/04/2014  . VAIN (vaginal intraepithelial neoplasia) 06/20/2013  . Knee pain, right 06/14/2013  . Allergic rhinitis 08/22/2012  . Neuropathy, arm 02/19/2012  . GERD (gastroesophageal reflux disease) 02/19/2012  . Alopecia 02/19/2012  . Anxiety 10/03/2011  . Tremor 10/03/2011  . Tobacco use 10/03/2011  . OA (osteoarthritis) 08/11/2011  . Cancer ([place]) 08/11/2011  . Essential hypertension, benign 08/09/2011  . Vitamin D deficiency 08/09/2011   Past Medical History:  Diagnosis Date  . Back pain   . Carpal tunnel syndrome   . Constipation   . Hypertension   . Low grade squamous intraepith lesion on cytologic smear cervix (lgsil)    +hpv, HAD HYSTERECTOMY  . OA (osteoarthritis)     Family History  Problem Relation Age of Onset  . Diabetes Father   . Cancer Father        prostate cancer  . Cancer Sister        breast  . Hypertension Son   . Cancer Maternal Grandmother   . Eczema Daughter   .  Other Daughter        stomach issues    Past Surgical History:  Procedure Laterality Date  . ABDOMINAL HYSTERECTOMY  2006  . BREAST CYST EXCISION     right  . BUNIONECTOMY     left  . CARPAL TUNNEL RELEASE Right   . ESOPHAGOGASTRODUODENOSCOPY  06/13/2012   Procedure: ESOPHAGOGASTRODUODENOSCOPY (EGD);  Surgeon: Rogene Houston, MD;  Location: [facility];  Service: Endoscopy;  Laterality: N/A;  200  . TRIGGER FINGER RELEASE Right 08/25/2016   Procedure: RELEASE TRIGGER FINGER/A-1 PULLEY RIGHT LONG TRIGGER FINGER RELEASE;  Surgeon: Carole Civil, MD;  Location: [facility];  Service: Orthopedics;  Laterality: Right;   Social History   Occupational History  . Not on file  Tobacco Use  . Smoking status: Former Smoker    Packs/day: 0.25    Years: 50.00    Pack years: 12.50    Types: Cigarettes    Quit date: 07/28/2013    Years since quitting: 5.5  . Smokeless tobacco: Never Used  Substance and Sexual Activity  . Alcohol use:  Yes    Alcohol/week: 0.0 standard drinks    Comment: occ  . Drug use: No  . Sexual activity: Not Currently    Birth control/protection: Surgical    Comment: hyst

## 2019-02-06 ENCOUNTER — Telehealth: Payer: Self-pay | Admitting: Orthopaedic Surgery

## 2019-02-06 ENCOUNTER — Telehealth: Payer: Self-pay | Admitting: Family Medicine

## 2019-02-06 NOTE — Telephone Encounter (Signed)
Call placed to patient. No answer. No VM.  

## 2019-02-06 NOTE — Telephone Encounter (Signed)
Call pt her BP was severely elevated at orthopedics, they just sent me the note  What is her BP running at home? If elevated  > 140/90 needs to be seen before surgery

## 2019-02-07 NOTE — Telephone Encounter (Signed)
Also received lab results from [facility] for stool sample for iFOBT.  Noted that specimen was too old to run. Patient will need to collect another stool ca rd.

## 2019-02-07 NOTE — Telephone Encounter (Signed)
Call placed to patient. No answer. No VM.  

## 2019-02-08 ENCOUNTER — Other Ambulatory Visit: Payer: Self-pay | Admitting: *Deleted

## 2019-02-08 DIAGNOSIS — Z1211 Encounter for screening for malignant neoplasm of colon: Secondary | ICD-10-CM

## 2019-02-08 DIAGNOSIS — K5900 Constipation, unspecified: Secondary | ICD-10-CM

## 2019-02-08 DIAGNOSIS — Z23 Encounter for immunization: Secondary | ICD-10-CM

## 2019-02-08 DIAGNOSIS — I10 Essential (primary) hypertension: Secondary | ICD-10-CM

## 2019-02-08 DIAGNOSIS — R251 Tremor, unspecified: Secondary | ICD-10-CM

## 2019-02-08 DIAGNOSIS — K219 Gastro-esophageal reflux disease without esophagitis: Secondary | ICD-10-CM

## 2019-02-08 MED ORDER — PROPRANOLOL HCL 20 MG PO TABS: 20.0000 mg | ORAL_TABLET | Freq: Two times a day (BID) | ORAL | 0 refills | Status: DC

## 2019-02-08 NOTE — Telephone Encounter (Signed)
Call placed to patient and patient made aware.   States that BP readings at home range between 130- 140/ 80-90. States that ortho took her BP when she just got into office out of heat and she was in a rush to get there.  Patient will come in on Monday to pick up stool card.

## 2019-02-08 NOTE — Telephone Encounter (Signed)
noted 

## 2019-02-10 NOTE — Progress Notes (Signed)
[facility] [phone] - [place], [place] [address] [place] [address] Phone: [phone] Fax: [phone]      Your procedure is scheduled on February 13, 2019.  Report to [facility] Main Entrance "A" at 10:30 A.M., and check in at the Admitting office.  Call this number if you have problems the morning of surgery:  [phone]  Call [phone] if you have any questions prior to your surgery date Monday-Friday 8am-4pm    Remember:  Do not eat after midnight the night before your surgery  You may drink clear liquids until 9:30 the morning of your surgery.   Clear liquids allowed are: Water, Non-Citrus Juices (without pulp), Carbonated Beverages, Clear Tea, Black Coffee Only, and Gatorade   Enhanced Recovery after Surgery for Orthopedics Enhanced Recovery after Surgery is a protocol used to improve the stress on your body and your recovery after surgery.  Patient Instructions  . The night before surgery:  o No food after midnight. ONLY clear liquids after midnight  .  Marland Kitchen The day of surgery (if you do NOT have diabetes):  o Drink ONE (1) Pre-Surgery Clear Ensure as directed.   o This drink was given to you during your hospital  pre-op appointment visit. o The pre-op nurse will instruct you on the time to drink the  Pre-Surgery Ensure depending on your surgery time.  Finish the drink at the designated time by the pre-op nurse. Finish your Ensure drink by 9:30am the morning of surgery o Nothing else to drink after completing the  Pre-Surgery Clear Ensure.    Take these medicines the morning of surgery with A SIP OF WATER : Propranolol (Inderal) Pantoprazole (Protonix) Eye drops  7 days prior to surgery STOP taking any Aspirin (unless otherwise instructed by your surgeon), Aleve, Naproxen, Ibuprofen, Motrin, Advil, Goody's, BC's, all herbal medications, fish oil, and all vitamins.    The Morning of  Surgery  Do not wear jewelry, make-up or nail polish.  Do not wear lotions, powders, or perfumes/colognes, or deodorant  Do not shave 48 hours prior to surgery.   Do not bring valuables to the hospital.  [facility] is not responsible for any belongings or valuables.  If you are a smoker, DO NOT Smoke 24 hours prior to surgery IF you wear a CPAP at night please bring your mask, tubing, and machine the morning of surgery   Remember that you must have someone to transport you home after your surgery, and remain with you for 24 hours if you are discharged the same day.  Contacts, glasses, hearing aids, dentures or bridgework may not be worn into surgery.   Leave your suitcase in the car.  After surgery it may be brought to your room.  For patients admitted to the hospital, discharge time will be determined by your treatment team.  Patients discharged the day of surgery will not be allowed to drive home.    Special instructions:   - Preparing For Surgery  Before surgery, you can play an important role. Because skin is not sterile, your skin needs to be as free of germs as possible. You can reduce the number of germs on your skin by washing with CHG (chlorahexidine gluconate) Soap before surgery.  CHG is an antiseptic cleaner which kills germs and bonds with the skin to continue killing germs even after washing.    Oral Hygiene  is also important to reduce your risk of infection.  Remember - BRUSH YOUR TEETH THE MORNING OF SURGERY WITH YOUR REGULAR TOOTHPASTE  Please do not use if you have an allergy to CHG or antibacterial soaps. If your skin becomes reddened/irritated stop using the CHG.  Do not shave (including legs and underarms) for at least 48 hours prior to first CHG shower. It is OK to shave your face.  Please follow these instructions carefully.   1. Shower the NIGHT BEFORE SURGERY and the MORNING OF SURGERY with CHG Soap.   2. If you chose to wash your hair, wash  your hair first as usual with your normal shampoo.  3. After you shampoo, rinse your hair and body thoroughly to remove the shampoo.  4. Use CHG as you would any other liquid soap. You can apply CHG directly to the skin and wash gently with a scrungie or a clean washcloth.   5. Apply the CHG Soap to your body ONLY FROM THE NECK DOWN.  Do not use on open wounds or open sores. Avoid contact with your eyes, ears, mouth and genitals (private parts). Wash Face and genitals (private parts)  with your normal soap.   6. Wash thoroughly, paying special attention to the area where your surgery will be performed.  7. Thoroughly rinse your body with warm water from the neck down.  8. DO NOT shower/wash with your normal soap after using and rinsing off the CHG Soap.  9. Pat yourself dry with a CLEAN TOWEL.  10. Wear CLEAN PAJAMAS to bed the night before surgery, wear comfortable clothes the morning of surgery  11. Place CLEAN SHEETS on your bed the night of your first shower and DO NOT SLEEP WITH PETS.    Day of Surgery:  Do not apply any deodorants/lotions. Please shower the morning of surgery with the CHG soap  Please wear clean clothes to the hospital/surgery center.   Remember to brush your teeth WITH YOUR REGULAR TOOTHPASTE.   Please read over the following fact sheets that you were given.

## 2019-02-11 ENCOUNTER — Ambulatory Visit (HOSPITAL_COMMUNITY)
Admission: RE | Admit: 2019-02-11 | Discharge: 2019-02-11 | Disposition: A | Discharge status: Home / Self Care | Payer: Medicare Other | Source: Ambulatory Visit | Attending: Orthopaedic Surgery | Admitting: Orthopaedic Surgery

## 2019-02-11 ENCOUNTER — Encounter (HOSPITAL_COMMUNITY): Payer: Self-pay

## 2019-02-11 ENCOUNTER — Encounter (HOSPITAL_COMMUNITY)
Admission: RE | Admit: 2019-02-11 | Discharge: 2019-02-11 | Disposition: A | Discharge status: Home / Self Care | Payer: Medicare Other | Source: Ambulatory Visit | Attending: Orthopaedic Surgery | Admitting: Orthopaedic Surgery

## 2019-02-11 ENCOUNTER — Other Ambulatory Visit (HOSPITAL_COMMUNITY)
Admission: RE | Admit: 2019-02-11 | Discharge: 2019-02-11 | Disposition: A | Discharge status: Home / Self Care | Payer: Medicare Other | Source: Ambulatory Visit | Attending: Orthopaedic Surgery | Admitting: Orthopaedic Surgery

## 2019-02-11 ENCOUNTER — Other Ambulatory Visit: Payer: Self-pay

## 2019-02-11 DIAGNOSIS — Z87891 Personal history of nicotine dependence: Secondary | ICD-10-CM | POA: Insufficient documentation

## 2019-02-11 DIAGNOSIS — R9431 Abnormal electrocardiogram [ECG] [EKG]: Secondary | ICD-10-CM | POA: Insufficient documentation

## 2019-02-11 DIAGNOSIS — K219 Gastro-esophageal reflux disease without esophagitis: Secondary | ICD-10-CM | POA: Diagnosis not present

## 2019-02-11 DIAGNOSIS — Z01818 Encounter for other preprocedural examination: Secondary | ICD-10-CM | POA: Insufficient documentation

## 2019-02-11 DIAGNOSIS — M48062 Spinal stenosis, lumbar region with neurogenic claudication: Secondary | ICD-10-CM | POA: Diagnosis not present

## 2019-02-11 DIAGNOSIS — Z20828 Contact with and (suspected) exposure to other viral communicable diseases: Secondary | ICD-10-CM | POA: Insufficient documentation

## 2019-02-11 DIAGNOSIS — M810 Age-related osteoporosis without current pathological fracture: Secondary | ICD-10-CM | POA: Diagnosis not present

## 2019-02-11 DIAGNOSIS — Z79899 Other long term (current) drug therapy: Secondary | ICD-10-CM | POA: Insufficient documentation

## 2019-02-11 DIAGNOSIS — Z22321 Carrier or suspected carrier of Methicillin susceptible Staphylococcus aureus: Secondary | ICD-10-CM | POA: Diagnosis not present

## 2019-02-11 DIAGNOSIS — I10 Essential (primary) hypertension: Secondary | ICD-10-CM | POA: Insufficient documentation

## 2019-02-11 DIAGNOSIS — Z90710 Acquired absence of both cervix and uterus: Secondary | ICD-10-CM | POA: Insufficient documentation

## 2019-02-11 DIAGNOSIS — L659 Nonscarring hair loss, unspecified: Secondary | ICD-10-CM | POA: Diagnosis not present

## 2019-02-11 DIAGNOSIS — E559 Vitamin D deficiency, unspecified: Secondary | ICD-10-CM | POA: Diagnosis not present

## 2019-02-11 DIAGNOSIS — M48061 Spinal stenosis, lumbar region without neurogenic claudication: Secondary | ICD-10-CM | POA: Insufficient documentation

## 2019-02-11 DIAGNOSIS — J309 Allergic rhinitis, unspecified: Secondary | ICD-10-CM | POA: Diagnosis not present

## 2019-02-11 LAB — COMPREHENSIVE METABOLIC PANEL
ALT: 25 U/L (ref 0–44)
AST: 24 U/L (ref 15–41)
Albumin: 3.9 g/dL (ref 3.5–5.0)
Alkaline Phosphatase: 44 U/L (ref 38–126)
Anion gap: 11 (ref 5–15)
BUN: 11 mg/dL (ref 8–23)
CO2: 23 mmol/L (ref 22–32)
Calcium: 10.1 mg/dL (ref 8.9–10.3)
Chloride: 106 mmol/L (ref 98–111)
Creatinine, Ser: 1.03 mg/dL — ABNORMAL HIGH (ref 0.44–1.00)
GFR calc Af Amer: >60 mL/min (ref >60)
GFR calc non Af Amer: 54 mL/min — ABNORMAL LOW (ref >60)
Glucose, Bld: 100 mg/dL — ABNORMAL HIGH (ref 70–99)
Potassium: 3.6 mmol/L (ref 3.5–5.1)
Sodium: 140 mmol/L (ref 135–145)
Total Bilirubin: 0.9 mg/dL (ref 0.3–1.2)
Total Protein: 7.2 g/dL (ref 6.5–8.1)

## 2019-02-11 LAB — CBC
HCT: 41.6 % (ref 36.0–46.0)
Hemoglobin: 13.7 g/dL (ref 12.0–15.0)
MCH: 29.5 pg (ref 26.0–34.0)
MCHC: 32.9 g/dL (ref 30.0–36.0)
MCV: 89.7 fL (ref 80.0–100.0)
Platelets: 193 10*3/uL (ref 150–400)
RBC: 4.64 MIL/uL (ref 3.87–5.11)
RDW: 14.8 % (ref 11.5–15.5)
WBC: 5.6 10*3/uL (ref 4.0–10.5)
nRBC: 0 % (ref 0.0–0.2)

## 2019-02-11 LAB — SURGICAL PCR SCREEN
MRSA, PCR: NEGATIVE
Staphylococcus aureus: POSITIVE — AB

## 2019-02-11 LAB — TYPE AND SCREEN
ABO/RH(D): A POS
Antibody Screen: NEGATIVE

## 2019-02-11 LAB — ABO/RH: ABO/RH(D): A POS

## 2019-02-11 NOTE — Progress Notes (Signed)
PCP -  Dr. Vic Blackbird Cardiologist - denies  Chest x-ray - N/A EKG - 02/11/19 Stress Test - denies ECHO - denies Cardiac Cath - denies  Sleep Study - denies  Aspirin Instructions: Patient instructed to hold all Aspirin, NSAID's, herbal medications, fish oil and vitamins 7 days prior to surgery.   Anesthesia review: review EKG  Patient denies shortness of breath, fever, cough and chest pain at PAT appointment   Patient verbalized understanding of instructions that were given to them at the PAT appointment. Patient was also instructed that they will need to review over the PAT instructions again at home before surgery.

## 2019-02-12 ENCOUNTER — Other Ambulatory Visit: Payer: Self-pay

## 2019-02-12 ENCOUNTER — Telehealth: Payer: Self-pay | Admitting: *Deleted

## 2019-02-12 DIAGNOSIS — K219 Gastro-esophageal reflux disease without esophagitis: Secondary | ICD-10-CM

## 2019-02-12 DIAGNOSIS — R251 Tremor, unspecified: Secondary | ICD-10-CM

## 2019-02-12 DIAGNOSIS — K5900 Constipation, unspecified: Secondary | ICD-10-CM

## 2019-02-12 DIAGNOSIS — I10 Essential (primary) hypertension: Secondary | ICD-10-CM

## 2019-02-12 DIAGNOSIS — Z23 Encounter for immunization: Secondary | ICD-10-CM

## 2019-02-12 LAB — SARS CORONAVIRUS 2 (TAT 6-24 HRS): SARS Coronavirus 2: NEGATIVE

## 2019-02-12 MED ORDER — PROPRANOLOL HCL 20 MG PO TABS: 20.0000 mg | ORAL_TABLET | Freq: Two times a day (BID) | ORAL | 0 refills | Status: DC

## 2019-02-12 NOTE — H&P (Signed)
Office Visit Note              Patient: Rachel [name]                                          Date of Birth: Feb 26, 1946                                                    MRN: [id number] Visit Date: 01/31/2019                                                                     Requested by: [name], MD [address] [place],  [place] [address] PCP: [name], MD   Assessment & Plan: Visit Diagnoses:  1. Spinal stenosis of lumbar region with neurogenic claudication     Plan: Patient will require single level decompression and instrumented fusion for severe spinal stenosis.  We discussed risks of dural tear pseudoarthrosis repeat surgery possibility of cage migration.  Screw malpositioning and possibility of repeat surgery discussed.  Use of postoperative brace, walker and family assistance in the postoperative time.  For several days to help with activities of daily living.  She will check with her PCP about her blood pressure again.  Multiple questions were elicited and answered with her daughters present.  All questions were answered.  Patient understands and agrees to proceed.  Follow-Up Instructions: No follow-ups on file.   Orders:  No orders of the defined types were placed in this encounter.  No orders of the defined types were placed in this encounter.     Procedures: No procedures performed   Clinical Data: No additional findings.   Subjective:    Chief Complaint  Patient presents with  . Lower Back - Pain    HPI 73 year old female returns for follow-up with lumbar spinal stenosis at L4-5 rated as severe with anterolisthesis and facet arthropathy with severe bilateral neuroforaminal stenosis.  She has neurogenic claudication symptoms and can only stand 5 minutes and can walk almost 1 block.  She is here with her 2 daughters and 1 to rediscussed plan surgical intervention.  She has problems when she first gets up in the morning.  She  denies fever chills no associated bowel or bladder symptoms.  She has to lean over a grocery cart if she goes to the store but with increased symptoms she has been avoiding this.  Review of Systems positive for white coat syndrome BP 229/144 and she states other times it is 130/80.  Positive for history of cataracts, hypertension, acid reflux.  Past smoker quit 4 and half years ago.  Positive for vitamin D deficiency GERD.  Positive for alopecia allergic rhinitis.  She is on Prolia for osteoporosis.  Mild cervical stenosis C6-7.  Negative for chest pain stroke, renal problems.  14 point review of systems otherwise negative.   Objective: Vital Signs: BP (!) 229/144   Pulse 75   Ht 5\' 4"  (1.626 m)  Wt 151 lb (68.5 kg)   BMI 25.92 kg/m  Repeat BP was 189/117 10 minutes later. Physical Exam Constitutional:      Appearance: She is well-developed.  HENT:     Head: Normocephalic.     Right Ear: External ear normal.     Left Ear: External ear normal.  Eyes:     Pupils: Pupils are equal, round, and reactive to light.  Neck:     Thyroid: No thyromegaly.     Trachea: No tracheal deviation.  Cardiovascular:     Rate and Rhythm: Normal rate.  Pulmonary:     Effort: Pulmonary effort is normal.  Abdominal:     Palpations: Abdomen is soft.  Skin:    General: Skin is warm and dry.  Neurological:     Mental Status: She is alert and oriented to person, place, and time.  Psychiatric:        Behavior: Behavior normal.     Ortho Exam negative logroll's both hips.  She can get from sitting to standing.  Gastrocsoleus anterior tib are intact.  She has some sciatic notch tenderness both right and left.  Distal pulses are 2+.  Peroneals posterior tib are normal to testing.  Specialty Comments:  No specialty comments available.  Imaging: CLINICAL DATA: Low back and bilateral thigh pain for 4 months.  EXAM: MRI LUMBAR SPINE WITHOUT CONTRAST  TECHNIQUE: Multiplanar, multisequence  MR imaging of the lumbar spine was performed. No intravenous contrast was administered.  COMPARISON: CT abdomen and pelvis 05/25/2010  FINDINGS: Segmentation: Standard.  Alignment: Facet mediated anterolisthesis of L4 on L5 measuring 6 mm.  Vertebrae: No fracture, suspicious osseous lesion, or significant marrow edema.  Conus medullaris and cauda equina: Conus extends to the L1 level. Conus and cauda equina appear normal.  Paraspinal and other soft tissues: Unremarkable.  Disc levels:  Disc desiccation throughout the lumbar and included lower thoracic spine. Mild disc space narrowing at L3-4 and moderate narrowing at L4-5.  L1-2: Mild facet hypertrophy without disc herniation or stenosis.  L2-3: Minimal disc bulging and mild-to-moderate facet hypertrophy without stenosis.  L3-4: Disc bulging, a left foraminal to extraforaminal disc protrusion, moderate facet and ligamentum flavum hypertrophy, and mildly prominent dorsal epidural fat result in moderate spinal stenosis and mild right and moderate left neural foraminal stenosis.  L4-5: Anterolisthesis with bulging uncovered disc and severe facet and ligamentum flavum hypertrophy result in severe spinal stenosis and severe bilateral neural foraminal stenosis. Potential bilateral L4 nerve root impingement.  L5-S1: Minimal disc bulging and severe right and mild left facet hypertrophy without stenosis.  IMPRESSION: Disc and facet degeneration greatest at L4-5 where there is grade 1 anterolisthesis, severe spinal stenosis, and severe bilateral neural foraminal stenosis.   Electronically Signed By: Logan Bores M.D. On: 07/19/2018 15:41    PMFS History:     Patient Active Problem List   Diagnosis Date Noted  . Spinal stenosis of lumbar region with neurogenic claudication 08/10/2018  . Foraminal stenosis of lumbar region 08/10/2018  . DDD (degenerative disc disease), lumbar 07/06/2018  .  Osteoporosis 04/24/2018  . Encounter for gynecological examination with Papanicolaou smear of cervix 09/08/2015  . Abnormal Pap smear of vagina 10/15/2014  . Acquired trigger finger 09/03/2014  . Constipation 04/04/2014  . VAIN (vaginal intraepithelial neoplasia) 06/20/2013  . Knee pain, right 06/14/2013  . Allergic rhinitis 08/22/2012  . Neuropathy, arm 02/19/2012  . GERD (gastroesophageal reflux disease) 02/19/2012  . Alopecia 02/19/2012  . Anxiety 10/03/2011  . Tremor 10/03/2011  .  Tobacco use 10/03/2011  . OA (osteoarthritis) 08/11/2011  . Cancer ([place]) 08/11/2011  . Essential hypertension, benign 08/09/2011  . Vitamin D deficiency 08/09/2011       Past Medical History:  Diagnosis Date  . Back pain   . Carpal tunnel syndrome   . Constipation   . Hypertension   . Low grade squamous intraepith lesion on cytologic smear cervix (lgsil)    +hpv, HAD HYSTERECTOMY  . OA (osteoarthritis)          Family History  Problem Relation Age of Onset  . Diabetes Father   . Cancer Father        prostate cancer  . Cancer Sister        breast  . Hypertension Son   . Cancer Maternal Grandmother   . Eczema Daughter   . Other Daughter        stomach issues         Past Surgical History:  Procedure Laterality Date  . ABDOMINAL HYSTERECTOMY  2006  . BREAST CYST EXCISION     right  . BUNIONECTOMY     left  . CARPAL TUNNEL RELEASE Right   . ESOPHAGOGASTRODUODENOSCOPY  06/13/2012   Procedure: ESOPHAGOGASTRODUODENOSCOPY (EGD);  Surgeon: Rogene Houston, MD;  Location: [facility];  Service: Endoscopy;  Laterality: N/A;  200  . TRIGGER FINGER RELEASE Right 08/25/2016   Procedure: RELEASE TRIGGER FINGER/A-1 PULLEY RIGHT LONG TRIGGER FINGER RELEASE;  Surgeon: Carole Civil, MD;  Location: [facility];  Service: Orthopedics;  Laterality: Right;   Social History        Occupational History  . Not on file  Tobacco Use  . Smoking status: Former Smoker     Packs/day: 0.25    Years: 50.00    Pack years: 12.50    Types: Cigarettes    Quit date: 07/28/2013    Years since quitting: 5.5  . Smokeless tobacco: Never Used  Substance and Sexual Activity  . Alcohol use: Yes    Alcohol/week: 0.0 standard drinks    Comment: occ  . Drug use: No  . Sexual activity: Not Currently    Birth control/protection: Surgical    Comment: hyst              Electronically signed by Marybelle Killings, MD at 02/05/2019 5:51 PM

## 2019-02-12 NOTE — Anesthesia Preprocedure Evaluation (Addendum)
Anesthesia Evaluation  Patient identified by MRN, date of birth, ID band Patient awake    Reviewed: Allergy & Precautions, NPO status , Patient's Chart, lab work & pertinent test results  History of Anesthesia Complications Negative for: history of anesthetic complications  Airway Mallampati: II  TM Distance: >3 FB Neck ROM: Full    Dental  (+) Dental Advisory Given   Pulmonary neg recent URI, former smoker,    breath sounds clear to auscultation       Cardiovascular hypertension,  Rhythm:Regular     Neuro/Psych PSYCHIATRIC DISORDERS Anxiety  Neuromuscular disease    GI/Hepatic Neg liver ROS, GERD  ,  Endo/Other  negative endocrine ROS  Renal/GU negative Renal ROS     Musculoskeletal  (+) Arthritis ,   Abdominal   Peds  Hematology negative hematology ROS (+)   Anesthesia Other Findings   Reproductive/Obstetrics                                                             Anesthesia Evaluation  Patient identified by MRN, date of birth, ID band Patient awake    Reviewed: Allergy & Precautions, NPO status , Patient's Chart, lab work & pertinent test results, reviewed documented beta blocker date and time   Airway Mallampati: II  TM Distance: >3 FB     Dental  (+) Teeth Intact   Pulmonary former smoker,    breath sounds clear to auscultation       Cardiovascular hypertension, Pt. on medications and Pt. on home beta blockers  Rhythm:Regular Rate:Normal     Neuro/Psych PSYCHIATRIC DISORDERS Anxiety  Neuromuscular disease    GI/Hepatic GERD  Controlled and Medicated,  Endo/Other    Renal/GU      Musculoskeletal  (+) Arthritis ,   Abdominal   Peds  Hematology   Anesthesia Other Findings   Reproductive/Obstetrics                            Anesthesia Physical Anesthesia Plan  ASA: II  Anesthesia Plan: Bier Block   Post-op  Pain Management:    Induction:   Airway Management Planned: Simple Face Mask  Additional Equipment:   Intra-op Plan:   Post-operative Plan:   Informed Consent: I have reviewed the patients History and Physical, chart, labs and discussed the procedure including the risks, benefits and alternatives for the proposed anesthesia with the patient or authorized representative who has indicated his/her understanding and acceptance.     Plan Discussed with:   Anesthesia Plan Comments:         Anesthesia Quick Evaluation  Anesthesia Physical Anesthesia Plan  ASA: II  Anesthesia Plan: General   Post-op Pain Management:    Induction: Intravenous  PONV Risk Score and Plan: 3 and Ondansetron and Dexamethasone  Airway Management Planned: Oral ETT  Additional Equipment: None  Intra-op Plan:   Post-operative Plan: Extubation in OR  Informed Consent: I have reviewed the patients History and Physical, chart, labs and discussed the procedure including the risks, benefits and alternatives for the proposed anesthesia with the patient or authorized representative who has indicated his/her understanding and acceptance.       Plan Discussed with: CRNA and Surgeon  Anesthesia Plan Comments: (PAT note written 02/12/2019 by  Myra Gianotti, PA-C. )       Anesthesia Quick Evaluation

## 2019-02-12 NOTE — Progress Notes (Signed)
Anesthesia Chart Review:  Case: [id number] Date/Time: 02/13/19 1215   Procedure: L4-5 GILL PROCEDURE TRANSFORAMINAL LUMBAR INTERBODY FUSION, PEDICLE INSTRUMENTATION, BILATERAL LATERAL FUSION (N/A )   Anesthesia type: General   Pre-op diagnosis: L4-5 severe stenosis, listhesis   Location: [facility]OM [facility] / [facility]   Surgeon: Marybelle Killings, MD      DISCUSSION: Patient is a 73 year old female scheduled for the above procedure.  History includes former smoker (quit 2015), HTN, hysterectomy (for LGSIL, HPV).   BP 146/98 at PAT. She is on propranolol twice daily and was instructed to take her morning dose on the day of surgery. She will get Vitals on arrival for surgery.   Labs acceptable for OR. EKG stable. She denied SOB, chest pain, cough, fever at PAT RN visit. Presurgical COVID-19 test on 02/11/19 was negative. Anesthesia team to evaluate on the day of surgery.   VS: BP (!) 146/98   Pulse 65   Temp 36.9 C (Oral)   Resp 18   Ht 5' 4.5" (1.638 m)   Wt 66.5 kg   SpO2 99%   BMI 24.76 kg/m   PROVIDERS: [place], [place] Nunnery, MD is PCP    LABS: Labs reviewed: Acceptable for surgery. (all labs ordered are listed, but only abnormal results are displayed)  Labs Reviewed  SURGICAL PCR SCREEN - Abnormal; Notable for the following components:      Result Value   Staphylococcus aureus POSITIVE (*)    All other components within normal limits  COMPREHENSIVE METABOLIC PANEL - Abnormal; Notable for the following components:   Glucose, Bld 100 (*)    Creatinine, Ser 1.03 (*)    GFR calc non Af Amer 54 (*)    All other components within normal limits  CBC  TYPE AND SCREEN  ABO/RH     IMAGES: CXR 02/11/19: FINDINGS: The heart size and mediastinal contours are within normal limits. Both lungs are clear. Degenerative changes of the spine. IMPRESSION: No active cardiopulmonary disease.  MRI L-spine 07/19/18: IMPRESSION: Disc and facet degeneration greatest at L4-5 where there is grade  1 anterolisthesis, severe spinal stenosis, and severe bilateral neural foraminal stenosis.   EKG: 02/11/19: Normal sinus rhythm Minimal voltage criteria for LVH, may be normal variant Septal infarct , age undetermined Abnormal ECG No significant change since last tracing [08/23/16] Confirmed by Carlyle Dolly [phone]) on 02/11/2019 6:03:49 PM   CV: N/A  Past Medical History:  Diagnosis Date  . Back pain   . Carpal tunnel syndrome   . Constipation   . Hypertension   . Low grade squamous intraepith lesion on cytologic smear cervix (lgsil)    +hpv, HAD HYSTERECTOMY  . OA (osteoarthritis)     Past Surgical History:  Procedure Laterality Date  . ABDOMINAL HYSTERECTOMY  2006  . BREAST CYST EXCISION     right  . BUNIONECTOMY     left  . CARPAL TUNNEL RELEASE Right   . ESOPHAGOGASTRODUODENOSCOPY  06/13/2012   Procedure: ESOPHAGOGASTRODUODENOSCOPY (EGD);  Surgeon: Rogene Houston, MD;  Location: [facility] SUITE;  Service: Endoscopy;  Laterality: N/A;  200  . EYE SURGERY Bilateral 2020  . TRIGGER FINGER RELEASE Right 08/25/2016   Procedure: RELEASE TRIGGER FINGER/A-1 PULLEY RIGHT LONG TRIGGER FINGER RELEASE;  Surgeon: Carole Civil, MD;  Location: [facility] ORS;  Service: Orthopedics;  Laterality: Right;    MEDICATIONS: . docusate sodium (COLACE) 100 MG capsule  . ketorolac (ACULAR) 0.5 % ophthalmic solution  . magnesium citrate solution  . Multiple Vitamin (  MULTIVITAMIN WITH MINERALS) TABS tablet  . naproxen sodium (ANAPROX) 220 MG tablet  . OVER THE COUNTER MEDICATION  . pantoprazole (PROTONIX) 40 MG tablet  . polyethylene glycol powder (GLYCOLAX/MIRALAX) powder  . prednisoLONE acetate (PRED FORTE) 1 % ophthalmic suspension  . propranolol (INDERAL) 20 MG tablet   No current facility-administered medications for this encounter.     Myra Gianotti, PA-C Surgical Short Stay/Anesthesiology [facility] Phone [phone] [facility] Phone [phone] 02/12/2019 11:36 AM

## 2019-02-12 NOTE — Telephone Encounter (Signed)
Received following message from Guinevere Ferrari, RN:  Rachel [name], this patient is being seen for her pre-op appointment, and her BP today was 155/97 and 146/94. I wanted to let you and her PCP know based on the notes from her elevated BP at ortho- her surgery is scheduled for 02/13/19. Thanks!   Per MD, if BP remains elevated, patient will require OV to assess.   Call placed to patient. No answer. VM full.

## 2019-02-13 ENCOUNTER — Inpatient Hospital Stay (HOSPITAL_COMMUNITY): Payer: Medicare Other

## 2019-02-13 ENCOUNTER — Inpatient Hospital Stay (HOSPITAL_COMMUNITY): Payer: Medicare Other | Admitting: Vascular Surgery

## 2019-02-13 ENCOUNTER — Encounter (HOSPITAL_COMMUNITY)
Admission: RE | Disposition: A | Discharge status: Home / Self Care | Payer: Self-pay | Source: Home / Self Care | Attending: Orthopaedic Surgery

## 2019-02-13 ENCOUNTER — Inpatient Hospital Stay (HOSPITAL_COMMUNITY)
Admission: RE | Admit: 2019-02-13 | Discharge: 2019-02-16 | DRG: 455 | Disposition: A | Discharge status: Home Health | Payer: Medicare Other | Attending: Orthopaedic Surgery | Admitting: Orthopaedic Surgery

## 2019-02-13 ENCOUNTER — Other Ambulatory Visit: Payer: Self-pay

## 2019-02-13 ENCOUNTER — Encounter (HOSPITAL_COMMUNITY): Payer: Self-pay

## 2019-02-13 ENCOUNTER — Inpatient Hospital Stay (HOSPITAL_COMMUNITY): Payer: Medicare Other | Admitting: Certified Registered Nurse Anesthetist

## 2019-02-13 DIAGNOSIS — E559 Vitamin D deficiency, unspecified: Secondary | ICD-10-CM | POA: Diagnosis present

## 2019-02-13 DIAGNOSIS — M48062 Spinal stenosis, lumbar region with neurogenic claudication: Secondary | ICD-10-CM

## 2019-02-13 DIAGNOSIS — Z20828 Contact with and (suspected) exposure to other viral communicable diseases: Secondary | ICD-10-CM | POA: Diagnosis present

## 2019-02-13 DIAGNOSIS — M810 Age-related osteoporosis without current pathological fracture: Secondary | ICD-10-CM | POA: Diagnosis present

## 2019-02-13 DIAGNOSIS — Z87891 Personal history of nicotine dependence: Secondary | ICD-10-CM | POA: Diagnosis not present

## 2019-02-13 DIAGNOSIS — M48061 Spinal stenosis, lumbar region without neurogenic claudication: Secondary | ICD-10-CM | POA: Diagnosis not present

## 2019-02-13 DIAGNOSIS — I10 Essential (primary) hypertension: Secondary | ICD-10-CM | POA: Diagnosis not present

## 2019-02-13 DIAGNOSIS — M47816 Spondylosis without myelopathy or radiculopathy, lumbar region: Secondary | ICD-10-CM | POA: Diagnosis not present

## 2019-02-13 DIAGNOSIS — K219 Gastro-esophageal reflux disease without esophagitis: Secondary | ICD-10-CM | POA: Diagnosis not present

## 2019-02-13 DIAGNOSIS — Z22321 Carrier or suspected carrier of Methicillin susceptible Staphylococcus aureus: Secondary | ICD-10-CM

## 2019-02-13 DIAGNOSIS — J309 Allergic rhinitis, unspecified: Secondary | ICD-10-CM | POA: Diagnosis present

## 2019-02-13 DIAGNOSIS — L659 Nonscarring hair loss, unspecified: Secondary | ICD-10-CM | POA: Diagnosis present

## 2019-02-13 DIAGNOSIS — F419 Anxiety disorder, unspecified: Secondary | ICD-10-CM | POA: Diagnosis present

## 2019-02-13 DIAGNOSIS — M4316 Spondylolisthesis, lumbar region: Secondary | ICD-10-CM | POA: Diagnosis not present

## 2019-02-13 DIAGNOSIS — M4326 Fusion of spine, lumbar region: Secondary | ICD-10-CM | POA: Diagnosis not present

## 2019-02-13 DIAGNOSIS — Z419 Encounter for procedure for purposes other than remedying health state, unspecified: Secondary | ICD-10-CM

## 2019-02-13 DIAGNOSIS — Z90710 Acquired absence of both cervix and uterus: Secondary | ICD-10-CM

## 2019-02-13 SURGERY — POSTERIOR LUMBAR FUSION 1 LEVEL
Anesthesia: General | Site: Back

## 2019-02-13 MED ORDER — PANTOPRAZOLE SODIUM 40 MG PO TBEC
40.0000 mg | DELAYED_RELEASE_TABLET | Freq: Every day | ORAL | Status: DC
  Administered 2019-02-14 – 2019-02-16 (×3): 40 mg via ORAL
  Filled 2019-02-13 (×3): qty 1

## 2019-02-13 MED ORDER — SODIUM CHLORIDE 0.9% FLUSH
3.0000 mL | Freq: Two times a day (BID) | INTRAVENOUS | Status: DC
  Administered 2019-02-15 – 2019-02-16 (×2): 3 mL via INTRAVENOUS

## 2019-02-13 MED ORDER — 0.9 % SODIUM CHLORIDE (POUR BTL) OPTIME
TOPICAL | Status: DC | PRN
  Administered 2019-02-13: 1000 mL

## 2019-02-13 MED ORDER — CEFAZOLIN SODIUM-DEXTROSE 2-4 GM/100ML-% IV SOLN
2.0000 g | INTRAVENOUS | Status: AC
  Administered 2019-02-13: 2 g via INTRAVENOUS

## 2019-02-13 MED ORDER — PROPRANOLOL HCL 20 MG PO TABS
20.0000 mg | ORAL_TABLET | Freq: Two times a day (BID) | ORAL | Status: DC
  Administered 2019-02-13 – 2019-02-16 (×6): 20 mg via ORAL
  Filled 2019-02-13 (×8): qty 1

## 2019-02-13 MED ORDER — BUPIVACAINE HCL (PF) 0.5 % IJ SOLN
INTRAMUSCULAR | Status: AC
  Filled 2019-02-13: qty 30

## 2019-02-13 MED ORDER — FENTANYL CITRATE (PF) 100 MCG/2ML IJ SOLN
INTRAMUSCULAR | Status: AC
  Filled 2019-02-13: qty 2

## 2019-02-13 MED ORDER — SODIUM CHLORIDE 0.9 % IV SOLN
INTRAVENOUS | Status: DC
  Administered 2019-02-13: 21:00:00 via INTRAVENOUS

## 2019-02-13 MED ORDER — THROMBIN 20000 UNITS EX SOLR
CUTANEOUS | Status: DC | PRN
  Administered 2019-02-13: 20 mL via TOPICAL

## 2019-02-13 MED ORDER — SODIUM CHLORIDE 0.9 % IV SOLN
INTRAVENOUS | Status: DC | PRN
  Administered 2019-02-13: 15 ug/min via INTRAVENOUS

## 2019-02-13 MED ORDER — KETOROLAC TROMETHAMINE 0.5 % OP SOLN
1.0000 [drp] | Freq: Four times a day (QID) | OPHTHALMIC | Status: DC
  Administered 2019-02-13 – 2019-02-16 (×10): 1 [drp] via OPHTHALMIC
  Filled 2019-02-13: qty 5

## 2019-02-13 MED ORDER — HEMOSTATIC AGENTS (NO CHARGE) OPTIME
TOPICAL | Status: DC | PRN
  Administered 2019-02-13: 1 via TOPICAL

## 2019-02-13 MED ORDER — FENTANYL CITRATE (PF) 100 MCG/2ML IJ SOLN
25.0000 ug | INTRAMUSCULAR | Status: DC | PRN
  Administered 2019-02-13 (×2): 25 ug via INTRAVENOUS

## 2019-02-13 MED ORDER — ROCURONIUM BROMIDE 10 MG/ML (PF) SYRINGE
PREFILLED_SYRINGE | INTRAVENOUS | Status: AC
  Filled 2019-02-13: qty 10

## 2019-02-13 MED ORDER — DEXAMETHASONE SODIUM PHOSPHATE 10 MG/ML IJ SOLN
INTRAMUSCULAR | Status: AC
  Filled 2019-02-13: qty 1

## 2019-02-13 MED ORDER — HYDROMORPHONE HCL 1 MG/ML IJ SOLN
0.5000 mg | INTRAMUSCULAR | Status: DC | PRN
  Administered 2019-02-13: 0.5 mg via INTRAVENOUS
  Filled 2019-02-13: qty 1

## 2019-02-13 MED ORDER — LIDOCAINE 2% (20 MG/ML) 5 ML SYRINGE
INTRAMUSCULAR | Status: AC
  Filled 2019-02-13: qty 5

## 2019-02-13 MED ORDER — OXYCODONE HCL 5 MG PO TABS
5.0000 mg | ORAL_TABLET | ORAL | Status: DC | PRN
  Administered 2019-02-14 – 2019-02-16 (×7): 5 mg via ORAL
  Filled 2019-02-13 (×7): qty 1

## 2019-02-13 MED ORDER — SODIUM CHLORIDE 0.9% FLUSH: 3.0000 mL | INTRAVENOUS | Status: DC | PRN

## 2019-02-13 MED ORDER — LACTATED RINGERS IV SOLN
INTRAVENOUS | Status: DC
  Administered 2019-02-13 (×2): via INTRAVENOUS

## 2019-02-13 MED ORDER — ALBUMIN HUMAN 5 % IV SOLN
INTRAVENOUS | Status: DC | PRN
  Administered 2019-02-13 (×2): via INTRAVENOUS

## 2019-02-13 MED ORDER — LIDOCAINE 20MG/ML (2%) 15 ML SYRINGE OPTIME
INTRAMUSCULAR | Status: DC | PRN
  Administered 2019-02-13: 60 mg via INTRAVENOUS

## 2019-02-13 MED ORDER — PROPOFOL 10 MG/ML IV BOLUS
INTRAVENOUS | Status: DC | PRN
  Administered 2019-02-13: 70 mg via INTRAVENOUS
  Administered 2019-02-13 (×2): 20 mg via INTRAVENOUS

## 2019-02-13 MED ORDER — ACETAMINOPHEN 650 MG RE SUPP: 650.0000 mg | RECTAL | Status: DC | PRN

## 2019-02-13 MED ORDER — THROMBIN 5000 UNITS EX SOLR
CUTANEOUS | Status: AC
  Filled 2019-02-13: qty 5000

## 2019-02-13 MED ORDER — ACETAMINOPHEN 325 MG PO TABS
650.0000 mg | ORAL_TABLET | ORAL | Status: DC | PRN
  Administered 2019-02-13 – 2019-02-16 (×7): 650 mg via ORAL
  Filled 2019-02-13 (×7): qty 2

## 2019-02-13 MED ORDER — FENTANYL CITRATE (PF) 250 MCG/5ML IJ SOLN
INTRAMUSCULAR | Status: AC
  Filled 2019-02-13: qty 5

## 2019-02-13 MED ORDER — CEFAZOLIN SODIUM-DEXTROSE 2-4 GM/100ML-% IV SOLN
INTRAVENOUS | Status: AC
  Filled 2019-02-13: qty 100

## 2019-02-13 MED ORDER — ONDANSETRON HCL 4 MG/2ML IJ SOLN
4.0000 mg | Freq: Four times a day (QID) | INTRAMUSCULAR | Status: DC | PRN
  Administered 2019-02-13: 4 mg via INTRAVENOUS
  Filled 2019-02-13: qty 2

## 2019-02-13 MED ORDER — THROMBIN 5000 UNITS EX SOLR
CUTANEOUS | Status: DC | PRN
  Administered 2019-02-13: 5000 [IU] via TOPICAL

## 2019-02-13 MED ORDER — FENTANYL CITRATE (PF) 100 MCG/2ML IJ SOLN
INTRAMUSCULAR | Status: DC | PRN
  Administered 2019-02-13 (×3): 50 ug via INTRAVENOUS
  Administered 2019-02-13: 100 ug via INTRAVENOUS
  Administered 2019-02-13: 50 ug via INTRAVENOUS

## 2019-02-13 MED ORDER — ONDANSETRON HCL 4 MG PO TABS: 4.0000 mg | ORAL_TABLET | Freq: Four times a day (QID) | ORAL | Status: DC | PRN

## 2019-02-13 MED ORDER — PROPOFOL 10 MG/ML IV BOLUS
INTRAVENOUS | Status: AC
  Filled 2019-02-13: qty 20

## 2019-02-13 MED ORDER — MENTHOL 3 MG MT LOZG
1.0000 | LOZENGE | OROMUCOSAL | Status: DC | PRN
  Filled 2019-02-13: qty 9

## 2019-02-13 MED ORDER — PREDNISOLONE ACETATE 1 % OP SUSP
1.0000 [drp] | Freq: Two times a day (BID) | OPHTHALMIC | Status: DC
  Administered 2019-02-14 – 2019-02-16 (×5): 1 [drp] via OPHTHALMIC
  Filled 2019-02-13: qty 5

## 2019-02-13 MED ORDER — PHENOL 1.4 % MT LIQD: 1.0000 | OROMUCOSAL | Status: DC | PRN

## 2019-02-13 MED ORDER — CEFAZOLIN SODIUM-DEXTROSE 1-4 GM/50ML-% IV SOLN
1.0000 g | Freq: Three times a day (TID) | INTRAVENOUS | Status: AC
  Administered 2019-02-13 – 2019-02-14 (×3): 1 g via INTRAVENOUS
  Filled 2019-02-13 (×3): qty 50

## 2019-02-13 MED ORDER — THROMBIN 20000 UNITS EX SOLR
CUTANEOUS | Status: AC
  Filled 2019-02-13: qty 20000

## 2019-02-13 MED ORDER — DOCUSATE SODIUM 100 MG PO CAPS
100.0000 mg | ORAL_CAPSULE | Freq: Two times a day (BID) | ORAL | Status: DC
  Administered 2019-02-13 – 2019-02-16 (×6): 100 mg via ORAL
  Filled 2019-02-13 (×6): qty 1

## 2019-02-13 MED ORDER — CHLORHEXIDINE GLUCONATE 4 % EX LIQD: 60.0000 mL | Freq: Once | CUTANEOUS | Status: DC

## 2019-02-13 MED ORDER — FLEET ENEMA 7-19 GM/118ML RE ENEM: 1.0000 | ENEMA | Freq: Once | RECTAL | Status: DC | PRN

## 2019-02-13 MED ORDER — EPHEDRINE SULFATE-NACL 50-0.9 MG/10ML-% IV SOSY
PREFILLED_SYRINGE | INTRAVENOUS | Status: DC | PRN
  Administered 2019-02-13 (×2): 5 mg via INTRAVENOUS
  Administered 2019-02-13: 10 mg via INTRAVENOUS

## 2019-02-13 MED ORDER — POLYETHYLENE GLYCOL 3350 17 G PO PACK: 17.0000 g | PACK | Freq: Every day | ORAL | Status: DC | PRN

## 2019-02-13 MED ORDER — DEXAMETHASONE SODIUM PHOSPHATE 10 MG/ML IJ SOLN
INTRAMUSCULAR | Status: DC | PRN
  Administered 2019-02-13: 4 mg via INTRAVENOUS

## 2019-02-13 MED ORDER — SODIUM CHLORIDE 0.9 % IV SOLN: 250.0000 mL | INTRAVENOUS | Status: DC

## 2019-02-13 MED ORDER — ONDANSETRON HCL 4 MG/2ML IJ SOLN
INTRAMUSCULAR | Status: DC | PRN
  Administered 2019-02-13: 4 mg via INTRAVENOUS

## 2019-02-13 MED ORDER — ONDANSETRON HCL 4 MG/2ML IJ SOLN
INTRAMUSCULAR | Status: AC
  Filled 2019-02-13: qty 2

## 2019-02-13 MED ORDER — ROCURONIUM BROMIDE 10 MG/ML (PF) SYRINGE
PREFILLED_SYRINGE | INTRAVENOUS | Status: DC | PRN
  Administered 2019-02-13 (×2): 20 mg via INTRAVENOUS
  Administered 2019-02-13: 50 mg via INTRAVENOUS

## 2019-02-13 MED ORDER — BUPIVACAINE HCL (PF) 0.5 % IJ SOLN
INTRAMUSCULAR | Status: DC | PRN
  Administered 2019-02-13: 20 mL

## 2019-02-13 SURGICAL SUPPLY — 78 items
ADH SKN CLS APL DERMABOND .7 (GAUZE/BANDAGES/DRESSINGS) ×1
AGENT HMST KT MTR STRL THRMB (HEMOSTASIS)
AGENT HMST MTR 8 SURGIFLO (HEMOSTASIS) ×1
BLADE CLIPPER SURG (BLADE) IMPLANT
BUR ROUND FLUTED 4 SOFT TCH (BURR) ×2 IMPLANT
CABLE BIPOLOR RESECTION CORD (MISCELLANEOUS) ×1 IMPLANT
CAP SPINAL LOCKING TI (Cap) ×5 IMPLANT
CONT SPEC 4OZ CLIKSEAL STRL BL (MISCELLANEOUS) ×1 IMPLANT
COVER BACK TABLE 80X110 HD (DRAPES) ×2 IMPLANT
COVER MAYO STAND STRL (DRAPES) ×1 IMPLANT
COVER SURGICAL LIGHT HANDLE (MISCELLANEOUS) ×1 IMPLANT
COVER WAND RF STERILE (DRAPES) ×1 IMPLANT
DERMABOND ADVANCED (GAUZE/BANDAGES/DRESSINGS) ×1
DERMABOND ADVANCED .7 DNX12 (GAUZE/BANDAGES/DRESSINGS) ×1 IMPLANT
DRAPE C-ARM 42X72 X-RAY (DRAPES) ×2 IMPLANT
DRAPE C-ARMOR (DRAPES) ×1 IMPLANT
DRAPE LAPAROTOMY T 102X78X121 (DRAPES) ×2 IMPLANT
DRAPE MICROSCOPE LEICA (MISCELLANEOUS) ×2 IMPLANT
DRAPE SURG 17X23 STRL (DRAPES) ×7 IMPLANT
DRSG EMULSION OIL 3X3 NADH (GAUZE/BANDAGES/DRESSINGS) ×1 IMPLANT
DRSG MEPILEX BORDER 4X4 (GAUZE/BANDAGES/DRESSINGS) ×1 IMPLANT
DRSG MEPILEX BORDER 4X8 (GAUZE/BANDAGES/DRESSINGS) ×2 IMPLANT
DRSG PAD ABDOMINAL 8X10 ST (GAUZE/BANDAGES/DRESSINGS) ×2 IMPLANT
DURAPREP 26ML APPLICATOR (WOUND CARE) ×2 IMPLANT
ELECT BLADE 4.0 EZ CLEAN MEGAD (MISCELLANEOUS) ×2
ELECT CAUTERY BLADE 6.4 (BLADE) ×1 IMPLANT
ELECT REM PT RETURN 9FT ADLT (ELECTROSURGICAL) ×2
ELECTRODE BLDE 4.0 EZ CLN MEGD (MISCELLANEOUS) ×1 IMPLANT
ELECTRODE REM PT RTRN 9FT ADLT (ELECTROSURGICAL) ×1 IMPLANT
EVACUATOR 1/8 PVC DRAIN (DRAIN) ×1 IMPLANT
GLOVE BIOGEL PI IND STRL 8 (GLOVE) ×2 IMPLANT
GLOVE BIOGEL PI INDICATOR 8 (GLOVE) ×5
GLOVE ECLIPSE 7.5 STRL STRAW (GLOVE) ×4 IMPLANT
GLOVE ORTHO TXT STRL SZ7.5 (GLOVE) ×4 IMPLANT
GOWN STRL REUS W/ TWL LRG LVL3 (GOWN DISPOSABLE) ×1 IMPLANT
GOWN STRL REUS W/ TWL XL LVL3 (GOWN DISPOSABLE) ×1 IMPLANT
GOWN STRL REUS W/TWL 2XL LVL3 (GOWN DISPOSABLE) ×4 IMPLANT
GOWN STRL REUS W/TWL LRG LVL3 (GOWN DISPOSABLE)
GOWN STRL REUS W/TWL XL LVL3 (GOWN DISPOSABLE) ×2
HEMOSTAT SURGICEL 2X14 (HEMOSTASIS) IMPLANT
KIT BASIN OR (CUSTOM PROCEDURE TRAY) ×2 IMPLANT
KIT POSITION SURG JACKSON T1 (MISCELLANEOUS) ×1 IMPLANT
KIT TURNOVER KIT B (KITS) ×2 IMPLANT
MANIFOLD NEPTUNE II (INSTRUMENTS) ×1 IMPLANT
NS IRRIG 1000ML POUR BTL (IV SOLUTION) ×2 IMPLANT
PACK LAMINECTOMY ORTHO (CUSTOM PROCEDURE TRAY) ×2 IMPLANT
PAD ARMBOARD 7.5X6 YLW CONV (MISCELLANEOUS) ×4 IMPLANT
PATTIES SURGICAL .5 X.5 (GAUZE/BANDAGES/DRESSINGS) IMPLANT
PATTIES SURGICAL .75X.75 (GAUZE/BANDAGES/DRESSINGS) ×2 IMPLANT
ROD 45MM (Rod) ×4 IMPLANT
ROD SPNL CVD 45X5.5XHRD NS (Rod) IMPLANT
SCREW MATRIX MIS 6.0X40MM (Screw) ×4 IMPLANT
SLEEVE SURGEON STRL (DRAPES) ×1 IMPLANT
SPACER TPAL 10X10X28MM (Spacer) ×1 IMPLANT
SPOGE SURGIFLO 8M (HEMOSTASIS) ×1
SPONGE LAP 18X18 RF (DISPOSABLE) IMPLANT
SPONGE LAP 4X18 RFD (DISPOSABLE) ×4 IMPLANT
SPONGE SURGIFLO 8M (HEMOSTASIS) IMPLANT
SPONGE SURGIFOAM ABS GEL 100 (HEMOSTASIS) ×1 IMPLANT
STAPLER VISISTAT 35W (STAPLE) ×1 IMPLANT
SURGIFLO W/THROMBIN 8M KIT (HEMOSTASIS) ×1 IMPLANT
SUT BONE WAX W31G (SUTURE) ×2 IMPLANT
SUT VIC AB 0 CT1 27 (SUTURE) ×2
SUT VIC AB 0 CT1 27XBRD ANBCTR (SUTURE) IMPLANT
SUT VIC AB 1 CTX 27 (SUTURE) ×1 IMPLANT
SUT VIC AB 1 CTX 36 (SUTURE) ×2
SUT VIC AB 1 CTX36XBRD ANBCTR (SUTURE) ×1 IMPLANT
SUT VIC AB 2-0 CT1 27 (SUTURE) ×2
SUT VIC AB 2-0 CT1 TAPERPNT 27 (SUTURE) ×1 IMPLANT
SUT VIC AB 3-0 X1 27 (SUTURE) IMPLANT
SYR 20ML ECCENTRIC (SYRINGE) ×1 IMPLANT
TAP CANN 5MM (TAP) ×1 IMPLANT
TOWEL GREEN STERILE (TOWEL DISPOSABLE) ×2 IMPLANT
TOWEL GREEN STERILE FF (TOWEL DISPOSABLE) ×2 IMPLANT
TRAY FOLEY MTR SLVR 16FR STAT (SET/KITS/TRAYS/PACK) ×1 IMPLANT
TRAY FOLEY W/BAG SLVR 14FR (SET/KITS/TRAYS/PACK) ×1 IMPLANT
WATER STERILE IRR 1000ML POUR (IV SOLUTION) ×2 IMPLANT
YANKAUER SUCT BULB TIP NO VENT (SUCTIONS) ×2 IMPLANT

## 2019-02-13 NOTE — Op Note (Addendum)
Preop diagnosis: L4-5 lumbar spinal stenosis with instability, severe central stenosis and severe bi- foraminal stenosis.  Postoperative diagnosis: Same  Procedure: Gill procedure with removal of unstable posterior elements decompression L4-5, complete laminectomy L4.  Right transforaminal interbody fusion, local bone +10 mm cage packed with bone.  1 level L4-5 pedicle instrumentation.  Bilateral intertransverse process fusion.  Microscope assisted. ( one level 360 fusion with instrumentation )  Surgeon: Rodell Perna, MD  Assistant: Benjiman Core, PA-C medically necessary and present for the entire procedure  Anesthesia General oral tracheal.  Implants: Synthes matrix pedicle instrumentation 6 x 40 screws x4.  10 mm banana-shaped T-PAL cage with local bone.  45 mm rods x2.  Procedure: After induction general anesthesia Ancef prophylaxis with her positive PCR for MSSA patient was intubated placed prone careful positioning arms at 9090 yellow foam pads yellow rolled crates onto the shoulders on the spine frame with calf pumpers for DVT prophylaxis area squared with 1010 drapes Foley catheter was placed before patient was flipped over in the prone position on the spine frame.  As the DuraPrep was dry and timeout procedure was completed sterile skin marker was used for transverse lines for later closure 1010 drapes have been applied x4 Betadine Steri-Drape and laminectomy sheets and drapes were applied.  Spinal needle localization with 18-gauge spinal for the area for planned decompression was performed starting at the upper pedicle of L4 and then for needle placed mid position at L5 vertebral body.  Midline incision was made subperiosteal dissection onto the lamina and then Kocher clamps were placed adjusted spot film taken with the sterilely draped C arm using the C armor drape for protection to maintain sterility.  Clamps were removed bone was marked with purple skin markers and central decompression was  performed.  Patient had severe multifactorial stenosis with wobbly posterior element at L4 with severe degenerative facet changes.  Facet shaped like small mushrooms and overhanging facets were removed as well as thick chunks of ligament.  Operative microscope was used to dissected the adherent posterior longitudinal ligament off the dura complete decompression until the dural tube was round bone was removed at the level the pedicles and the removed bone was cleaned of soft tissue and rongeured and the small pieces of bone to be used later in the fusion.  [place]field 4 was placed on the L4-5 level and in the prone position there was partial reduction of the patient's spondylolisthesis.  Dura [place] was used to protect the dura and facet was removed on the right side for entry into the disc space.  The space with meticulously cleaned out of disc material leaving the anterior annulus which was intact.  Curettes rasps ring curettes up-biting black pituitaries straight pituitaries were used until disc was cleaned out endplates were scraped but not violated.  Disc spreader and disc cutter was used and progression from 7 up to a 10 height gave restoration of normal disc space height symmetrical at L5-S1.  10 mm cage was selected and C arm was used to visualize the metal trial to make sure there was good position and good fill of the disc space.  Each trial was removed with a slap happy carefully protecting the nerve root and dura.  Bone that been meticulously cleaned was then packed into the disc space pushed anteriorly and kicked across.  Patient had some asymmetric closure of the disc she was tighter on the right side and has a cage was inserted this made the endplates parallel and  had helped correct curvature on [facility] x-ray.  Cage was initially inserted to the was flush with the posterior cortex just under fluoroscopy and then kicked over as the markers were checked intermittently until the cage was parallel to the floor with  graft anteriorly.  Cage was countersunk 3 to 4 mm inside the disc base and was extremely stable.  Neck sequentially pedicle screws were placed right side first then left side at L4 and L5 all being 6 x 40 screws starting with all checking under fluoroscopy using the joystick pushing it and twisting by hand pedicle feeler tapping pedicle feeler checking under fluoroscopy intermittently decorticating transverse process and then placement of the screw checking under fluoroscopy for each screw.  All screws had convergence all screws were inside bone all screws were down the pedicle.  45 mm rods were selected screws were placed compression on the right side first followed by left side with good compression.  Bone was packed mostly on the left side less on the right which was decided with the facet was removed.  Final spot pictures were taken documenting position of hardware cage and pedicle screws.  Epidural space was carefully protected and checked again.  Some thrombin Gelfoam to been placed in the gutters reparable epidural bleeding and bipolar cautery had been used.  Copious irrigation and standard layered closure #1 Vicryl 2-0 Vicryl and subtenons tissue skin staple closure postop dressing and transferred recovery room in stable condition.

## 2019-02-13 NOTE — Interval H&P Note (Signed)
History and Physical Interval Note:  02/13/2019 12:26 PM  Rachel [name]  has presented today for surgery, with the diagnosis of L4-5 severe stenosis, listhesis.  The various methods of treatment have been discussed with the patient and family. After consideration of risks, benefits and other options for treatment, the patient has consented to  Procedure(s): L4-5 GILL PROCEDURE TRANSFORAMINAL LUMBAR INTERBODY FUSION, PEDICLE INSTRUMENTATION, BILATERAL LATERAL FUSION (N/A) as a surgical intervention.  The patient's history has been reviewed, patient examined, no change in status, stable for surgery.  I have reviewed the patient's chart and labs.  Questions were answered to the patient's satisfaction.     Marybelle Killings

## 2019-02-13 NOTE — Transfer of Care (Signed)
Immediate Anesthesia Transfer of Care Note  Patient: Rachel [name]  Procedure(s) Performed: LUMBAR FOUR-FIVE GILL PROCEDURE TRANSFORAMINAL LUMBAR INTERBODY FUSION, PEDICLE INSTRUMENTATION, BILATERAL LATERAL FUSION (N/A Back)  Patient Location: PACU  Anesthesia Type:General  Level of Consciousness: sedated  Airway & Oxygen Therapy: Patient Spontanous Breathing and Patient connected to nasal cannula oxygen  Post-op Assessment: Report given to RN, Post -op Vital signs reviewed and stable and Patient moving all extremities  Post vital signs: Reviewed and stable  Last Vitals:  Vitals Value Taken Time  BP 167/93 02/13/19 1634  Temp    Pulse 57 02/13/19 1636  Resp 11 02/13/19 1637  SpO2 96 % 02/13/19 1636  Vitals shown include unvalidated device data.  Last Pain:  Vitals:   02/13/19 1139  PainSc: 0-No pain      Patients Stated Pain Goal: 3 ([id number])  Complications: No apparent anesthesia complications

## 2019-02-13 NOTE — Telephone Encounter (Signed)
Call placed to patient. No answer. No VM.  

## 2019-02-13 NOTE — Anesthesia Procedure Notes (Signed)
Procedure Name: Intubation Date/Time: 02/13/2019 12:50 PM Performed by: Lowella Dell, CRNA Pre-anesthesia Checklist: Patient identified, Emergency Drugs available, Suction available and Patient being monitored Patient Re-evaluated:Patient Re-evaluated prior to induction Oxygen Delivery Method: Circle System Utilized Preoxygenation: Pre-oxygenation with 100% oxygen Induction Type: IV induction Ventilation: Mask ventilation without difficulty Laryngoscope Size: Mac and 3 Grade View: Grade II Tube type: Oral Tube size: 7.0 mm Number of attempts: 1 Airway Equipment and Method: Stylet Placement Confirmation: ETT inserted through vocal cords under direct vision,  positive ETCO2 and breath sounds checked- equal and bilateral Secured at: 21 cm Tube secured with: Tape Dental Injury: Teeth and Oropharynx as per pre-operative assessment

## 2019-02-14 LAB — BASIC METABOLIC PANEL
Anion gap: 10 (ref 5–15)
BUN: 10 mg/dL (ref 8–23)
CO2: 23 mmol/L (ref 22–32)
Calcium: 8.6 mg/dL — ABNORMAL LOW (ref 8.9–10.3)
Chloride: 106 mmol/L (ref 98–111)
Creatinine, Ser: 0.97 mg/dL (ref 0.44–1.00)
GFR calc Af Amer: >60 mL/min (ref >60)
GFR calc non Af Amer: 58 mL/min — ABNORMAL LOW (ref >60)
Glucose, Bld: 157 mg/dL — ABNORMAL HIGH (ref 70–99)
Potassium: 4.1 mmol/L (ref 3.5–5.1)
Sodium: 139 mmol/L (ref 135–145)

## 2019-02-14 LAB — CBC
HCT: 30.2 % — ABNORMAL LOW (ref 36.0–46.0)
Hemoglobin: 9.6 g/dL — ABNORMAL LOW (ref 12.0–15.0)
MCH: 29 pg (ref 26.0–34.0)
MCHC: 31.8 g/dL (ref 30.0–36.0)
MCV: 91.2 fL (ref 80.0–100.0)
Platelets: 156 10*3/uL (ref 150–400)
RBC: 3.31 MIL/uL — ABNORMAL LOW (ref 3.87–5.11)
RDW: 14.7 % (ref 11.5–15.5)
WBC: 7.2 10*3/uL (ref 4.0–10.5)
nRBC: 0 % (ref 0.0–0.2)

## 2019-02-14 MED ORDER — ONDANSETRON HCL 4 MG PO TABS: 4.0000 mg | ORAL_TABLET | Freq: Four times a day (QID) | ORAL | Status: DC | PRN

## 2019-02-14 MED ORDER — ONDANSETRON HCL 4 MG/2ML IJ SOLN: 4.0000 mg | Freq: Four times a day (QID) | INTRAMUSCULAR | Status: DC | PRN

## 2019-02-14 MED ORDER — PROMETHAZINE HCL 25 MG/ML IJ SOLN
12.5000 mg | Freq: Four times a day (QID) | INTRAMUSCULAR | Status: DC | PRN
  Administered 2019-02-14: 12.5 mg via INTRAVENOUS
  Filled 2019-02-14: qty 1

## 2019-02-14 NOTE — Progress Notes (Signed)
   Subjective: 1 Day Post-Op Procedure(s) (LRB): LUMBAR FOUR-FIVE GILL PROCEDURE TRANSFORAMINAL LUMBAR INTERBODY FUSION, PEDICLE INSTRUMENTATION, BILATERAL LATERAL FUSION (N/A) Patient reports pain as moderate.    Objective: Vital signs in last 24 hours: Temp:  [97 F (36.1 C)-99.1 F (37.3 C)] 99.1 F (37.3 C) (09/17 0459) Pulse Rate:  [50-81] 76 (09/17 0459) Resp:  [6-20] 16 (09/17 0459) BP: (112-183)/(68-117) 112/73 (09/17 0459) SpO2:  [96 %-100 %] 100 % (09/17 0459) Weight:  [66.5 kg] 66.5 kg (09/16 1139)  Intake/Output from previous day: 09/16 0701 - 09/17 0700 In: 3117.4 [I.V.:2617.4; IV Piggyback:500] Out: [id number] [Urine:975; Emesis/NG output:1; Blood:800] Intake/Output this shift: No intake/output data recorded.  Recent Labs    02/11/19 1515 02/14/19 0325  HGB 13.7 9.6*   Recent Labs    02/11/19 1515 02/14/19 0325  WBC 5.6 7.2  RBC 4.64 3.31*  HCT 41.6 30.2*  PLT 193 156   Recent Labs    02/11/19 1515 02/14/19 0325  NA 140 139  K 3.6 4.1  CL 106 106  CO2 23 23  BUN 11 10  CREATININE 1.03* 0.97  GLUCOSE 100* 157*  CALCIUM 10.1 8.6*   No results for input(s): LABPT, INR in the last 72 hours.  Neurologically intact Dg Lumbar Spine 2-3 Views  Result Date: 02/13/2019 CLINICAL DATA:  L4-5 PLIF. EXAM: LUMBAR SPINE - 2-3 VIEW; DG C-ARM 1-60 MIN COMPARISON:  MRI lumbar spine 07/19/2018 FINDINGS: Examination demonstrates posterior fusion hardware intact at the L4-5 level with interbody fusion at the L4-5 level. There is mild spondylosis of the lumbar spine. Subtle grade 1 anterolisthesis of L4 on L5. IMPRESSION: Evidence of recent posterior spinal fusion at the L4-5 level with hardware intact. Electronically Signed   By: Marin Olp M.D.   On: 02/13/2019 16:28   Dg C-arm 1-60 Min  Result Date: 02/13/2019 CLINICAL DATA:  L4-5 PLIF. EXAM: LUMBAR SPINE - 2-3 VIEW; DG C-ARM 1-60 MIN COMPARISON:  MRI lumbar spine 07/19/2018 FINDINGS: Examination demonstrates  posterior fusion hardware intact at the L4-5 level with interbody fusion at the L4-5 level. There is mild spondylosis of the lumbar spine. Subtle grade 1 anterolisthesis of L4 on L5. IMPRESSION: Evidence of recent posterior spinal fusion at the L4-5 level with hardware intact. Electronically Signed   By: Marin Olp M.D.   On: 02/13/2019 16:28    Assessment/Plan: 1 Day Post-Op Procedure(s) (LRB): LUMBAR FOUR-FIVE GILL PROCEDURE TRANSFORAMINAL LUMBAR INTERBODY FUSION, PEDICLE INSTRUMENTATION, BILATERAL LATERAL FUSION (N/A) Up with therapy  Rachel [name] 02/14/2019, 8:17 AM

## 2019-02-14 NOTE — Telephone Encounter (Signed)
Multiple calls placed to patient with no answer and no return call.   Message to be closed.  

## 2019-02-14 NOTE — Progress Notes (Signed)
PA on call was notified that patient was having vomiting and that Zofran 4 mg IV is not due. New orders received will continue to monitor. Arthor Captain LPN

## 2019-02-14 NOTE — Evaluation (Signed)
Physical Therapy Evaluation Patient Details Name: Rachel [name] MRN: [id number] DOB: 1945/10/11 Today's Date: 02/14/2019   History of Present Illness  73 yo female s/p L4-L5 transforaminal lumbar interbody fusion, pedicle instrumentation, bilateral lateral fusion on 02/13/19. PMH includes osteoporosis, UE neuropathy, anxiety, alopecia, cancer, HTN.    Clinical Impression  Pt presents with severe post-operative pain, generalized weakness, decreased knowledge of back precautions and brace application, difficulty performing mobility tasks, and decreased endurance for activity post-operatively. Pt to benefit from acute PT to address deficits. Pt ambulated short hallway distance this session with multimodal cuing for form and safety post-operatively, pt mostly limited by spasm-like back pain this session. PT recommending HHPT to address pt's mobility deficits and return pt to PLOF. Pt's daughters will be staying with her over the next few weeks to provide her assist as needed. PT to progress mobility as tolerated, and will continue to follow acutely.      Follow Up Recommendations Home health PT;Supervision/Assistance - 24 hour    Equipment Recommendations  Rolling walker with 5" wheels    Recommendations for Other Services       Precautions / Restrictions Precautions Precautions: Fall;Back Precaution Booklet Issued: Yes (comment) Precaution Comments: PT administered, reviewed, demonstrated, and assisted pt in applying back precautions (no bending, no lifting >5 lbs, no twisting spine) during eval. Required Braces or Orthoses: Spinal Brace Spinal Brace: Lumbar corset;Applied in sitting position Restrictions Weight Bearing Restrictions: No      Mobility  Bed Mobility Overal bed mobility: Needs Assistance Bed Mobility: Rolling;Sidelying to Sit Rolling: Min assist Sidelying to sit: Mod assist;+2 for safety/equipment       General bed mobility comments: Min assist for completion of  roll onto R side, verbal cuing for use of R handrail to self-assist. Mod assist +2 for LE lowering off EOB, trunk elevation. Increased time to perform due to pt-reported pain.  Transfers Overall transfer level: Needs assistance Equipment used: Rolling walker (2 wheeled) Transfers: Sit to/from Stand Sit to Stand: Min assist;From elevated surface         General transfer comment: min assist for power up initially, pt with self-steadying upon standing. Verbal cuing for hand placement when rising from stable surface, versus pt preference to pull up on RW. PT/OT reinforcing rising from hips/knees vs back. Sit to stand x2, once from bed and once from toilet.  Ambulation/Gait Ambulation/Gait assistance: Min guard;+2 safety/equipment(chair follow) Gait Distance (Feet): 50 Feet(10 ft to bathroom + 50 ft hallway) Assistive device: Rolling walker (2 wheeled) Gait Pattern/deviations: Step-through pattern;Decreased stride length;Trunk flexed Gait velocity: decr   General Gait Details: min guard for safety, verbal and tactile cuing for upright posture, placement in RW x3 during ambulation. At 50 ft hallway ambulation, pt reporting fatigue and severe back spasms, one mild bilateral LE buckle noted pt corrected. Verbal cuing for slow, eccentric lowering with hips/knees.  Stairs            Wheelchair Mobility    Modified Rankin (Stroke Patients Only)       Balance Overall balance assessment: Needs assistance Sitting-balance support: No upper extremity supported;Feet supported Sitting balance-Leahy Scale: Fair Sitting balance - Comments: able to sit EOB without assist, unable to accept challenge due to severe back pain   Standing balance support: Single extremity supported Standing balance-Leahy Scale: Poor Standing balance comment: reliant on UE support in standing, at least single UE  Pertinent Vitals/Pain Pain Assessment: 0-10 Pain Score: 9   Pain Location: back Pain Descriptors / Indicators: Sore;Spasm Pain Intervention(s): Limited activity within patient's tolerance;Monitored during session;RN gave pain meds during session;Repositioned    Home Living Family/patient expects to be discharged to:: Private residence Living Arrangements: Alone Available Help at Discharge: Family;Available 24 hours/day(2 daughters will be staying with pt, alternating) Type of Home: Apartment Home Access: Level entry     Home Layout: One level Home Equipment: Grab bars - toilet      Prior Function Level of Independence: Independent         Comments: Pt reports doing everything for self, drives.     Hand Dominance   Dominant Hand: Right    Extremity/Trunk Assessment   Upper Extremity Assessment Upper Extremity Assessment: Defer to OT evaluation    Lower Extremity Assessment Lower Extremity Assessment: Generalized weakness    Cervical / Trunk Assessment Cervical / Trunk Assessment: Other exceptions Cervical / Trunk Exceptions: Pt walks in forward flexed position, but flexible and able to correct with postural cuing  Communication   Communication: No difficulties  Cognition Arousal/Alertness: Awake/alert Behavior During Therapy: WFL for tasks assessed/performed Overall Cognitive Status: Within Functional Limits for tasks assessed                                        General Comments      Exercises     Assessment/Plan    PT Assessment Patient needs continued PT services  PT Problem List Decreased strength;Decreased mobility;Decreased safety awareness;Decreased knowledge of precautions;Decreased activity tolerance;Decreased balance;Decreased knowledge of use of DME;Pain       PT Treatment Interventions DME instruction;Therapeutic activities;Gait training;Therapeutic exercise;Patient/family education;Balance training;Functional mobility training    PT Goals (Current goals can be found in the Care  Plan section)  Acute Rehab PT Goals Patient Stated Goal: decrease my back pain PT Goal Formulation: With patient Time For Goal Achievement: 02/28/19 Potential to Achieve Goals: Good    Frequency Min 5X/week   Barriers to discharge        Co-evaluation PT/OT/SLP Co-Evaluation/Treatment: Yes Reason for Co-Treatment: For patient/therapist safety PT goals addressed during session: Mobility/safety with mobility OT goals addressed during session: ADL's and self-care       AM-PAC PT "6 Clicks" Mobility  Outcome Measure Help needed turning from your back to your side while in a flat bed without using bedrails?: A Little Help needed moving from lying on your back to sitting on the side of a flat bed without using bedrails?: A Lot Help needed moving to and from a bed to a chair (including a wheelchair)?: A Little Help needed standing up from a chair using your arms (e.g., wheelchair or bedside chair)?: A Little Help needed to walk in hospital room?: A Little Help needed climbing 3-5 steps with a railing? : A Lot 6 Click Score: 16    End of Session Equipment Utilized During Treatment: Gait belt;Back brace Activity Tolerance: Patient tolerated treatment well;Patient limited by pain Patient left: in chair;with call bell/phone within reach(Pt verbally agrees to press call button and wait for RN staff assistance before mobilizing back to bed) Nurse Communication: Mobility status;Patient requests pain meds PT Visit Diagnosis: Other abnormalities of gait and mobility (R26.89);Pain Pain - Right/Left: (low) Pain - part of body: (back)    Time: [id number] PT Time Calculation (min) (ACUTE ONLY): 26 min   Charges:  PT Evaluation $PT Eval Low Complexity: 1 Low          Julien Girt, PT Acute Rehabilitation Services Pager [phone]  Office [phone]  Roxine Caddy D Elonda Husky 02/14/2019, 2:39 PM

## 2019-02-14 NOTE — Progress Notes (Signed)
Orthopedic Tech Progress Note Patient Details:  Rachel [name] 06/11/1945 [id number] Called in order to [facility] for an [place] Patient ID: CHEREL ROEHRIG, female   DOB: [birth year], 73 y.o.   MRN: [id number]   Janit Pagan 02/14/2019, 8:50 AM

## 2019-02-14 NOTE — Progress Notes (Signed)
Ortho Tech paged that patient has order for [place]. Stated it will be delivered 02/14/2019 am. Arthor Captain LPN

## 2019-02-14 NOTE — Evaluation (Signed)
Occupational Therapy Evaluation Patient Details Name: Rachel [name] MRN: [id number] DOB: 1945/06/23 Today's Date: 02/14/2019    History of Present Illness 73 yo female s/p L4-L5 transforaminal lumbar interbody fusion, pedicle instrumentation, bilateral lateral fusion on 02/13/19. PMH includes osteoporosis, UE neuropathy, anxiety, alopecia, cancer, HTN.   Clinical Impression   Pt was independent prior to admission. Presents with post operative pain, weakness and impaired standing balance. Began educating pt in back precautions and compensatory strategies for ADL. Pt requiring up to moderate assistance for ADL and up to min assist for mobility. Will follow acutely. Do not anticipate pt will require post acute OT.    Follow Up Recommendations  No OT follow up    Equipment Recommendations  3 in 1 bedside commode    Recommendations for Other Services       Precautions / Restrictions Precautions Precautions: Fall;Back Precaution Booklet Issued: Yes (comment) Precaution Comments: reviewed back precautions and reinforced with handout Required Braces or Orthoses: Spinal Brace Spinal Brace: Lumbar corset;Applied in sitting position Restrictions Weight Bearing Restrictions: No      Mobility Bed Mobility Overal bed mobility: Needs Assistance Bed Mobility: Rolling;Sidelying to Sit Rolling: Min assist Sidelying to sit: Mod assist;+2 for safety/equipment       General bed mobility comments: Min assist for completion of roll onto R side, verbal cuing for use of R handrail to self-assist. Mod assist +2 for LE lowering off EOB, trunk elevation. Increased time to perform due to pt-reported pain.  Transfers Overall transfer level: Needs assistance Equipment used: Rolling walker (2 wheeled) Transfers: Sit to/from Stand Sit to Stand: Min assist;From elevated surface         General transfer comment: min assist and increased time from bed, min guard from 3 in 1, cues for technique and  hand placement    Balance Overall balance assessment: Needs assistance Sitting-balance support: No upper extremity supported;Feet supported Sitting balance-Leahy Scale: Fair Sitting balance - Comments: able to sit EOB without assist, unable to accept challenge due to severe back pain   Standing balance support: Single extremity supported Standing balance-Leahy Scale: Poor Standing balance comment: reliant on UE support in standing, at least single UE                           ADL either performed or assessed with clinical judgement   ADL Overall ADL's : Needs assistance/impaired Eating/Feeding: Independent;Sitting   Grooming: Oral care;Wash/dry hands;Wash/dry face;Sitting;Set up Grooming Details (indicate cue type and reason): instructed in 2 cup method for brushing teeth when in standing Upper Body Bathing: Minimal assistance;Sitting   Lower Body Bathing: Moderate assistance;Sit to/from stand   Upper Body Dressing : Sitting;Set up   Lower Body Dressing: Moderate assistance;Sit to/from stand   Toilet Transfer: Min guard;Ambulation;RW;BSC   Toileting- Water quality scientist and Hygiene: Min guard;Sit to/from stand Toileting - Clothing Manipulation Details (indicate cue type and reason): instructed in compensatory technique     Functional mobility during ADLs: Min guard;Rolling walker;Cueing for safety General ADL Comments: Daughters will assist with IADL for first several weeks.     Vision Baseline Vision/History: Wears glasses Patient Visual Report: No change from baseline       Perception     Praxis      Pertinent Vitals/Pain Pain Assessment: 0-10 Pain Score: 9  Pain Location: back Pain Descriptors / Indicators: Sore;Spasm Pain Intervention(s): Monitored during session;Repositioned;Patient requesting pain meds-RN notified     Hand Dominance Right   Extremity/Trunk  Assessment Upper Extremity Assessment Upper Extremity Assessment: Overall WFL for  tasks assessed   Lower Extremity Assessment Lower Extremity Assessment: Defer to PT evaluation   Cervical / Trunk Assessment Cervical / Trunk Assessment: Other exceptions Cervical / Trunk Exceptions: s/p back sx, pt with flexed posture unless cued   Communication Communication Communication: No difficulties   Cognition Arousal/Alertness: Awake/alert Behavior During Therapy: WFL for tasks assessed/performed Overall Cognitive Status: Within Functional Limits for tasks assessed                                     General Comments       Exercises     Shoulder Instructions      Home Living Family/patient expects to be discharged to:: Private residence Living Arrangements: Alone Available Help at Discharge: Family;Available 24 hours/day(daughters will take turns) Type of Home: Apartment Home Access: Level entry     Home Layout: One level     Bathroom Shower/Tub: Occupational psychologist: Handicapped height     Home Equipment: Grab bars - toilet   Additional Comments: pt lives in a handicap accessible apartment      Prior Functioning/Environment Level of Independence: Independent        Comments: Pt reports doing everything for self, drives.        OT Problem List: Decreased strength;Decreased activity tolerance;Impaired balance (sitting and/or standing);Decreased knowledge of use of DME or AE;Decreased knowledge of precautions;Pain      OT Treatment/Interventions: Self-care/ADL training;DME and/or AE instruction;Patient/family education;Balance training    OT Goals(Current goals can be found in the care plan section) Acute Rehab OT Goals Patient Stated Goal: decrease my back pain OT Goal Formulation: With patient Time For Goal Achievement: 02/28/19 Potential to Achieve Goals: Good ADL Goals Pt Will Perform Grooming: with supervision;standing Pt Will Perform Lower Body Bathing: with supervision;with adaptive equipment;sit to/from  stand Pt Will Perform Lower Body Dressing: with supervision;with adaptive equipment;sit to/from stand Pt Will Transfer to Toilet: with supervision;ambulating;bedside commode Pt Will Perform Toileting - Clothing Manipulation and hygiene: with supervision;sit to/from stand Pt Will Perform Tub/Shower Transfer: Shower transfer;with supervision;ambulating;3 in 1;rolling walker Additional ADL Goal #1: Pt will generalize back precautions during ADL and mobility with min verbal cues. Additional ADL Goal #2: Pt will perform bed mobility with supervision using log roll technique.  OT Frequency: Min 2X/week   Barriers to D/C:            Co-evaluation   Reason for Co-Treatment: For patient/therapist safety PT goals addressed during session: Mobility/safety with mobility OT goals addressed during session: ADL's and self-care      AM-PAC OT "6 Clicks" Daily Activity     Outcome Measure Help from another person eating meals?: None Help from another person taking care of personal grooming?: A Little Help from another person toileting, which includes using toliet, bedpan, or urinal?: A Little Help from another person bathing (including washing, rinsing, drying)?: A Lot Help from another person to put on and taking off regular upper body clothing?: A Little Help from another person to put on and taking off regular lower body clothing?: A Lot 6 Click Score: 17   End of Session Equipment Utilized During Treatment: Gait belt;Rolling walker;Back brace Nurse Communication: Mobility status;Patient requests pain meds  Activity Tolerance: Patient tolerated treatment well Patient left: in chair;with call bell/phone within reach  OT Visit Diagnosis: Unsteadiness on feet (R26.81);Other abnormalities of  gait and mobility (R26.89);Pain;Muscle weakness (generalized) (M62.81)                Time: [id number] OT Time Calculation (min): 38 min Charges:  OT General Charges $OT Visit: 1 Visit OT Evaluation $OT  Eval Moderate Complexity: 1 Mod OT Treatments $Self Care/Home Management : 8-22 mins  Nestor Lewandowsky, OTR/L Acute Rehabilitation Services Pager: [phone] Office: [phone] Rachel [name] 02/14/2019, 3:33 PM

## 2019-02-15 ENCOUNTER — Encounter (HOSPITAL_COMMUNITY): Payer: Self-pay | Admitting: Orthopaedic Surgery

## 2019-02-15 MED ORDER — HYDROCODONE-ACETAMINOPHEN 10-325 MG PO TABS: 1.0000 | ORAL_TABLET | Freq: Four times a day (QID) | ORAL | 0 refills | Status: DC | PRN

## 2019-02-15 NOTE — Progress Notes (Signed)
Occupational Therapy Treatment Patient Details Name: Rachel [name] MRN: [id number] DOB: July 04, 1945 Today's Date: 02/15/2019    History of present illness 73 yo female s/p L4-L5 transforaminal lumbar interbody fusion, pedicle instrumentation, bilateral lateral fusion on 02/13/19. PMH includes osteoporosis, UE neuropathy, anxiety, alopecia, cancer, HTN.   OT comments  Pt making steady progress towards OT goals this session. Pt completed seated bathing in chair. MIN A for UB bathing; MOD A for LB bathing. Pt reports pain 8/10 during seated bathing increasing to 9/10 with any movement.Pt attempted to twist around to wash backside. Education on using AE to wash back and feet. Pt complete stand pivot from chair >EOB. MOD A for sit>stand with RW. MIN A for transfer. Education on LB AE for dressing. Pt verbalized understanding. Pts daughter planning to come observe therapies tomorrow (9/19) as she will be the one home with her. DC plan remains appropriate. Will continue to follow for acute OT needs.   Follow Up Recommendations  No OT follow up    Equipment Recommendations  3 in 1 bedside commode    Recommendations for Other Services      Precautions / Restrictions Precautions Precautions: Fall;Back Precaution Booklet Issued: Yes (comment) Precaution Comments: Reviewed back precaution handout with pt Required Braces or Orthoses: Spinal Brace Spinal Brace: Lumbar corset;Applied in sitting position Restrictions Weight Bearing Restrictions: No       Mobility Bed Mobility Overal bed mobility: Needs Assistance Bed Mobility: Rolling;Sidelying to Sit Rolling: Min guard Sidelying to sit: Mod assist       General bed mobility comments: MOD A to get trunk upright with slow transition  Transfers Overall transfer level: Needs assistance Equipment used: Rolling walker (2 wheeled) Transfers: Sit to/from [facility] Sit to Stand: Mod assist Stand pivot transfers: Min assist        General transfer comment: MOD A for intiial power up from recliner; cues to push from chair; MIN A for transfer    Balance Overall balance assessment: Needs assistance Sitting-balance support: No upper extremity supported;Feet supported Sitting balance-Leahy Scale: Fair Sitting balance - Comments: able to sit EOB without assist   Standing balance support: Bilateral upper extremity supported Standing balance-Leahy Scale: Poor Standing balance comment: requires UE support                           ADL either performed or assessed with clinical judgement   ADL Overall ADL's : Needs assistance/impaired         Upper Body Bathing: Minimal assistance;Sitting Upper Body Bathing Details (indicate cue type and reason): d/t pain Lower Body Bathing: Moderate assistance;Sit to/from stand Lower Body Bathing Details (indicate cue type and reason): education provided on using long handled sponge to reach feet for LB bathing Upper Body Dressing : Sitting;Set up;Minimal assistance Upper Body Dressing Details (indicate cue type and reason): donning hospital gown   Lower Body Dressing Details (indicate cue type and reason): education on all LB dressing AE Toilet Transfer: Ambulation;RW;Minimal assistance;Moderate assistance Toilet Transfer Details (indicate cue type and reason): simulated from recliner>EOB: MOD A for sit>stand; increased time and effort         Functional mobility during ADLs: Min guard;Rolling walker;Cueing for safety;Minimal assistance General ADL Comments: limited by pain during ADLs     Vision Baseline Vision/History: Wears glasses Patient Visual Report: No change from baseline     Perception     Praxis      Cognition Arousal/Alertness: Awake/alert Behavior  During Therapy: WFL for tasks assessed/performed Overall Cognitive Status: Within Functional Limits for tasks assessed                                          Exercises      Shoulder Instructions       General Comments pt wanting to wash backside in standing; education on the importance of not twisting and using AE for pericare    Pertinent Vitals/ Pain       Pain Assessment: 0-10 Pain Score: 8  Pain Location: back Pain Descriptors / Indicators: Sore;Spasm;Nagging;Moaning;Grimacing Pain Intervention(s): Limited activity within patient's tolerance;Monitored during session;Repositioned  Home Living                                          Prior Functioning/Environment              Frequency  Min 2X/week        Progress Toward Goals  OT Goals(current goals can now be found in the care plan section)  Progress towards OT goals: Progressing toward goals  Acute Rehab OT Goals Patient Stated Goal: decrease my back pain Time For Goal Achievement: 02/28/19 Potential to Achieve Goals: Good  Plan      Co-evaluation                 AM-PAC OT "6 Clicks" Daily Activity     Outcome Measure   Help from another person eating meals?: None Help from another person taking care of personal grooming?: A Little Help from another person toileting, which includes using toliet, bedpan, or urinal?: A Little Help from another person bathing (including washing, rinsing, drying)?: A Lot Help from another person to put on and taking off regular upper body clothing?: A Little Help from another person to put on and taking off regular lower body clothing?: A Lot 6 Click Score: 17    End of Session Equipment Utilized During Treatment: Rolling walker;Back brace  OT Visit Diagnosis: Unsteadiness on feet (R26.81);Other abnormalities of gait and mobility (R26.89);Pain;Muscle weakness (generalized) (M62.81)   Activity Tolerance Patient limited by pain   Patient Left in bed;with call bell/phone within reach;with nursing/sitter in room   Nurse Communication Mobility status        Time: 1020-1056 OT Time Calculation (min): 36  min  Charges: OT General Charges $OT Visit: 1 Visit OT Treatments $Self Care/Home Management : 23-37 mins  [place], [place] [phone] [place] 02/15/2019, 11:47 AM

## 2019-02-15 NOTE — Discharge Instructions (Signed)
Ok to shower 5 days postop.  Do not apply any creams or ointments to incision.  .  Can use 4x4 gauze and tape for dressing changes.  No aggressive activity.  No bending, twisting, lifting, squatting or prolonged sitting.  Mostly be in reclined position or lying down.  No driving

## 2019-02-15 NOTE — Progress Notes (Signed)
Physical Therapy Treatment Patient Details Name: Rachel [name] MRN: [id number] DOB: 04-18-1946 Today's Date: 02/15/2019    History of Present Illness 73 yo female s/p L4-L5 transforaminal lumbar interbody fusion, pedicle instrumentation, bilateral lateral fusion on 02/13/19. PMH includes osteoporosis, UE neuropathy, anxiety, alopecia, cancer, HTN.    PT Comments    Pt making slow progress and noted weakness in LE with gait.  She required MOD A for bed mobility, MIN A for standing, and MIN/guard for 68' gait with RW and 2 small episodes of buckeling.  Feel she could benefit from another day of PT in acute care/  Recommend HHPT, RW and 3-1 BSC.  Follow Up Recommendations  Home health PT;Supervision/Assistance - 24 hour     Equipment Recommendations  Rolling walker with 5" wheels;3in1 (PT)    Recommendations for Other Services       Precautions / Restrictions Precautions Precautions: Fall;Back Precaution Comments: Reviewed back precaution handout with pt Required Braces or Orthoses: Spinal Brace Spinal Brace: Lumbar corset;Applied in sitting position Restrictions Weight Bearing Restrictions: No    Mobility  Bed Mobility Overal bed mobility: Needs Assistance Bed Mobility: Rolling;Sidelying to Sit Rolling: Min guard Sidelying to sit: Mod assist       General bed mobility comments: MOD A to get trunk upright with slow transition  Transfers Overall transfer level: Needs assistance Equipment used: Rolling walker (2 wheeled) Transfers: Sit to/from Stand Sit to Stand: Min assist         General transfer comment: Stood from recliner and toilet with cues for hand placement.   Ambulation/Gait Ambulation/Gait assistance: Min guard Gait Distance (Feet): 68 Feet Assistive device: Rolling walker (2 wheeled) Gait Pattern/deviations: Step-through pattern;Decreased step length - right;Decreased step length - left;Trunk flexed Gait velocity: decr   General Gait Details: Pt  needing cues for upright posture. Weakness noted in LE and "weak knees" throughout gait with 2 small episodes of buckeling, but able to self correct.   Stairs             Wheelchair Mobility    Modified Rankin (Stroke Patients Only)       Balance     Sitting balance-Leahy Scale: Fair       Standing balance-Leahy Scale: Poor Standing balance comment: requires UE support                            Cognition Arousal/Alertness: Awake/alert Behavior During Therapy: WFL for tasks assessed/performed Overall Cognitive Status: Within Functional Limits for tasks assessed                                        Exercises      General Comments General comments (skin integrity, edema, etc.): Upon entry, pt lying in sidelying, but trunk twisted backwards.  Reviewed back precautions and importance of maintaining.      Pertinent Vitals/Pain Pain Assessment: 0-10 Pain Score: 8  Pain Location: back Pain Descriptors / Indicators: Sore;Spasm Pain Intervention(s): Limited activity within patient's tolerance;Monitored during session;Patient requesting pain meds-RN notified    Home Living                      Prior Function            PT Goals (current goals can now be found in the care plan section) Acute Rehab PT Goals Patient Stated  Goal: decrease my back pain PT Goal Formulation: With patient Potential to Achieve Goals: Good Progress towards PT goals: Progressing toward goals    Frequency    Min 5X/week      PT Plan Current plan remains appropriate    Co-evaluation              AM-PAC PT "6 Clicks" Mobility   Outcome Measure  Help needed turning from your back to your side while in a flat bed without using bedrails?: A Little Help needed moving from lying on your back to sitting on the side of a flat bed without using bedrails?: A Lot Help needed moving to and from a bed to a chair (including a wheelchair)?: A  Little Help needed standing up from a chair using your arms (e.g., wheelchair or bedside chair)?: A Little Help needed to walk in hospital room?: A Little Help needed climbing 3-5 steps with a railing? : A Lot 6 Click Score: 16    End of Session Equipment Utilized During Treatment: Gait belt;Back brace Activity Tolerance: Patient tolerated treatment well;Patient limited by pain Patient left: in chair;with call bell/phone within reach Nurse Communication: Mobility status;Patient requests pain meds PT Visit Diagnosis: Other abnormalities of gait and mobility (R26.89);Pain     Time: [id number] PT Time Calculation (min) (ACUTE ONLY): 29 min  Charges:  $Gait Training: 8-22 mins $Therapeutic Activity: 8-22 mins                     Nate Perri L. Tamala Julian, [place] Pager [phone] 02/15/2019    Galen Manila 02/15/2019, 9:58 AM

## 2019-02-15 NOTE — TOC Initial Note (Addendum)
Transition of Care [facility]) - Initial/Assessment Note    Patient Details  Name: Rachel [name] MRN: [id number] Date of Birth: 03/12/1946  Transition of Care [facility]) CM/SW Contact:    Gelene Mink, [place] Phone Number: 02/15/2019, 3:11 PM  Clinical Narrative:                  CSW met with the patient at bedside. CSW introduced herself and explained her role. CSW shared the therapy recommendation. The patient is in agreement with returning home with home health. The patient does not have a preference of a home health agency. The patient stated she would need a 3-in-1 and rolling walker at home. The patient stated that she has two daughters that will assist her and provide 24/7 supervision at her home.   CSW contacted Tiffany with [facility] and Mateo Flow at [facility] to see if they will be able to accept the patient. Tiffany can take the patient. MD has only recommended PT.    Referral for DME was made to [facility]. The patient will need HH and DME orders placed closer to discharge.    Expected Discharge Plan: [place] Barriers to Discharge: Continued Medical Work up   Patient Goals and CMS Choice Patient states their goals for this hospitalization and ongoing recovery are:: To go home CMS Medicare.gov Compare Post Acute Care list provided to:: Patient Choice offered to / list presented to : Patient  Expected Discharge Plan and Services Expected Discharge Plan: [place] In-house Referral: Clinical Social Work   Post Acute Care Choice: [place] arrangements for the past 2 months: Apartment                 DME Arranged: Gilford Rile rolling, 3-N-1 DME Agency: [facility] Date DME Agency Contacted: 02/15/19 Time DME Agency Contacted: [id number] Representative spoke with at DME Agency: [place]            Prior Living Arrangements/Services Living arrangements for the past 2 months: [place] with:: Self Patient language and need  for interpreter reviewed:: No Do you feel safe going back to the place where you live?: Yes      Need for Family Participation in Patient Care: No (Comment) Care giver support system in place?: Yes (comment)   Criminal Activity/Legal Involvement Pertinent to Current Situation/Hospitalization: No - Comment as needed  Activities of Daily Living      Permission Sought/Granted Permission sought to share information with : Case Manager Permission granted to share information with : Yes, Verbal Permission Granted     Permission granted to share info w AGENCY: All HH Agencies        Emotional Assessment Appearance:: Appears stated age Attitude/Demeanor/Rapport: Engaged Affect (typically observed): Calm Orientation: : Oriented to Self, Oriented to Place, Oriented to  Time, Oriented to Situation Alcohol / Substance Use: Not Applicable Psych Involvement: No (comment)  Admission diagnosis:  L4-5 severe stenosis, listhesis Patient Active Problem List   Diagnosis Date Noted  . Lumbar spinal stenosis 02/13/2019  . Spinal stenosis of lumbar region with neurogenic claudication 08/10/2018  . Foraminal stenosis of lumbar region 08/10/2018  . DDD (degenerative disc disease), lumbar 07/06/2018  . Osteoporosis 04/24/2018  . Encounter for gynecological examination with Papanicolaou smear of cervix 09/08/2015  . Abnormal Pap smear of vagina 10/15/2014  . Acquired trigger finger 09/03/2014  . Constipation 04/04/2014  . VAIN (vaginal intraepithelial neoplasia) 06/20/2013  . Knee pain, right 06/14/2013  .  Allergic rhinitis 08/22/2012  . Neuropathy, arm 02/19/2012  . GERD (gastroesophageal reflux disease) 02/19/2012  . Alopecia 02/19/2012  . Anxiety 10/03/2011  . Tremor 10/03/2011  . Tobacco use 10/03/2011  . OA (osteoarthritis) 08/11/2011  . Cancer ([place]) 08/11/2011  . Essential hypertension, benign 08/09/2011  . Vitamin D deficiency 08/09/2011   PCP:  [name], MD Pharmacy:    [facility] [place], [place] - [place] [address] [place] [address] Phone: [phone] Fax: [phone]     Social Determinants of Health (SDOH) Interventions    Readmission Risk Interventions Readmission Risk Prevention Plan 02/15/2019  Post Dischage Appt Complete  Medication Screening Complete  Transportation Screening Complete  Some recent data might be hidden

## 2019-02-15 NOTE — Anesthesia Postprocedure Evaluation (Signed)
Anesthesia Post Note  Patient: Rachel [name]  Procedure(s) Performed: LUMBAR FOUR-FIVE GILL PROCEDURE TRANSFORAMINAL LUMBAR INTERBODY FUSION, PEDICLE INSTRUMENTATION, BILATERAL LATERAL FUSION (N/A Back)     Patient location during evaluation: PACU Anesthesia Type: General Level of consciousness: awake and alert Pain management: pain level controlled Vital Signs Assessment: post-procedure vital signs reviewed and stable Respiratory status: spontaneous breathing, nonlabored ventilation, respiratory function stable and patient connected to nasal cannula oxygen Cardiovascular status: blood pressure returned to baseline and stable Postop Assessment: no apparent nausea or vomiting Anesthetic complications: no    Last Vitals:  Vitals:   02/14/19 2336 02/15/19 0554  BP: 125/69 (!) 122/59  Pulse: 90 91  Resp:    Temp:  (!) 38 C  SpO2:  96%    Last Pain:  Vitals:   02/15/19 0556  TempSrc:   PainSc: 8                  Adrin Julian

## 2019-02-15 NOTE — Plan of Care (Signed)
  Problem: Education: Goal: Required Educational Video(s) Outcome: Progressing   Problem: Activity: Goal: Risk for activity intolerance will decrease Outcome: Progressing   Problem: Pain Managment: Goal: General experience of comfort will improve Outcome: Progressing   Problem: Safety: Goal: Ability to remain free from injury will improve Outcome: Progressing   

## 2019-02-16 NOTE — Progress Notes (Signed)
Patient ID: Rachel [name], female   DOB: 1945/11/12, 73 y.o.   MRN: [id number] No acute changes.  Did work with therapy yesterday and made some progress with her mobility.  Vitals stable.  Back dressing clean and dry.  Bilateral legs with grossly intact motor function.  Aspen back brace at the bedside.  She states that Dr. [name] plans to let her go this afternoon if she is doing well.  Will put in discharge orders.

## 2019-02-16 NOTE — Progress Notes (Signed)
Physical Therapy Treatment Patient Details Name: Rachel [name] MRN: [id number] DOB: 1946/02/11 Today's Date: 02/16/2019    History of Present Illness 73 yo female s/p L4-L5 transforaminal lumbar interbody fusion, pedicle instrumentation, bilateral lateral fusion on 02/13/19. PMH includes osteoporosis, UE neuropathy, anxiety, alopecia, cancer, HTN.    PT Comments    Pt performed supine and sidleying exercises, reviewed spinal precautions, reviewed brace application and performed gt and functional training.  Pt requires min to min guard assistance for all mobility this am.  This is am improvement from yesterday.  Pt issued HEP to continues exercise program at home.  Pt also issued back precautions hand out.  Pt required tightening of brace and extensive education on application and fit.  Daughter not present during session.  OT to follow up for home training with daughter.     Follow Up Recommendations  Home health PT;Supervision/Assistance - 24 hour     Equipment Recommendations  Rolling walker with 5" wheels;3in1 (PT)    Recommendations for Other Services       Precautions / Restrictions Precautions Precautions: Back;Fall Precaution Booklet Issued: Yes (comment) Precaution Comments: Reviewed back precaution handout with pt Required Braces or Orthoses: Spinal Brace Spinal Brace: Lumbar corset;Applied in sitting position Restrictions Weight Bearing Restrictions: No    Mobility  Bed Mobility Overal bed mobility: Needs Assistance Bed Mobility: Rolling;Sidelying to Sit Rolling: Min guard;Min assist Sidelying to sit: Min guard;HOB elevated       General bed mobility comments: Pt performed rolling to R and L to complete exercises.  Pt performed sidelying to sit with min guard and HOB elevated.  Transfers Overall transfer level: Needs assistance Equipment used: Rolling walker (2 wheeled) Transfers: Sit to/from Stand Sit to Stand: Min guard         General transfer  comment: Cues to scoot forward to edge of seated surface and cues for hand placement.  Pt with flexed knee intially and required increased time to extend B knees.  Ambulation/Gait Ambulation/Gait assistance: Min guard Gait Distance (Feet): 120 Feet Assistive device: Rolling walker (2 wheeled) Gait Pattern/deviations: Step-through pattern;Decreased step length - right;Decreased step length - left;Trunk flexed Gait velocity: decr   General Gait Details: Cues for upper trunk control and to maintain position in RW.  B knee instability noted but no true buckling.   Stairs             Wheelchair Mobility    Modified Rankin (Stroke Patients Only)       Balance Overall balance assessment: Needs assistance   Sitting balance-Leahy Scale: Fair       Standing balance-Leahy Scale: Poor                              Cognition Arousal/Alertness: Awake/alert Behavior During Therapy: WFL for tasks assessed/performed Overall Cognitive Status: Within Functional Limits for tasks assessed                                 General Comments: Not formally assessed but unable to recall spinal precautions.  So may have some memory impairments.      Exercises General Exercises - Lower Extremity Ankle Circles/Pumps: AROM;20 reps;Both;Supine Quad Sets: AROM;Both;10 reps;Supine Other Exercises Other Exercises: abdominal sets 1x10 reps. Other Exercises: B femoral neural glides performed in R and L sidelying 1x10 reps. Other Exercises: B nerve glides in sidlelying with Knee and hip  extension 1x10 reps.    General Comments        Pertinent Vitals/Pain Pain Assessment: 0-10 Pain Score: 4  Pain Descriptors / Indicators: Discomfort Pain Intervention(s): Monitored during session;Repositioned    Home Living                      Prior Function            PT Goals (current goals can now be found in the care plan section) Acute Rehab PT Goals Patient  Stated Goal: decrease my back pain Potential to Achieve Goals: Good    Frequency    Min 5X/week      PT Plan Current plan remains appropriate    Co-evaluation              AM-PAC PT "6 Clicks" Mobility   Outcome Measure  Help needed turning from your back to your side while in a flat bed without using bedrails?: A Little Help needed moving from lying on your back to sitting on the side of a flat bed without using bedrails?: A Little Help needed moving to and from a bed to a chair (including a wheelchair)?: A Little Help needed standing up from a chair using your arms (e.g., wheelchair or bedside chair)?: A Little Help needed to walk in hospital room?: A Little Help needed climbing 3-5 steps with a railing? : A Little 6 Click Score: 18    End of Session Equipment Utilized During Treatment: Gait belt;Back brace Activity Tolerance: Patient tolerated treatment well;Patient limited by pain Patient left: in chair;with call bell/phone within reach Nurse Communication: Mobility status;Patient requests pain meds PT Visit Diagnosis: Other abnormalities of gait and mobility (R26.89);Pain Pain - Right/Left: (low) Pain - part of body: (back)     Time: [id number] PT Time Calculation (min) (ACUTE ONLY): 43 min  Charges:  $Gait Training: 8-22 mins $Therapeutic Exercise: 8-22 mins $Therapeutic Activity: 8-22 mins                     Governor Rooks, PTA Acute Rehabilitation Services Pager [phone] Office [phone]     Rachel [name] 02/16/2019, 11:34 AM

## 2019-02-16 NOTE — Care Management (Signed)
DME orders for RW and 3n1 placed and will be delivered to room prior to d/c.

## 2019-02-16 NOTE — Progress Notes (Signed)
Discharge instructions addressed;pt in stable condition;not in respiratory distress; necessary equipments are already available in the room;pt to be picked up by daughter at the [employer] entrance.

## 2019-02-16 NOTE — Discharge Summary (Signed)
Patient ID: [name] MRN: [id number] DOB/AGE: 06/17/1945 73 y.o.  Admit date: 02/13/2019 Discharge date: 02/16/2019  Admission Diagnoses:  Active Problems:   Spinal stenosis of lumbar region with neurogenic claudication   Foraminal stenosis of lumbar region   Lumbar spinal stenosis   Discharge Diagnoses:  Same  Past Medical History:  Diagnosis Date  . Back pain   . Carpal tunnel syndrome   . Constipation   . Hypertension   . Low grade squamous intraepith lesion on cytologic smear cervix (lgsil)    +hpv, HAD HYSTERECTOMY  . OA (osteoarthritis)     Surgeries: Procedure(s): LUMBAR FOUR-FIVE GILL PROCEDURE TRANSFORAMINAL LUMBAR INTERBODY FUSION, PEDICLE INSTRUMENTATION, BILATERAL LATERAL FUSION on 02/13/2019   Consultants:   Discharged Condition: Improved  Hospital Course: Rachel [name] is an 73 y.o. female who was admitted 02/13/2019 for operative treatment of<principal problem not specified>. Patient has severe unremitting pain that affects sleep, daily activities, and work/hobbies. After pre-op clearance the patient was taken to the operating room on 02/13/2019 and underwent  Procedure(s): LUMBAR FOUR-FIVE GILL PROCEDURE TRANSFORAMINAL LUMBAR INTERBODY FUSION, PEDICLE INSTRUMENTATION, BILATERAL LATERAL FUSION.    Patient was given perioperative antibiotics:  Anti-infectives (From admission, onward)   Start     Dose/Rate Route Frequency Ordered Stop   02/13/19 2200  ceFAZolin (ANCEF) IVPB 1 g/50 mL premix     1 g 100 mL/hr over 30 Minutes Intravenous Every 8 hours 02/13/19 1833 02/14/19 1406   02/13/19 1045  ceFAZolin (ANCEF) IVPB 2g/100 mL premix     2 g 200 mL/hr over 30 Minutes Intravenous On call to O.R. 02/13/19 1044 02/13/19 1313   02/13/19 1000  ceFAZolin (ANCEF) 2-4 GM/100ML-% IVPB    Note to Pharmacy: Rachel [name]   : cabinet override      02/13/19 1000 02/13/19 1258       Patient was given sequential compression devices, early ambulation, and  chemoprophylaxis to prevent DVT.  Patient benefited maximally from hospital stay and there were no complications.    Recent vital signs:  Patient Vitals for the past 24 hrs:  BP Temp Temp src Pulse Resp SpO2  02/16/19 0535 104/86 99.2 F (37.3 C) Oral 79 - 94 %  02/15/19 2056 126/74 99.4 F (37.4 C) Oral 88 - 100 %  02/15/19 1742 - (!) 101.4 F (38.6 C) Oral - - -  02/15/19 1428 133/83 100.3 F (37.9 C) Oral 90 16 93 %  02/15/19 0841 122/80 98.6 F (37 C) Oral 73 16 99 %     Recent laboratory studies:  Recent Labs    02/14/19 0325  WBC 7.2  HGB 9.6*  HCT 30.2*  PLT 156  NA 139  K 4.1  CL 106  CO2 23  BUN 10  CREATININE 0.97  GLUCOSE 157*  CALCIUM 8.6*     Discharge Medications:   Allergies as of 02/16/2019   No Known Allergies     Medication List    STOP taking these medications   multivitamin with minerals Tabs tablet   naproxen sodium 220 MG tablet Commonly known as: ALEVE   OVER THE COUNTER MEDICATION     TAKE these medications   docusate sodium 100 MG capsule Commonly known as: COLACE TAKE 1 CAPSULE BY MOUTH EVERY 12 HOURS What changed: See the new instructions.   HYDROcodone-acetaminophen 10-325 MG tablet Commonly known as: Norco Take 1 tablet by mouth every 6 (six) hours as needed.   ketorolac 0.5 % ophthalmic solution Commonly known as: ACULAR  Place 1 drop into the left eye 4 (four) times daily.   magnesium citrate solution Take 74-148 mLs by mouth daily as needed (constipation).   pantoprazole 40 MG tablet Commonly known as: PROTONIX Take 1 tablet (40 mg total) by mouth daily. What changed: when to take this   polyethylene glycol powder 17 GM/SCOOP powder Commonly known as: GLYCOLAX/MIRALAX Take 17 g by mouth 2 (two) times daily as needed. What changed:   how much to take  when to take this   prednisoLONE acetate 1 % ophthalmic suspension Commonly known as: PRED FORTE Place 1 drop into the left eye 2 (two) times daily.    propranolol 20 MG tablet Commonly known as: INDERAL Take 1 tablet (20 mg total) by mouth 2 (two) times daily.       Diagnostic Studies: Dg Chest 2 View  Result Date: 02/11/2019 CLINICAL DATA:  Preop, lumbar spine surgery EXAM: CHEST - 2 VIEW COMPARISON:  None. FINDINGS: The heart size and mediastinal contours are within normal limits. Both lungs are clear. Degenerative changes of the spine. IMPRESSION: No active cardiopulmonary disease. Electronically Signed   By: Donavan Foil M.D.   On: 02/11/2019 15:32   Dg Lumbar Spine 2-3 Views  Result Date: 02/13/2019 CLINICAL DATA:  L4-5 PLIF. EXAM: LUMBAR SPINE - 2-3 VIEW; DG C-ARM 1-60 MIN COMPARISON:  MRI lumbar spine 07/19/2018 FINDINGS: Examination demonstrates posterior fusion hardware intact at the L4-5 level with interbody fusion at the L4-5 level. There is mild spondylosis of the lumbar spine. Subtle grade 1 anterolisthesis of L4 on L5. IMPRESSION: Evidence of recent posterior spinal fusion at the L4-5 level with hardware intact. Electronically Signed   By: Marin Olp M.D.   On: 02/13/2019 16:28   Dg C-arm 1-60 Min  Result Date: 02/13/2019 CLINICAL DATA:  L4-5 PLIF. EXAM: LUMBAR SPINE - 2-3 VIEW; DG C-ARM 1-60 MIN COMPARISON:  MRI lumbar spine 07/19/2018 FINDINGS: Examination demonstrates posterior fusion hardware intact at the L4-5 level with interbody fusion at the L4-5 level. There is mild spondylosis of the lumbar spine. Subtle grade 1 anterolisthesis of L4 on L5. IMPRESSION: Evidence of recent posterior spinal fusion at the L4-5 level with hardware intact. Electronically Signed   By: Marin Olp M.D.   On: 02/13/2019 16:28    Disposition: Discharge disposition: 01-Home or Self Care         Follow-up Information    Marybelle Killings, MD. Schedule an appointment as soon as possible for a visit today.   Specialty: Orthopedic Surgery Why: need return office visit 2 weeks postop Contact information: [place] [address] [phone]            Signed: Mcarthur Rossetti 02/16/2019, 8:07 AM

## 2019-02-16 NOTE — Progress Notes (Signed)
Occupational Therapy Treatment Patient Details Name: Rachel [name] MRN: [id number] DOB: [birth year] Today's Date: 02/16/2019    History of present illness 73 yo female s/p L4-L5 transforaminal lumbar interbody fusion, pedicle instrumentation, bilateral lateral fusion on 02/13/19. PMH includes osteoporosis, UE neuropathy, anxiety, alopecia, cancer, HTN.   OT comments  Family educated on back precautions during ADL session. Pt requiring multiple VC and tactile cues to adhere to precautions. Pt lived independently PTA, recommend updating DC recommendations to [facility].   Follow Up Recommendations  Home health OT;Supervision/Assistance - 24 hour    Equipment Recommendations  3 in 1 bedside commode    Recommendations for Other Services      Precautions / Restrictions Precautions Precautions: Back;Fall Precaution Booklet Issued: Yes (comment) Precaution Comments: Reviewed back precaution handout with pt Required Braces or Orthoses: Spinal Brace Spinal Brace: Lumbar corset;Applied in sitting position Restrictions Weight Bearing Restrictions: No       Mobility Bed Mobility Overal bed mobility: Needs Assistance Bed Mobility: Sidelying to Sit Rolling: Min guard;Min assist Sidelying to sit: Min assist       General bed mobility comments: daughter educated on technique  Transfers Overall transfer level: Needs assistance Equipment used: Rolling walker (2 wheeled) Transfers: Sit to/from [employer] to Stand: Min assist Stand pivot transfers: Min guard       General transfer comment: Cues to scoot forward to edge of seated surface and cues for hand placement.  Pt with flexed knee intially and required increased time to extend B knees.    Balance Overall balance assessment: Needs assistance   Sitting balance-Leahy Scale: Good       Standing balance-Leahy Scale: Fair                             ADL either performed or assessed with clinical  judgement   ADL                            Educated on walk in shower transfer technique. Recommend pt use a 3in1 as shower chair. Appropriately adjusted for height. Pt had difficulty reaching bottom for pericare - demonstrated use of toilet aid to help with hygiene.            Functional mobility during ADLs: Min guard;Rolling walker;Cueing for safety General ADL Comments: Educated daughter on managment of ADL adn back precautions during ADL session. Pt able to complete figure four position for LB ADL. Pt requires multiple VC to adhere to precautions. Educated on fall prevention and home set up to maximize independence with ADL.      Vision       Perception     Praxis      Cognition Arousal/Alertness: Awake/alert Behavior During Therapy: WFL for tasks assessed/performed Overall Cognitive Status: Within Functional Limits for tasks assessed                                 General Comments: slow processing; difficulty with STM; multiple cues; most likely baseline        Exercises   Shoulder Instructions       General Comments      Pertinent Vitals/ Pain       Pain Assessment: Faces Pain Score: 4  Faces Pain Scale: Hurts little more Pain Location: back Pain Descriptors / Indicators: Discomfort Pain Intervention(s): Limited  activity within patient's tolerance  Home Living                                          Prior Functioning/Environment              Frequency  Min 2X/week        Progress Toward Goals  OT Goals(current goals can now be found in the care plan section)  Progress towards OT goals: Progressing toward goals  Acute Rehab OT Goals Patient Stated Goal: decrease my back pain OT Goal Formulation: With patient Time For Goal Achievement: 02/28/19 Potential to Achieve Goals: Good ADL Goals Pt Will Perform Grooming: with supervision;standing Pt Will Perform Lower Body Bathing: with  supervision;with adaptive equipment;sit to/from stand Pt Will Perform Lower Body Dressing: with supervision;with adaptive equipment;sit to/from stand Pt Will Transfer to Toilet: with supervision;ambulating;bedside commode Pt Will Perform Toileting - Clothing Manipulation and hygiene: with supervision;sit to/from stand Pt Will Perform Tub/Shower Transfer: Shower transfer;with supervision;ambulating;3 in 1;rolling walker Additional ADL Goal #1: Pt will generalize back precautions during ADL and mobility with min verbal cues. Additional ADL Goal #2: Pt will perform bed mobility with supervision using log roll technique.  Plan Discharge plan remains appropriate    Co-evaluation                 AM-PAC OT "6 Clicks" Daily Activity     Outcome Measure   Help from another person eating meals?: None Help from another person taking care of personal grooming?: A Little Help from another person toileting, which includes using toliet, bedpan, or urinal?: A Little Help from another person bathing (including washing, rinsing, drying)?: A Little Help from another person to put on and taking off regular upper body clothing?: A Little Help from another person to put on and taking off regular lower body clothing?: A Little 6 Click Score: 19    End of Session Equipment Utilized During Treatment: Gait belt;Rolling walker;Back brace  OT Visit Diagnosis: Unsteadiness on feet (R26.81);Other abnormalities of gait and mobility (R26.89);Pain;Muscle weakness (generalized) (M62.81) Pain - part of body: (back)   Activity Tolerance Patient tolerated treatment well   Patient Left in chair;with call bell/phone within reach;with family/visitor present   Nurse Communication Mobility status;Other (comment)(ready for DC)        Time: 1130-1200 OT Time Calculation (min): 30 min  Charges: OT General Charges $OT Visit: 1 Visit OT Treatments $Self Care/Home Management : 23-37 mins  Maurie Boettcher, OT/L    Acute OT Clinical Specialist [place] Pager [phone] Office [phone]    [facility] 02/16/2019, 2:58 PM

## 2019-02-16 NOTE — Plan of Care (Signed)
POC reviewed

## 2019-02-16 NOTE — Progress Notes (Signed)
Pt rested quietly t/o the night with no c/o pain or nausea until around 0330. Pain woke her up. Tylenol and oxycodone given, VSS, call light within reach

## 2019-02-17 DIAGNOSIS — M810 Age-related osteoporosis without current pathological fracture: Secondary | ICD-10-CM | POA: Diagnosis not present

## 2019-02-17 DIAGNOSIS — I10 Essential (primary) hypertension: Secondary | ICD-10-CM | POA: Diagnosis not present

## 2019-02-17 DIAGNOSIS — K219 Gastro-esophageal reflux disease without esophagitis: Secondary | ICD-10-CM | POA: Diagnosis not present

## 2019-02-17 DIAGNOSIS — G629 Polyneuropathy, unspecified: Secondary | ICD-10-CM | POA: Diagnosis not present

## 2019-02-17 DIAGNOSIS — M1991 Primary osteoarthritis, unspecified site: Secondary | ICD-10-CM | POA: Diagnosis not present

## 2019-02-17 DIAGNOSIS — M5136 Other intervertebral disc degeneration, lumbar region: Secondary | ICD-10-CM | POA: Diagnosis not present

## 2019-02-17 DIAGNOSIS — Z4789 Encounter for other orthopedic aftercare: Secondary | ICD-10-CM | POA: Diagnosis not present

## 2019-02-17 DIAGNOSIS — Z87891 Personal history of nicotine dependence: Secondary | ICD-10-CM | POA: Diagnosis not present

## 2019-02-17 DIAGNOSIS — Z981 Arthrodesis status: Secondary | ICD-10-CM | POA: Diagnosis not present

## 2019-02-17 DIAGNOSIS — Z9181 History of falling: Secondary | ICD-10-CM | POA: Diagnosis not present

## 2019-02-17 DIAGNOSIS — M48062 Spinal stenosis, lumbar region with neurogenic claudication: Secondary | ICD-10-CM | POA: Diagnosis not present

## 2019-02-18 ENCOUNTER — Telehealth: Payer: Self-pay | Admitting: Orthopaedic Surgery

## 2019-02-18 NOTE — Telephone Encounter (Signed)
Verdis Frederickson with [facility] called in requesting verbal order for home health pt  for 3 times a week for 1 week and 2 times a week for 3 weeks.  Also needs a verbal order for occupation therapy to evaluate and treat.   [phone]

## 2019-02-18 NOTE — Telephone Encounter (Signed)
I called Rachel [name] and advised. 

## 2019-02-18 NOTE — Telephone Encounter (Signed)
Ucall. OK thanks

## 2019-02-18 NOTE — Telephone Encounter (Signed)
Ok for orders? 

## 2019-02-21 ENCOUNTER — Encounter: Payer: Self-pay | Admitting: Orthopaedic Surgery

## 2019-02-21 ENCOUNTER — Other Ambulatory Visit: Payer: Self-pay

## 2019-02-21 ENCOUNTER — Ambulatory Visit: Payer: Self-pay

## 2019-02-21 ENCOUNTER — Ambulatory Visit (INDEPENDENT_AMBULATORY_CARE_PROVIDER_SITE_OTHER): Payer: Medicare Other | Admitting: Orthopaedic Surgery

## 2019-02-21 VITALS — BP 174/101 | HR 56 | Ht 64.5 in | Wt 146.0 lb

## 2019-02-21 DIAGNOSIS — Z981 Arthrodesis status: Secondary | ICD-10-CM | POA: Diagnosis not present

## 2019-02-21 DIAGNOSIS — M545 Low back pain: Secondary | ICD-10-CM

## 2019-02-21 NOTE — Progress Notes (Signed)
Patient returns she is cutting back on her pain medication.  She is walking daily.  She can progress from the walker to a cane when she is doing better.  Improvement in her leg pain symptoms.  Staples are intact she will return in 1 week for staple removal.  Dressing was changed today.  Return 8 days for nurse visit only for staple removal and Steri-Strip application.  She can follow-up with me in 8 weeks at the [facility] clinic.

## 2019-02-22 DIAGNOSIS — M48062 Spinal stenosis, lumbar region with neurogenic claudication: Secondary | ICD-10-CM | POA: Diagnosis not present

## 2019-02-22 DIAGNOSIS — G629 Polyneuropathy, unspecified: Secondary | ICD-10-CM | POA: Diagnosis not present

## 2019-02-22 DIAGNOSIS — Z4789 Encounter for other orthopedic aftercare: Secondary | ICD-10-CM | POA: Diagnosis not present

## 2019-02-22 DIAGNOSIS — Z981 Arthrodesis status: Secondary | ICD-10-CM | POA: Diagnosis not present

## 2019-02-22 DIAGNOSIS — Z87891 Personal history of nicotine dependence: Secondary | ICD-10-CM | POA: Diagnosis not present

## 2019-02-22 DIAGNOSIS — Z9181 History of falling: Secondary | ICD-10-CM | POA: Diagnosis not present

## 2019-02-22 DIAGNOSIS — M810 Age-related osteoporosis without current pathological fracture: Secondary | ICD-10-CM | POA: Diagnosis not present

## 2019-02-22 DIAGNOSIS — K219 Gastro-esophageal reflux disease without esophagitis: Secondary | ICD-10-CM | POA: Diagnosis not present

## 2019-02-22 DIAGNOSIS — I10 Essential (primary) hypertension: Secondary | ICD-10-CM | POA: Diagnosis not present

## 2019-02-22 DIAGNOSIS — M5136 Other intervertebral disc degeneration, lumbar region: Secondary | ICD-10-CM | POA: Diagnosis not present

## 2019-02-22 DIAGNOSIS — M1991 Primary osteoarthritis, unspecified site: Secondary | ICD-10-CM | POA: Diagnosis not present

## 2019-02-25 ENCOUNTER — Telehealth: Payer: Self-pay

## 2019-02-25 NOTE — Telephone Encounter (Signed)
If she has that much swelling she should go to an urgent care for evaluation.

## 2019-02-25 NOTE — Telephone Encounter (Signed)
IC s/w patient and advised. She verbalized understanding. She reported that some of the swelling had gone down because she had elevated legs

## 2019-02-25 NOTE — Telephone Encounter (Signed)
Can you please advise? Thanks.

## 2019-02-25 NOTE — Telephone Encounter (Signed)
Patient called stating that she is having swelling in her ankles and feet.  Patient had surgery with Dr. [name] on 02/13/2019.  Would like to know what to do about the swelling?  Cb# is [phone].  Please advise.  Thank you.

## 2019-02-26 ENCOUNTER — Telehealth: Payer: Self-pay | Admitting: *Deleted

## 2019-02-26 NOTE — Telephone Encounter (Signed)
Received call from [place], [facility] PT with [facility]. [phone]~ telephone.   Reports that patient has bed bug infestation at this time. States that per patient, exterminator is to come to home on Friday, 03/01/2019.  Reports that they will not be able to provide PT services until proof of extermination has been obtained.   MD made aware.

## 2019-03-04 ENCOUNTER — Other Ambulatory Visit: Payer: Self-pay | Admitting: Family Medicine

## 2019-03-04 DIAGNOSIS — K5900 Constipation, unspecified: Secondary | ICD-10-CM

## 2019-03-04 DIAGNOSIS — R251 Tremor, unspecified: Secondary | ICD-10-CM

## 2019-03-04 DIAGNOSIS — K219 Gastro-esophageal reflux disease without esophagitis: Secondary | ICD-10-CM

## 2019-03-04 DIAGNOSIS — Z23 Encounter for immunization: Secondary | ICD-10-CM

## 2019-03-04 DIAGNOSIS — I10 Essential (primary) hypertension: Secondary | ICD-10-CM

## 2019-03-06 DIAGNOSIS — I10 Essential (primary) hypertension: Secondary | ICD-10-CM | POA: Diagnosis not present

## 2019-03-06 DIAGNOSIS — M1991 Primary osteoarthritis, unspecified site: Secondary | ICD-10-CM | POA: Diagnosis not present

## 2019-03-06 DIAGNOSIS — K219 Gastro-esophageal reflux disease without esophagitis: Secondary | ICD-10-CM | POA: Diagnosis not present

## 2019-03-06 DIAGNOSIS — Z9181 History of falling: Secondary | ICD-10-CM | POA: Diagnosis not present

## 2019-03-06 DIAGNOSIS — M5136 Other intervertebral disc degeneration, lumbar region: Secondary | ICD-10-CM | POA: Diagnosis not present

## 2019-03-06 DIAGNOSIS — Z87891 Personal history of nicotine dependence: Secondary | ICD-10-CM | POA: Diagnosis not present

## 2019-03-06 DIAGNOSIS — Z981 Arthrodesis status: Secondary | ICD-10-CM | POA: Diagnosis not present

## 2019-03-06 DIAGNOSIS — M810 Age-related osteoporosis without current pathological fracture: Secondary | ICD-10-CM | POA: Diagnosis not present

## 2019-03-06 DIAGNOSIS — Z4789 Encounter for other orthopedic aftercare: Secondary | ICD-10-CM | POA: Diagnosis not present

## 2019-03-06 DIAGNOSIS — G629 Polyneuropathy, unspecified: Secondary | ICD-10-CM | POA: Diagnosis not present

## 2019-03-06 DIAGNOSIS — M48062 Spinal stenosis, lumbar region with neurogenic claudication: Secondary | ICD-10-CM | POA: Diagnosis not present

## 2019-03-08 ENCOUNTER — Telehealth: Payer: Self-pay | Admitting: Orthopaedic Surgery

## 2019-03-08 DIAGNOSIS — G629 Polyneuropathy, unspecified: Secondary | ICD-10-CM | POA: Diagnosis not present

## 2019-03-08 DIAGNOSIS — K219 Gastro-esophageal reflux disease without esophagitis: Secondary | ICD-10-CM | POA: Diagnosis not present

## 2019-03-08 DIAGNOSIS — Z4789 Encounter for other orthopedic aftercare: Secondary | ICD-10-CM | POA: Diagnosis not present

## 2019-03-08 DIAGNOSIS — Z981 Arthrodesis status: Secondary | ICD-10-CM | POA: Diagnosis not present

## 2019-03-08 DIAGNOSIS — M5136 Other intervertebral disc degeneration, lumbar region: Secondary | ICD-10-CM | POA: Diagnosis not present

## 2019-03-08 DIAGNOSIS — Z87891 Personal history of nicotine dependence: Secondary | ICD-10-CM | POA: Diagnosis not present

## 2019-03-08 DIAGNOSIS — M810 Age-related osteoporosis without current pathological fracture: Secondary | ICD-10-CM | POA: Diagnosis not present

## 2019-03-08 DIAGNOSIS — M48062 Spinal stenosis, lumbar region with neurogenic claudication: Secondary | ICD-10-CM | POA: Diagnosis not present

## 2019-03-08 DIAGNOSIS — M1991 Primary osteoarthritis, unspecified site: Secondary | ICD-10-CM | POA: Diagnosis not present

## 2019-03-08 DIAGNOSIS — I10 Essential (primary) hypertension: Secondary | ICD-10-CM | POA: Diagnosis not present

## 2019-03-08 DIAGNOSIS — Z9181 History of falling: Secondary | ICD-10-CM | POA: Diagnosis not present

## 2019-03-08 MED ORDER — HYDROCODONE-ACETAMINOPHEN 10-325 MG PO TABS: 1.0000 | ORAL_TABLET | Freq: Three times a day (TID) | ORAL | 0 refills | Status: DC | PRN

## 2019-03-08 NOTE — Telephone Encounter (Signed)
Patient notified

## 2019-03-08 NOTE — Telephone Encounter (Signed)
Rachel [name] from [facility] would like verbal orders for OT. 1x wk 1 wk, 2x 1wk. She also would like to know if it is ok to start upper extremity program with light resistance. Her call back number is [phone]

## 2019-03-08 NOTE — Telephone Encounter (Signed)
Advised for verbal orders.

## 2019-03-08 NOTE — Telephone Encounter (Signed)
Please advise 

## 2019-03-08 NOTE — Telephone Encounter (Signed)
Patient called requesting an RX refill on her hydrocodone.  Patient has one pill left.  She stated that she was in a lot of pain yesterday.  CB#[phone].  Thank you.

## 2019-03-08 NOTE — Telephone Encounter (Signed)
Rx sent 

## 2019-03-12 DIAGNOSIS — I10 Essential (primary) hypertension: Secondary | ICD-10-CM | POA: Diagnosis not present

## 2019-03-12 DIAGNOSIS — M48062 Spinal stenosis, lumbar region with neurogenic claudication: Secondary | ICD-10-CM | POA: Diagnosis not present

## 2019-03-12 DIAGNOSIS — Z4789 Encounter for other orthopedic aftercare: Secondary | ICD-10-CM | POA: Diagnosis not present

## 2019-03-12 DIAGNOSIS — M1991 Primary osteoarthritis, unspecified site: Secondary | ICD-10-CM | POA: Diagnosis not present

## 2019-03-12 DIAGNOSIS — G629 Polyneuropathy, unspecified: Secondary | ICD-10-CM | POA: Diagnosis not present

## 2019-03-12 DIAGNOSIS — M5136 Other intervertebral disc degeneration, lumbar region: Secondary | ICD-10-CM | POA: Diagnosis not present

## 2019-03-12 DIAGNOSIS — K219 Gastro-esophageal reflux disease without esophagitis: Secondary | ICD-10-CM | POA: Diagnosis not present

## 2019-03-12 DIAGNOSIS — M810 Age-related osteoporosis without current pathological fracture: Secondary | ICD-10-CM | POA: Diagnosis not present

## 2019-03-12 DIAGNOSIS — Z9181 History of falling: Secondary | ICD-10-CM | POA: Diagnosis not present

## 2019-03-12 DIAGNOSIS — Z981 Arthrodesis status: Secondary | ICD-10-CM | POA: Diagnosis not present

## 2019-03-12 DIAGNOSIS — Z87891 Personal history of nicotine dependence: Secondary | ICD-10-CM | POA: Diagnosis not present

## 2019-03-14 ENCOUNTER — Telehealth: Payer: Self-pay | Admitting: Orthopaedic Surgery

## 2019-03-14 NOTE — Telephone Encounter (Signed)
Malorie from [facility] left a message stating that the patient refused her [facility] PT due to a doctor's appointment.  CB#[phone].  Thank you.

## 2019-03-14 NOTE — Telephone Encounter (Signed)
FYI

## 2019-03-15 ENCOUNTER — Telehealth: Payer: Self-pay | Admitting: Family Medicine

## 2019-03-15 ENCOUNTER — Telehealth: Payer: Self-pay | Admitting: Orthopaedic Surgery

## 2019-03-15 DIAGNOSIS — I10 Essential (primary) hypertension: Secondary | ICD-10-CM

## 2019-03-15 MED ORDER — LISINOPRIL 20 MG PO TABS: 20.0000 mg | ORAL_TABLET | Freq: Every day | ORAL | 1 refills | Status: DC

## 2019-03-15 NOTE — Telephone Encounter (Signed)
Spoke with patient and informed her of Dr. Dorian Heckle recommendations. Patient verbalized understanding. Medication to be sent to local pharmacy.

## 2019-03-15 NOTE — Telephone Encounter (Signed)
Malori, OT, called. Says patient's BP was up. Could not do OT today. She would like orders to extend her OT for 2x 1wk. Her call back number is [phone]

## 2019-03-15 NOTE — Telephone Encounter (Signed)
Patient's Home Health Physical therapist called and stated that patient's BP today is 170/120. Patient is currently asymptomatic and reluctant to going to ER due to no current symptoms . Denies headache, sob, muscle weakness and chest pain. Please advise?

## 2019-03-15 NOTE — Telephone Encounter (Signed)
Start lisinopril 20mg  once a day for blood pressure  Keep taking the propranolol Check BP at home once a day  Call us with readings Next Tuesday or Wed  Advise not treating can lead to heart attack or stroke

## 2019-03-15 NOTE — Telephone Encounter (Signed)
I called and gave verbal orders.

## 2019-03-19 ENCOUNTER — Other Ambulatory Visit: Payer: Self-pay | Admitting: *Deleted

## 2019-03-19 DIAGNOSIS — K5900 Constipation, unspecified: Secondary | ICD-10-CM

## 2019-03-19 DIAGNOSIS — R251 Tremor, unspecified: Secondary | ICD-10-CM

## 2019-03-19 DIAGNOSIS — K219 Gastro-esophageal reflux disease without esophagitis: Secondary | ICD-10-CM

## 2019-03-19 DIAGNOSIS — Z23 Encounter for immunization: Secondary | ICD-10-CM

## 2019-03-19 DIAGNOSIS — I10 Essential (primary) hypertension: Secondary | ICD-10-CM

## 2019-03-19 MED ORDER — PROPRANOLOL HCL 20 MG PO TABS: ORAL_TABLET | ORAL | 3 refills | Status: DC

## 2019-03-19 MED ORDER — PANTOPRAZOLE SODIUM 40 MG PO TBEC: 40.0000 mg | DELAYED_RELEASE_TABLET | Freq: Every day | ORAL | 3 refills | Status: DC

## 2019-03-20 DIAGNOSIS — Z9181 History of falling: Secondary | ICD-10-CM | POA: Diagnosis not present

## 2019-03-20 DIAGNOSIS — M810 Age-related osteoporosis without current pathological fracture: Secondary | ICD-10-CM | POA: Diagnosis not present

## 2019-03-20 DIAGNOSIS — M5136 Other intervertebral disc degeneration, lumbar region: Secondary | ICD-10-CM | POA: Diagnosis not present

## 2019-03-20 DIAGNOSIS — K219 Gastro-esophageal reflux disease without esophagitis: Secondary | ICD-10-CM | POA: Diagnosis not present

## 2019-03-20 DIAGNOSIS — I10 Essential (primary) hypertension: Secondary | ICD-10-CM | POA: Diagnosis not present

## 2019-03-20 DIAGNOSIS — M48062 Spinal stenosis, lumbar region with neurogenic claudication: Secondary | ICD-10-CM | POA: Diagnosis not present

## 2019-03-20 DIAGNOSIS — M1991 Primary osteoarthritis, unspecified site: Secondary | ICD-10-CM | POA: Diagnosis not present

## 2019-03-20 DIAGNOSIS — Z981 Arthrodesis status: Secondary | ICD-10-CM | POA: Diagnosis not present

## 2019-03-20 DIAGNOSIS — G629 Polyneuropathy, unspecified: Secondary | ICD-10-CM | POA: Diagnosis not present

## 2019-03-20 DIAGNOSIS — Z87891 Personal history of nicotine dependence: Secondary | ICD-10-CM | POA: Diagnosis not present

## 2019-03-20 DIAGNOSIS — Z4789 Encounter for other orthopedic aftercare: Secondary | ICD-10-CM | POA: Diagnosis not present

## 2019-03-22 DIAGNOSIS — M5136 Other intervertebral disc degeneration, lumbar region: Secondary | ICD-10-CM | POA: Diagnosis not present

## 2019-03-22 DIAGNOSIS — M1991 Primary osteoarthritis, unspecified site: Secondary | ICD-10-CM | POA: Diagnosis not present

## 2019-03-22 DIAGNOSIS — Z4789 Encounter for other orthopedic aftercare: Secondary | ICD-10-CM | POA: Diagnosis not present

## 2019-03-22 DIAGNOSIS — Z981 Arthrodesis status: Secondary | ICD-10-CM | POA: Diagnosis not present

## 2019-03-22 DIAGNOSIS — M48062 Spinal stenosis, lumbar region with neurogenic claudication: Secondary | ICD-10-CM | POA: Diagnosis not present

## 2019-03-22 DIAGNOSIS — Z87891 Personal history of nicotine dependence: Secondary | ICD-10-CM | POA: Diagnosis not present

## 2019-03-22 DIAGNOSIS — Z9181 History of falling: Secondary | ICD-10-CM | POA: Diagnosis not present

## 2019-03-22 DIAGNOSIS — I10 Essential (primary) hypertension: Secondary | ICD-10-CM | POA: Diagnosis not present

## 2019-03-22 DIAGNOSIS — G629 Polyneuropathy, unspecified: Secondary | ICD-10-CM | POA: Diagnosis not present

## 2019-03-22 DIAGNOSIS — K219 Gastro-esophageal reflux disease without esophagitis: Secondary | ICD-10-CM | POA: Diagnosis not present

## 2019-03-22 DIAGNOSIS — M810 Age-related osteoporosis without current pathological fracture: Secondary | ICD-10-CM | POA: Diagnosis not present

## 2019-04-11 ENCOUNTER — Ambulatory Visit: Payer: Medicare Other | Admitting: Orthopaedic Surgery

## 2019-04-11 ENCOUNTER — Telehealth: Payer: Self-pay | Admitting: Radiology

## 2019-04-11 NOTE — Telephone Encounter (Signed)
Patient called and cancelled her appointment today due to being sick. She would like to know if you would call her to answer a few questions.  CB [phone]

## 2019-05-02 ENCOUNTER — Ambulatory Visit (INDEPENDENT_AMBULATORY_CARE_PROVIDER_SITE_OTHER): Payer: Medicare Other

## 2019-05-02 ENCOUNTER — Other Ambulatory Visit: Payer: Self-pay

## 2019-05-02 ENCOUNTER — Encounter: Payer: Self-pay | Admitting: Orthopaedic Surgery

## 2019-05-02 ENCOUNTER — Ambulatory Visit (INDEPENDENT_AMBULATORY_CARE_PROVIDER_SITE_OTHER): Payer: Medicare Other | Admitting: Orthopaedic Surgery

## 2019-05-02 VITALS — BP 167/102 | HR 65 | Ht 64.5 in | Wt 146.0 lb

## 2019-05-02 DIAGNOSIS — Z981 Arthrodesis status: Secondary | ICD-10-CM

## 2019-05-02 NOTE — Progress Notes (Deleted)
Office Visit Note   Patient: Rachel [name]           Date of Birth: November 03, 1945           MRN: [id number] Visit Date: 05/02/2019              Requested by: [name], MD [address] [place],  [place] [address] PCP: [name], MD   Assessment & Plan: Visit Diagnoses:  1. Status post lumbar spinal fusion     Plan: ***  Follow-Up Instructions: No follow-ups on file.   Orders:  Orders Placed This Encounter  Procedures  . XR Lumbar Spine 2-3 Views   No orders of the defined types were placed in this encounter.     Procedures: No procedures performed   Clinical Data: No additional findings.   Subjective: Chief Complaint  Patient presents with  . Lower Back - Follow-up    02/13/2019 L4-5 GILL Procedure, TLIF, Pedicle Instrumentation, Bilat Lat Fusion    HPI  Review of Systems   Objective: Vital Signs: BP (!) 167/102   Pulse 65   Ht 5' 4.5" (1.638 m)   Wt 146 lb (66.2 kg)   BMI 24.67 kg/m   Physical Exam  Ortho Exam  Specialty Comments:  No specialty comments available.  Imaging: No results found.   PMFS History: Patient Active Problem List   Diagnosis Date Noted  . S/P lumbar fusion 02/21/2019  . Lumbar spinal stenosis 02/13/2019  . DDD (degenerative disc disease), lumbar 07/06/2018  . Osteoporosis 04/24/2018  . Encounter for gynecological examination with Papanicolaou smear of cervix 09/08/2015  . Abnormal Pap smear of vagina 10/15/2014  . Acquired trigger finger 09/03/2014  . Constipation 04/04/2014  . VAIN (vaginal intraepithelial neoplasia) 06/20/2013  . Knee pain, right 06/14/2013  . Allergic rhinitis 08/22/2012  . Neuropathy, arm 02/19/2012  . GERD (gastroesophageal reflux disease) 02/19/2012  . Alopecia 02/19/2012  . Anxiety 10/03/2011  . Tremor 10/03/2011  . Tobacco use 10/03/2011  . OA (osteoarthritis) 08/11/2011  . Cancer ([place]) 08/11/2011  . Essential hypertension, benign 08/09/2011  . Vitamin D  deficiency 08/09/2011   Past Medical History:  Diagnosis Date  . Back pain   . Carpal tunnel syndrome   . Constipation   . Hypertension   . Low grade squamous intraepith lesion on cytologic smear cervix (lgsil)    +hpv, HAD HYSTERECTOMY  . OA (osteoarthritis)     Family History  Problem Relation Age of Onset  . Diabetes Father   . Cancer Father        prostate cancer  . Cancer Sister        breast  . Hypertension Son   . Cancer Maternal Grandmother   . Eczema Daughter   . Other Daughter        stomach issues    Past Surgical History:  Procedure Laterality Date  . ABDOMINAL HYSTERECTOMY  2006  . BREAST CYST EXCISION     right  . BUNIONECTOMY     left  . CARPAL TUNNEL RELEASE Right   . ESOPHAGOGASTRODUODENOSCOPY  06/13/2012   Procedure: ESOPHAGOGASTRODUODENOSCOPY (EGD);  Surgeon: Rogene Houston, MD;  Location: [facility];  Service: Endoscopy;  Laterality: N/A;  200  . EYE SURGERY Bilateral 2020  . TRIGGER FINGER RELEASE Right 08/25/2016   Procedure: RELEASE TRIGGER FINGER/A-1 PULLEY RIGHT LONG TRIGGER FINGER RELEASE;  Surgeon: Carole Civil, MD;  Location: [facility] ORS;  Service: Orthopedics;  Laterality: Right;  Social History   Occupational History  . Not on file  Tobacco Use  . Smoking status: Former Smoker    Packs/day: 0.25    Years: 50.00    Pack years: 12.50    Types: Cigarettes    Quit date: 07/28/2013    Years since quitting: 5.7  . Smokeless tobacco: Never Used  Substance and Sexual Activity  . Alcohol use: Yes    Alcohol/week: 0.0 standard drinks    Comment: occ  . Drug use: No  . Sexual activity: Not Currently    Birth control/protection: Surgical    Comment: hyst

## 2019-05-03 NOTE — Progress Notes (Signed)
Post-Op Visit Note   Patient: Rachel [name]           Date of Birth: [birth year]           MRN: [id number] Visit Date: 05/02/2019 PCP: [name], MD   Assessment & Plan: Postop L4-5 fusion with occasional discomfort she uses some Aleve off-and-on off pain medication had problems with significant constipation.  Two-view x-ray showed good position and alignment of screws cage and graft.  She can wean her way out of her support gradually increase her workout activities and distance ambulating.  She is happy with the improvement in her back pain and leg pain.  Chief Complaint:  Chief Complaint  Patient presents with  . Lower Back - Follow-up    02/13/2019 L4-5 GILL Procedure, TLIF, Pedicle Instrumentation, Bilat Lat Fusion   Visit Diagnoses:  1. Status post lumbar spinal fusion   2. S/P lumbar fusion     Plan: Recheck 3 months.  Single lateral x-ray on return visit lumbar spine.  Follow-Up Instructions: Return in about 3 months (around 07/31/2019).   Orders:  Orders Placed This Encounter  Procedures  . XR Lumbar Spine 2-3 Views   No orders of the defined types were placed in this encounter.   Imaging: Xr Lumbar Spine 2-3 Views  Result Date: 05/02/2019 [facility] lateral lumbar spine x-rays are obtained and reviewed.  This shows single level L4-5 fusion cage pedicle instrumentation.  Good position of cage graft no screw loosening. Impression: Satisfactory single level L4-5 fusion.   PMFS History: Patient Active Problem List   Diagnosis Date Noted  . S/P lumbar fusion 02/21/2019  . DDD (degenerative disc disease), lumbar 07/06/2018  . Osteoporosis 04/24/2018  . Encounter for gynecological examination with Papanicolaou smear of cervix 09/08/2015  . Abnormal Pap smear of vagina 10/15/2014  . Acquired trigger finger 09/03/2014  . Constipation 04/04/2014  . VAIN (vaginal intraepithelial neoplasia) 06/20/2013  . Knee pain, right 06/14/2013  . Allergic rhinitis 08/22/2012  .  Neuropathy, arm 02/19/2012  . GERD (gastroesophageal reflux disease) 02/19/2012  . Alopecia 02/19/2012  . Anxiety 10/03/2011  . Tremor 10/03/2011  . Tobacco use 10/03/2011  . OA (osteoarthritis) 08/11/2011  . Cancer ([place]) 08/11/2011  . Essential hypertension, benign 08/09/2011  . Vitamin D deficiency 08/09/2011   Past Medical History:  Diagnosis Date  . Back pain   . Carpal tunnel syndrome   . Constipation   . Hypertension   . Low grade squamous intraepith lesion on cytologic smear cervix (lgsil)    +hpv, HAD HYSTERECTOMY  . OA (osteoarthritis)     Family History  Problem Relation Age of Onset  . Diabetes Father   . Cancer Father        prostate cancer  . Cancer Sister        breast  . Hypertension Son   . Cancer Maternal Grandmother   . Eczema Daughter   . Other Daughter        stomach issues    Past Surgical History:  Procedure Laterality Date  . ABDOMINAL HYSTERECTOMY  2006  . BREAST CYST EXCISION     right  . BUNIONECTOMY     left  . CARPAL TUNNEL RELEASE Right   . ESOPHAGOGASTRODUODENOSCOPY  06/13/2012   Procedure: ESOPHAGOGASTRODUODENOSCOPY (EGD);  Surgeon: Rogene Houston, MD;  Location: [facility];  Service: Endoscopy;  Laterality: N/A;  200  . EYE SURGERY Bilateral 2020  . TRIGGER FINGER RELEASE Right 08/25/2016   Procedure: RELEASE  TRIGGER FINGER/A-1 PULLEY RIGHT LONG TRIGGER FINGER RELEASE;  Surgeon: Carole Civil, MD;  Location: [facility]S;  Service: Orthopedics;  Laterality: Right;   Social History   Occupational History  . Not on file  Tobacco Use  . Smoking status: Former Smoker    Packs/day: 0.25    Years: 50.00    Pack years: 12.50    Types: Cigarettes    Quit date: 07/28/2013    Years since quitting: 5.7  . Smokeless tobacco: Never Used  Substance and Sexual Activity  . Alcohol use: Yes    Alcohol/week: 0.0 standard drinks    Comment: occ  . Drug use: No  . Sexual activity: Not Currently    Birth control/protection: Surgical     Comment: hyst

## 2019-05-14 ENCOUNTER — Other Ambulatory Visit: Payer: Self-pay | Admitting: *Deleted

## 2019-05-14 DIAGNOSIS — I10 Essential (primary) hypertension: Secondary | ICD-10-CM

## 2019-05-14 MED ORDER — LISINOPRIL 20 MG PO TABS: 20.0000 mg | ORAL_TABLET | Freq: Every day | ORAL | 1 refills | Status: DC

## 2019-06-20 ENCOUNTER — Encounter: Payer: Self-pay | Admitting: Family Medicine

## 2019-06-24 ENCOUNTER — Telehealth: Payer: Self-pay | Admitting: Family Medicine

## 2019-06-24 NOTE — Telephone Encounter (Signed)
Patient called in states someone called her last week about her prolia. She still has [employer] from last year.  CB# [phone]

## 2019-06-25 NOTE — Telephone Encounter (Signed)
Awaiting benefit verification from [place].

## 2019-07-31 ENCOUNTER — Ambulatory Visit: Payer: Self-pay

## 2019-07-31 ENCOUNTER — Encounter: Payer: Self-pay | Admitting: Family Medicine

## 2019-07-31 ENCOUNTER — Ambulatory Visit (INDEPENDENT_AMBULATORY_CARE_PROVIDER_SITE_OTHER): Payer: Medicare Other | Admitting: Family Medicine

## 2019-07-31 ENCOUNTER — Other Ambulatory Visit: Payer: Self-pay

## 2019-07-31 VITALS — BP 128/66 | HR 78 | Temp 98.7°F | Resp 14 | Ht 64.5 in | Wt 157.0 lb

## 2019-07-31 DIAGNOSIS — M810 Age-related osteoporosis without current pathological fracture: Secondary | ICD-10-CM

## 2019-07-31 DIAGNOSIS — R6889 Other general symptoms and signs: Secondary | ICD-10-CM | POA: Diagnosis not present

## 2019-07-31 DIAGNOSIS — E785 Hyperlipidemia, unspecified: Secondary | ICD-10-CM

## 2019-07-31 DIAGNOSIS — I10 Essential (primary) hypertension: Secondary | ICD-10-CM

## 2019-07-31 DIAGNOSIS — K219 Gastro-esophageal reflux disease without esophagitis: Secondary | ICD-10-CM | POA: Diagnosis not present

## 2019-07-31 DIAGNOSIS — Z981 Arthrodesis status: Secondary | ICD-10-CM

## 2019-07-31 DIAGNOSIS — E559 Vitamin D deficiency, unspecified: Secondary | ICD-10-CM

## 2019-07-31 MED ORDER — PROPRANOLOL HCL ER 60 MG PO CP24: 60.0000 mg | ORAL_CAPSULE | Freq: Every day | ORAL | 1 refills | Status: DC

## 2019-07-31 MED ORDER — DENOSUMAB 60 MG/ML ~~LOC~~ SOSY
60.0000 mg | PREFILLED_SYRINGE | Freq: Once | SUBCUTANEOUS | Status: AC
  Administered 2019-07-31: 60 mg via SUBCUTANEOUS

## 2019-07-31 NOTE — Assessment & Plan Note (Signed)
Bone density to be scheduled.  Prolia injection given she is on vitamin D we will check her calcium level

## 2019-07-31 NOTE — Assessment & Plan Note (Signed)
Blood pressure looks okay in the office but she has fluctuating blood pressures I think is because her beta-blocker is running out since it has not extended release.  She would like to decrease her pill burden we will place her on extended release propanolol 60 mg along with the lisinopril 20 mg.  We will check her renal function TSH today.

## 2019-07-31 NOTE — Assessment & Plan Note (Signed)
She continues to benefit from the pantoprazole.

## 2019-07-31 NOTE — Progress Notes (Signed)
Subjective:    Patient ID: Rachel [name], female    DOB: Jun 15, 1945, 74 y.o.   MRN: [id number]  Patient presents for Follow-up (is not fasting) and Injections (Prolia)  Patient here to follow-up medications.  Her last visit was in February 2020 He had back surgery performed by Dr. [name] the end of last year.  Hypertension I added lisinopril 20 mg after her surgery secondary to elevated blood pressure.  She was continued on propanolol.  This was back in October 2020 Her BP at home has been fluctuating, in the morning its high up to  [id number] systolic/ [id number], this AM 150/90's   BP at bedtime is normal or low  She has been taking propranolol 40mg  in the morning and lisinopril in the evening  Osteoporosis currently on Prolia.  She is due for bone density her last one was in January 2018, she has vitamin D def, currently taking Vitamin D 1000IU  , she had elevated calcium in the past, therefore no current supplement  Acid reflux she is continued on pantoprazole 40 mg once a day  She is due for fasting labs   Constipation- had severe constipation after surgery, took a few doses magnesium citrate and that helped, now taking colace a few times  Week, eating more fruit , irregular bowels movements   Wanted to know if she can take a "med for energy".  Also states at times she does get little short winded after she does a lot of activity.     Review Of Systems:  GEN- denies fatigue, fever, weight loss,weakness, recent illness HEENT- denies eye drainage, change in vision, nasal discharge, CVS- denies chest pain, palpitations RESP- denies SOB, cough, wheeze ABD- denies N/V, change in stools, abd pain GU- denies dysuria, hematuria, dribbling, incontinence MSK- + joint pain, muscle aches, injury Neuro- denies headache, dizziness, syncope, seizure activity       Objective:    BP 128/66   Pulse 78   Temp 98.7 F (37.1 C) (Temporal)   Resp 14   Ht 5' 4.5" (1.638 m)   Wt 157 lb  (71.2 kg)   SpO2 96%   BMI 26.53 kg/m  GEN- NAD, alert and oriented x3 HEENT- PERRL, EOMI, non injected sclera, pink conjunctiva, MMM, oropharynx clear Neck- Supple, no thyromegaly CVS- RRR, no murmur RESP-CTAB ABD-NABS,soft,NT,ND EXT- No edema Pulses- Radial, DP- 2+        Assessment & Plan:      Problem List Items Addressed This Visit      Unprioritized   Essential hypertension, benign - Primary (Chronic)    Blood pressure looks okay in the office but she has fluctuating blood pressures I think is because her beta-blocker is running out since it has not extended release.  She would like to decrease her pill burden we will place her on extended release propanolol 60 mg along with the lisinopril 20 mg.  We will check her renal function TSH today.      Relevant Medications   propranolol ER (INDERAL LA) 60 MG 24 hr capsule   Other Relevant Orders   CBC with Differential/Platelet   Comprehensive metabolic panel   TSH   GERD (gastroesophageal reflux disease) (Chronic)    She continues to benefit from the pantoprazole.      Hyperlipidemia   Relevant Medications   propranolol ER (INDERAL LA) 60 MG 24 hr capsule   Other Relevant Orders   Lipid panel   Osteoporosis    Bone density to  be scheduled.  Prolia injection given she is on vitamin D we will check her calcium level      Relevant Medications   cholecalciferol (VITAMIN D3) 25 MCG (1000 UNIT) tablet   vitamin E (VITAMIN E) 180 MG (400 UNITS) capsule   Other Relevant Orders   DG Bone Density   S/P lumbar fusion    Acquiring any chronic pain medication at this time.  She is going to start with exercise to see if this helps with back discomfort and her overall endurance.  I think will help her cardiovascular wise as well to get moving.  She is can start with walking in the next few weeks.      Vitamin D deficiency   Relevant Orders   Vitamin D, 25-hydroxy      Note: This dictation was prepared with Dragon  dictation along with smaller phrase technology. Any transcriptional errors that result from this process are unintentional.

## 2019-07-31 NOTE — Patient Instructions (Addendum)
Bone Density to be scheduled  B12 1080mcg once a day  F/U 6 months for Physical

## 2019-07-31 NOTE — Assessment & Plan Note (Signed)
Acquiring any chronic pain medication at this time.  She is going to start with exercise to see if this helps with back discomfort and her overall endurance.  I think will help her cardiovascular wise as well to get moving.  She is can start with walking in the next few weeks.

## 2019-08-01 LAB — COMPREHENSIVE METABOLIC PANEL
AG Ratio: 1.4 (calc) (ref 1.0–2.5)
ALT: 14 U/L (ref 6–29)
AST: 20 U/L (ref 10–35)
Albumin: 4.3 g/dL (ref 3.6–5.1)
Alkaline phosphatase (APISO): 48 U/L (ref 37–153)
BUN/Creatinine Ratio: 13 (calc) (ref 6–22)
BUN: 14 mg/dL (ref 7–25)
CO2: 27 mmol/L (ref 20–32)
Calcium: 10.6 mg/dL — ABNORMAL HIGH (ref 8.6–10.4)
Chloride: 105 mmol/L (ref 98–110)
Creat: 1.11 mg/dL — ABNORMAL HIGH (ref 0.60–0.93)
Globulin: 3 g/dL (calc) (ref 1.9–3.7)
Glucose, Bld: 106 mg/dL — ABNORMAL HIGH (ref 65–99)
Potassium: 4.1 mmol/L (ref 3.5–5.3)
Sodium: 140 mmol/L (ref 135–146)
Total Bilirubin: 0.6 mg/dL (ref 0.2–1.2)
Total Protein: 7.3 g/dL (ref 6.1–8.1)

## 2019-08-01 LAB — CBC WITH DIFFERENTIAL/PLATELET
Absolute Monocytes: 531 cells/uL (ref 200–950)
Basophils Absolute: 49 cells/uL (ref 0–200)
Basophils Relative: 0.8 %
Eosinophils Absolute: 67 cells/uL (ref 15–500)
Eosinophils Relative: 1.1 %
HCT: 40.1 % (ref 35.0–45.0)
Hemoglobin: 13.1 g/dL (ref 11.7–15.5)
Lymphs Abs: 1549 cells/uL (ref 850–3900)
MCH: 28.2 pg (ref 27.0–33.0)
MCHC: 32.7 g/dL (ref 32.0–36.0)
MCV: 86.2 fL (ref 80.0–100.0)
MPV: 12.3 fL (ref 7.5–12.5)
Monocytes Relative: 8.7 %
Neutro Abs: 3904 cells/uL (ref 1500–7800)
Neutrophils Relative %: 64 %
Platelets: 214 10*3/uL (ref 140–400)
RBC: 4.65 10*6/uL (ref 3.80–5.10)
RDW: 14.4 % (ref 11.0–15.0)
Total Lymphocyte: 25.4 %
WBC: 6.1 10*3/uL (ref 3.8–10.8)

## 2019-08-01 LAB — LIPID PANEL
Cholesterol: 248 mg/dL — ABNORMAL HIGH (ref <200)
HDL: 120 mg/dL (ref >=50)
LDL Cholesterol (Calc): 110 mg/dL (calc) — ABNORMAL HIGH
Non-HDL Cholesterol (Calc): 128 mg/dL (calc) (ref <130)
Total CHOL/HDL Ratio: 2.1 (calc) (ref <5.0)
Triglycerides: 90 mg/dL (ref <150)

## 2019-08-01 LAB — TSH: TSH: 0.56 mIU/L (ref 0.40–4.50)

## 2019-08-01 LAB — VITAMIN D 25 HYDROXY (VIT D DEFICIENCY, FRACTURES): Vit D, 25-Hydroxy: 53 ng/mL (ref 30–100)

## 2019-08-13 ENCOUNTER — Other Ambulatory Visit (HOSPITAL_COMMUNITY): Payer: Self-pay | Admitting: Family Medicine

## 2019-08-13 DIAGNOSIS — Z1231 Encounter for screening mammogram for malignant neoplasm of breast: Secondary | ICD-10-CM

## 2019-08-15 ENCOUNTER — Other Ambulatory Visit: Payer: Self-pay

## 2019-08-15 NOTE — Patient Outreach (Signed)
[facility]) Care Management  08/15/2019  Rachel [name] 06-29-1945 [id number]   Medication Adherence call to Rachel [name] [facility] Ident[facility] Verify spoke with patient she is past due on Lisinopril 20 mg she is taking 1 tablet daily ,patient explain she is expecting a prescription from mail order,patient explain she still has a few left. Rachel [name] is showing past due under [place].  [facility] Management Direct Dial [phone]  Fax [phone] [email or web address]

## 2019-08-19 ENCOUNTER — Other Ambulatory Visit: Payer: Self-pay

## 2019-08-19 ENCOUNTER — Ambulatory Visit (HOSPITAL_COMMUNITY)
Admission: RE | Admit: 2019-08-19 | Discharge: 2019-08-19 | Disposition: A | Discharge status: Home / Self Care | Payer: Medicare Other | Source: Ambulatory Visit | Attending: Family Medicine | Admitting: Family Medicine

## 2019-08-19 DIAGNOSIS — Z1231 Encounter for screening mammogram for malignant neoplasm of breast: Secondary | ICD-10-CM

## 2019-08-19 DIAGNOSIS — M85851 Other specified disorders of bone density and structure, right thigh: Secondary | ICD-10-CM | POA: Diagnosis not present

## 2019-08-19 DIAGNOSIS — M810 Age-related osteoporosis without current pathological fracture: Secondary | ICD-10-CM | POA: Diagnosis not present

## 2019-08-22 ENCOUNTER — Other Ambulatory Visit: Payer: Self-pay | Admitting: Family Medicine

## 2019-08-22 DIAGNOSIS — I10 Essential (primary) hypertension: Secondary | ICD-10-CM

## 2019-08-27 NOTE — Telephone Encounter (Signed)
disregard

## 2019-09-11 ENCOUNTER — Other Ambulatory Visit: Payer: Self-pay | Admitting: *Deleted

## 2019-09-11 DIAGNOSIS — I10 Essential (primary) hypertension: Secondary | ICD-10-CM

## 2019-09-11 MED ORDER — LISINOPRIL 20 MG PO TABS: 20.0000 mg | ORAL_TABLET | Freq: Every day | ORAL | 3 refills | Status: DC

## 2019-09-19 ENCOUNTER — Other Ambulatory Visit: Payer: Self-pay

## 2019-09-19 ENCOUNTER — Ambulatory Visit (INDEPENDENT_AMBULATORY_CARE_PROVIDER_SITE_OTHER): Payer: Medicare Other | Admitting: Orthopaedic Surgery

## 2019-09-19 ENCOUNTER — Ambulatory Visit: Payer: Self-pay

## 2019-09-19 ENCOUNTER — Encounter: Payer: Self-pay | Admitting: Orthopaedic Surgery

## 2019-09-19 VITALS — BP 165/103 | HR 66 | Ht 64.5 in | Wt 152.0 lb

## 2019-09-19 DIAGNOSIS — M545 Low back pain: Secondary | ICD-10-CM

## 2019-09-19 DIAGNOSIS — G8929 Other chronic pain: Secondary | ICD-10-CM | POA: Diagnosis not present

## 2019-09-19 NOTE — Progress Notes (Signed)
Office Visit Note   Patient: Rachel [name]           Date of Birth: 1945/08/18           MRN: [id number] Visit Date: 09/19/2019              Requested by: [name], MD [address] [place],  [place] [address] PCP: [name], MD   Assessment & Plan: Visit Diagnoses:  1. Chronic left-sided low back pain, unspecified whether sciatica present     Plan: We reviewed her preoperative MRI scan.  She had single level disease and had minimal changes at other levels.  We will set her up for some physical therapy at [facility] in [place] for therapy core strengthening evaluation and treatment.  I plan to recheck her in 6 weeks.  Follow-Up Instructions: No follow-ups on file.   Orders:  Orders Placed This Encounter  Procedures  . XR Lumbar Spine 2-3 Views   No orders of the defined types were placed in this encounter.     Procedures: No procedures performed   Clinical Data: No additional findings.   Subjective: Chief Complaint  Patient presents with  . Lower Back - Pain    HPI 74 year old female returns she states that is her surgery in September she noted gradual progressive improvement but in the last 2 months she states she does not notice any continued improvement.  She has been ambulatory she states she does not have any pain if she sits.  If she stands for longer period time she has some discomfort in the lumbar spine.  Pain is in her back occasionally radiates a little bit to the left side occasionally she has had a little bit of throbbing in her right thigh.  Sometimes when she turns or twists she feels a pop in her back.  She is not noted any bowel or bladder symptoms.  She is walking better than she was preoperatively.  Review of Systems use systems updated unchanged since September 2020 surgery other than as mentioned in HPI.   Objective: Vital Signs: BP (!) 165/103   Pulse 66   Ht 5' 4.5" (1.638 m)   Wt 152 lb (68.9 kg)   BMI 25.69 kg/m    Physical Exam Constitutional:      Appearance: She is well-developed.  HENT:     Head: Normocephalic.     Right Ear: External ear normal.     Left Ear: External ear normal.  Eyes:     Pupils: Pupils are equal, round, and reactive to light.  Neck:     Thyroid: No thyromegaly.     Trachea: No tracheal deviation.  Cardiovascular:     Rate and Rhythm: Normal rate.  Pulmonary:     Effort: Pulmonary effort is normal.  Abdominal:     Palpations: Abdomen is soft.  Skin:    General: Skin is warm and dry.  Neurological:     Mental Status: She is alert and oriented to person, place, and time.  Psychiatric:        Behavior: Behavior normal.     Ortho Exam patient has intact reflexes she is able to heel and toe walk lumbar incisions well-healed no sciatic notch tenderness.  Negative popliteal compression test.  Specialty Comments:  No specialty comments available.  Imaging: No results found.   PMFS History: Patient Active Problem List   Diagnosis Date Noted  . Hyperlipidemia 07/31/2019  . S/P lumbar  fusion 02/21/2019  . DDD (degenerative disc disease), lumbar 07/06/2018  . Osteoporosis 04/24/2018  . Encounter for gynecological examination with Papanicolaou smear of cervix 09/08/2015  . Abnormal Pap smear of vagina 10/15/2014  . Acquired trigger finger 09/03/2014  . Constipation 04/04/2014  . VAIN (vaginal intraepithelial neoplasia) 06/20/2013  . Knee pain, right 06/14/2013  . Allergic rhinitis 08/22/2012  . Neuropathy, arm 02/19/2012  . GERD (gastroesophageal reflux disease) 02/19/2012  . Alopecia 02/19/2012  . Anxiety 10/03/2011  . Tremor 10/03/2011  . Tobacco use 10/03/2011  . OA (osteoarthritis) 08/11/2011  . Cancer ([place]) 08/11/2011  . Essential hypertension, benign 08/09/2011  . Vitamin D deficiency 08/09/2011   Past Medical History:  Diagnosis Date  . Back pain   . Carpal tunnel syndrome   . Constipation   . Hypertension   . Low grade squamous intraepith  lesion on cytologic smear cervix (lgsil)    +hpv, HAD HYSTERECTOMY  . OA (osteoarthritis)     Family History  Problem Relation Age of Onset  . Diabetes Father   . Cancer Father        prostate cancer  . Cancer Sister        breast  . Hypertension Son   . Cancer Maternal Grandmother   . Eczema Daughter   . Other Daughter        stomach issues    Past Surgical History:  Procedure Laterality Date  . ABDOMINAL HYSTERECTOMY  2006  . BREAST CYST EXCISION     right  . BUNIONECTOMY     left  . CARPAL TUNNEL RELEASE Right   . ESOPHAGOGASTRODUODENOSCOPY  06/13/2012   Procedure: ESOPHAGOGASTRODUODENOSCOPY (EGD);  Surgeon: Rogene Houston, MD;  Location: [facility];  Service: Endoscopy;  Laterality: N/A;  200  . EYE SURGERY Bilateral 2020  . TRIGGER FINGER RELEASE Right 08/25/2016   Procedure: RELEASE TRIGGER FINGER/A-1 PULLEY RIGHT LONG TRIGGER FINGER RELEASE;  Surgeon: Carole Civil, MD;  Location: [facility] ORS;  Service: Orthopedics;  Laterality: Right;   Social History   Occupational History  . Not on file  Tobacco Use  . Smoking status: Former Smoker    Packs/day: 0.25    Years: 50.00    Pack years: 12.50    Types: Cigarettes    Quit date: 07/28/2013    Years since quitting: 6.1  . Smokeless tobacco: Never Used  Substance and Sexual Activity  . Alcohol use: Yes    Alcohol/week: 0.0 standard drinks    Comment: occ  . Drug use: No  . Sexual activity: Not Currently    Birth control/protection: Surgical    Comment: hyst

## 2019-09-20 ENCOUNTER — Telehealth: Payer: Self-pay | Admitting: *Deleted

## 2019-09-20 NOTE — Telephone Encounter (Signed)
   I am okay with her changing but Dr. Salvadore Oxford office may not take her, since she is already established at Dr. Olevia Perches office   She could go to [facility] but will likely run into the same issue Please explain this to her.  Rachel [name] is no longer at [employer] ( she retired)    There is also a GI in [place], [place]

## 2019-09-20 NOTE — Telephone Encounter (Signed)
Received call from patient.   Reports that she would like to see a new GI in [place]. Reports that she continues to have reflux and constipation.   Reports that she has been seen by [facility] for a while, but feels that she would benefit from second opinion.   MD please advise.

## 2019-09-23 NOTE — Telephone Encounter (Signed)
Spoke with patient and she is ok with staying at [place] office as I informed her that Allen Norris, NP has retired.

## 2019-09-28 DIAGNOSIS — I719 Aortic aneurysm of unspecified site, without rupture: Secondary | ICD-10-CM

## 2019-09-28 DIAGNOSIS — S82899A Other fracture of unspecified lower leg, initial encounter for closed fracture: Secondary | ICD-10-CM

## 2019-09-28 HISTORY — DX: Aortic aneurysm of unspecified site, without rupture: I71.9

## 2019-09-28 HISTORY — DX: Other fracture of unspecified lower leg, initial encounter for closed fracture: S82.899A

## 2019-10-04 ENCOUNTER — Telehealth: Payer: Self-pay | Admitting: Orthopaedic Surgery

## 2019-10-04 NOTE — Telephone Encounter (Signed)
Patient called wanted Dr [name] to know that she is still having trouble lifting, moving and standing for long periods of time. Patient said (PT) has not started yet and will start one day next week. Patient said sweeping and dusting is very hard. Patient also said cleaning the bathroom is very difficult. Patient asked if Dr [name] would recommend someone that can come help her in the home.   The number to contact patient is [phone]

## 2019-10-07 NOTE — Telephone Encounter (Signed)
Please advise. Thanks.  

## 2019-10-07 NOTE — Telephone Encounter (Signed)
OK to have [facility] send someone out for an assessment. Let her know that after therapy she should be improving. Thanks

## 2019-10-07 NOTE — Telephone Encounter (Signed)
Tried calling patient to discuss per Dr [name], no answer. Unable to LM VM is full.

## 2019-10-08 NOTE — Telephone Encounter (Signed)
Tried calling again, no answer. Unable to [facility]

## 2019-10-10 ENCOUNTER — Emergency Department (HOSPITAL_COMMUNITY): Payer: Medicare Other

## 2019-10-10 ENCOUNTER — Encounter (HOSPITAL_COMMUNITY): Payer: Self-pay | Admitting: *Deleted

## 2019-10-10 ENCOUNTER — Emergency Department (HOSPITAL_COMMUNITY)
Admission: EM | Admit: 2019-10-10 | Discharge: 2019-10-11 | Disposition: A | Discharge status: Home / Self Care | Payer: Medicare Other | Attending: Emergency Medicine | Admitting: Emergency Medicine

## 2019-10-10 ENCOUNTER — Other Ambulatory Visit: Payer: Self-pay

## 2019-10-10 DIAGNOSIS — M25571 Pain in right ankle and joints of right foot: Secondary | ICD-10-CM | POA: Diagnosis not present

## 2019-10-10 DIAGNOSIS — M542 Cervicalgia: Secondary | ICD-10-CM | POA: Insufficient documentation

## 2019-10-10 DIAGNOSIS — Y999 Unspecified external cause status: Secondary | ICD-10-CM | POA: Diagnosis not present

## 2019-10-10 DIAGNOSIS — Z79899 Other long term (current) drug therapy: Secondary | ICD-10-CM | POA: Diagnosis not present

## 2019-10-10 DIAGNOSIS — Z743 Need for continuous supervision: Secondary | ICD-10-CM | POA: Diagnosis not present

## 2019-10-10 DIAGNOSIS — I712 Thoracic aortic aneurysm, without rupture, unspecified: Secondary | ICD-10-CM

## 2019-10-10 DIAGNOSIS — Z87891 Personal history of nicotine dependence: Secondary | ICD-10-CM | POA: Insufficient documentation

## 2019-10-10 DIAGNOSIS — Y9389 Activity, other specified: Secondary | ICD-10-CM | POA: Diagnosis not present

## 2019-10-10 DIAGNOSIS — S8991XA Unspecified injury of right lower leg, initial encounter: Secondary | ICD-10-CM | POA: Diagnosis not present

## 2019-10-10 DIAGNOSIS — S0990XA Unspecified injury of head, initial encounter: Secondary | ICD-10-CM | POA: Diagnosis not present

## 2019-10-10 DIAGNOSIS — S8002XA Contusion of left knee, initial encounter: Secondary | ICD-10-CM | POA: Diagnosis not present

## 2019-10-10 DIAGNOSIS — S92014A Nondisplaced fracture of body of right calcaneus, initial encounter for closed fracture: Secondary | ICD-10-CM | POA: Diagnosis not present

## 2019-10-10 DIAGNOSIS — M7989 Other specified soft tissue disorders: Secondary | ICD-10-CM | POA: Diagnosis not present

## 2019-10-10 DIAGNOSIS — I491 Atrial premature depolarization: Secondary | ICD-10-CM | POA: Diagnosis not present

## 2019-10-10 DIAGNOSIS — Y9289 Other specified places as the place of occurrence of the external cause: Secondary | ICD-10-CM | POA: Insufficient documentation

## 2019-10-10 DIAGNOSIS — S92001A Unspecified fracture of right calcaneus, initial encounter for closed fracture: Secondary | ICD-10-CM | POA: Diagnosis not present

## 2019-10-10 DIAGNOSIS — S8992XA Unspecified injury of left lower leg, initial encounter: Secondary | ICD-10-CM | POA: Diagnosis not present

## 2019-10-10 DIAGNOSIS — S3991XA Unspecified injury of abdomen, initial encounter: Secondary | ICD-10-CM | POA: Diagnosis not present

## 2019-10-10 DIAGNOSIS — R0789 Other chest pain: Secondary | ICD-10-CM | POA: Diagnosis not present

## 2019-10-10 DIAGNOSIS — M25561 Pain in right knee: Secondary | ICD-10-CM | POA: Diagnosis not present

## 2019-10-10 DIAGNOSIS — S99911A Unspecified injury of right ankle, initial encounter: Secondary | ICD-10-CM | POA: Diagnosis not present

## 2019-10-10 DIAGNOSIS — R0689 Other abnormalities of breathing: Secondary | ICD-10-CM | POA: Diagnosis not present

## 2019-10-10 DIAGNOSIS — M25562 Pain in left knee: Secondary | ICD-10-CM | POA: Diagnosis not present

## 2019-10-10 DIAGNOSIS — R52 Pain, unspecified: Secondary | ICD-10-CM

## 2019-10-10 DIAGNOSIS — S8001XA Contusion of right knee, initial encounter: Secondary | ICD-10-CM | POA: Insufficient documentation

## 2019-10-10 DIAGNOSIS — I10 Essential (primary) hypertension: Secondary | ICD-10-CM | POA: Diagnosis not present

## 2019-10-10 DIAGNOSIS — R079 Chest pain, unspecified: Secondary | ICD-10-CM | POA: Diagnosis not present

## 2019-10-10 DIAGNOSIS — S299XXA Unspecified injury of thorax, initial encounter: Secondary | ICD-10-CM | POA: Diagnosis not present

## 2019-10-10 LAB — CBC
HCT: 43 % (ref 36.0–46.0)
Hemoglobin: 13.8 g/dL (ref 12.0–15.0)
MCH: 29.5 pg (ref 26.0–34.0)
MCHC: 32.1 g/dL (ref 30.0–36.0)
MCV: 91.9 fL (ref 80.0–100.0)
Platelets: 195 10*3/uL (ref 150–400)
RBC: 4.68 MIL/uL (ref 3.87–5.11)
RDW: 14.3 % (ref 11.5–15.5)
WBC: 8.4 10*3/uL (ref 4.0–10.5)
nRBC: 0 % (ref 0.0–0.2)

## 2019-10-10 LAB — COMPREHENSIVE METABOLIC PANEL
ALT: 24 U/L (ref 0–44)
AST: 48 U/L — ABNORMAL HIGH (ref 15–41)
Albumin: 3.9 g/dL (ref 3.5–5.0)
Alkaline Phosphatase: 39 U/L (ref 38–126)
Anion gap: 14 (ref 5–15)
BUN: 12 mg/dL (ref 8–23)
CO2: 22 mmol/L (ref 22–32)
Calcium: 9.6 mg/dL (ref 8.9–10.3)
Chloride: 105 mmol/L (ref 98–111)
Creatinine, Ser: 0.95 mg/dL (ref 0.44–1.00)
GFR calc Af Amer: >60 mL/min (ref >60)
GFR calc non Af Amer: 59 mL/min — ABNORMAL LOW (ref >60)
Glucose, Bld: 100 mg/dL — ABNORMAL HIGH (ref 70–99)
Potassium: 4.6 mmol/L (ref 3.5–5.1)
Sodium: 141 mmol/L (ref 135–145)
Total Bilirubin: 1 mg/dL (ref 0.3–1.2)
Total Protein: 7.1 g/dL (ref 6.5–8.1)

## 2019-10-10 LAB — I-STAT CHEM 8, ED
BUN: 15 mg/dL (ref 8–23)
Calcium, Ion: 1.18 mmol/L (ref 1.15–1.40)
Chloride: 107 mmol/L (ref 98–111)
Creatinine, Ser: 0.8 mg/dL (ref 0.44–1.00)
Glucose, Bld: 101 mg/dL — ABNORMAL HIGH (ref 70–99)
HCT: 45 % (ref 36.0–46.0)
Hemoglobin: 15.3 g/dL — ABNORMAL HIGH (ref 12.0–15.0)
Potassium: 3.8 mmol/L (ref 3.5–5.1)
Sodium: 140 mmol/L (ref 135–145)
TCO2: 26 mmol/L (ref 22–32)

## 2019-10-10 MED ORDER — METOPROLOL TARTRATE 5 MG/5ML IV SOLN
5.0000 mg | Freq: Once | INTRAVENOUS | Status: DC
  Filled 2019-10-10: qty 5

## 2019-10-10 MED ORDER — IOHEXOL 300 MG/ML  SOLN
100.0000 mL | Freq: Once | INTRAMUSCULAR | Status: AC | PRN
  Administered 2019-10-10: 100 mL via INTRAVENOUS

## 2019-10-10 MED ORDER — HYDROCODONE-ACETAMINOPHEN 5-325 MG PO TABS
1.0000 | ORAL_TABLET | Freq: Once | ORAL | Status: AC
  Administered 2019-10-10: 1 via ORAL
  Filled 2019-10-10: qty 1

## 2019-10-10 MED ORDER — HYDROCODONE-ACETAMINOPHEN 5-325 MG PO TABS: 1.0000 | ORAL_TABLET | Freq: Four times a day (QID) | ORAL | 0 refills | Status: DC | PRN

## 2019-10-10 MED ORDER — FENTANYL CITRATE (PF) 100 MCG/2ML IJ SOLN
50.0000 ug | Freq: Once | INTRAMUSCULAR | Status: AC
  Administered 2019-10-10: 50 ug via INTRAVENOUS
  Filled 2019-10-10: qty 2
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
## 2019-10-10 NOTE — Discharge Instructions (Addendum)
Follow-up with your primary care doctor next week for reevaluation.  You do have an aneurysm of your thoracic aorta which will need to be followed periodically to assess for change.  Return here as needed if you have any worsening symptoms.  You have a broken calcaneus (heel). Do not get the splint wet. Do not put any weight on this leg. Use your walker for ambulation. Follow-up with her orthopedist as discussed. Make sure to keep your leg elevated as much as possible.

## 2019-10-10 NOTE — ED Notes (Signed)
Off floor to X-Ray 
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
## 2019-10-10 NOTE — Progress Notes (Signed)
Orthopedic Tech Progress Note Patient Details:  Rachel [name] 12-10-1945 [id number]  Ortho Devices Type of Ortho Device: Short leg splint Ortho Device/Splint Location: RLE Ortho Device/Splint Interventions: Application   Post Interventions Patient Tolerated: Well Instructions Provided: Care of device   Velvie Thomaston E Kylan Veach 10/10/2019, 11:38 PM

## 2019-10-10 NOTE — ED Notes (Signed)
Pt returned from CT °

## 2019-10-11 ENCOUNTER — Other Ambulatory Visit: Payer: Self-pay | Admitting: *Deleted

## 2019-10-11 ENCOUNTER — Telehealth: Payer: Self-pay | Admitting: *Deleted

## 2019-10-11 DIAGNOSIS — I719 Aortic aneurysm of unspecified site, without rupture: Secondary | ICD-10-CM
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
## 2019-10-17 ENCOUNTER — Other Ambulatory Visit: Payer: Self-pay

## 2019-10-17 ENCOUNTER — Encounter: Payer: Self-pay | Admitting: Orthopaedic Surgery

## 2019-10-17 ENCOUNTER — Ambulatory Visit (INDEPENDENT_AMBULATORY_CARE_PROVIDER_SITE_OTHER): Payer: Medicare Other | Admitting: Orthopaedic Surgery

## 2019-10-17 DIAGNOSIS — S92001A Unspecified fracture of right calcaneus, initial encounter for closed fracture: Secondary | ICD-10-CM

## 2019-10-17 DIAGNOSIS — S92009A Unspecified fracture of unspecified calcaneus, initial encounter for closed fracture: Secondary | ICD-10-CM | POA: Insufficient documentation
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
## 2019-10-20 ENCOUNTER — Encounter (HOSPITAL_COMMUNITY): Payer: Self-pay | Admitting: Emergency Medicine

## 2019-10-20 ENCOUNTER — Other Ambulatory Visit: Payer: Self-pay

## 2019-10-20 ENCOUNTER — Emergency Department (HOSPITAL_COMMUNITY)
Admission: EM | Admit: 2019-10-20 | Discharge: 2019-10-20 | Disposition: A | Discharge status: Home / Self Care | Payer: Medicare Other | Attending: Emergency Medicine | Admitting: Emergency Medicine

## 2019-10-20 ENCOUNTER — Emergency Department (HOSPITAL_COMMUNITY): Payer: Medicare Other

## 2019-10-20 DIAGNOSIS — I10 Essential (primary) hypertension: Secondary | ICD-10-CM | POA: Insufficient documentation

## 2019-10-20 DIAGNOSIS — W182XXA Fall in (into) shower or empty bathtub, initial encounter: Secondary | ICD-10-CM | POA: Diagnosis not present

## 2019-10-20 DIAGNOSIS — S99921A Unspecified injury of right foot, initial encounter: Secondary | ICD-10-CM | POA: Diagnosis present

## 2019-10-20 DIAGNOSIS — Y93E1 Activity, personal bathing and showering: Secondary | ICD-10-CM | POA: Insufficient documentation

## 2019-10-20 DIAGNOSIS — Z79899 Other long term (current) drug therapy: Secondary | ICD-10-CM | POA: Insufficient documentation

## 2019-10-20 DIAGNOSIS — Y92012 Bathroom of single-family (private) house as the place of occurrence of the external cause: Secondary | ICD-10-CM | POA: Insufficient documentation

## 2019-10-20 DIAGNOSIS — Y998 Other external cause status: Secondary | ICD-10-CM | POA: Diagnosis not present

## 2019-10-20 DIAGNOSIS — S92001D Unspecified fracture of right calcaneus, subsequent encounter for fracture with routine healing: Secondary | ICD-10-CM | POA: Diagnosis not present

## 2019-10-20 DIAGNOSIS — W19XXXA Unspecified fall, initial encounter: Secondary | ICD-10-CM

## 2019-10-20 DIAGNOSIS — Z87891 Personal history of nicotine dependence: Secondary | ICD-10-CM | POA: Diagnosis not present

## 2019-10-20 DIAGNOSIS — S92001A Unspecified fracture of right calcaneus, initial encounter for closed fracture: Secondary | ICD-10-CM | POA: Diagnosis not present
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
## 2019-10-20 NOTE — Discharge Instructions (Addendum)
Your x-ray today shows a mild displacement of your heel fracture that has changed from your previous CT.  You will need to elevate your foot when possible.  Minimal weightbearing.  Take your pain medication as directed.  Call your orthopedic provider tomorrow to arrange follow-up.
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
## 2019-10-22 ENCOUNTER — Telehealth: Payer: Self-pay | Admitting: Radiology

## 2019-10-22 NOTE — Telephone Encounter (Signed)
Patient called and states that she fell in the bath and had to go to ED. She was told her calcaneal fracture is worse. Imaging on PACS. She was unable to come in to office in [place] due to transportation, so appt scheduled in [place] office on Thursday. Please advise if you would like to do something different.

## 2019-10-22 NOTE — Telephone Encounter (Signed)
I CALLED , REVIEWED XRAYS and discussed with her. Has appt thursday

## 2019-10-24 ENCOUNTER — Other Ambulatory Visit: Payer: Self-pay

## 2019-10-24 ENCOUNTER — Ambulatory Visit: Payer: Medicare Other | Admitting: Orthopaedic Surgery

## 2019-11-21 ENCOUNTER — Ambulatory Visit (INDEPENDENT_AMBULATORY_CARE_PROVIDER_SITE_OTHER): Payer: Medicare Other | Admitting: Orthopaedic Surgery

## 2019-11-21 ENCOUNTER — Ambulatory Visit: Payer: Self-pay

## 2019-11-21 ENCOUNTER — Other Ambulatory Visit: Payer: Self-pay

## 2019-11-21 ENCOUNTER — Encounter: Payer: Self-pay | Admitting: Orthopaedic Surgery

## 2019-11-21 VITALS — Ht 64.0 in | Wt 152.0 lb

## 2019-11-21 DIAGNOSIS — M25561 Pain in right knee: Secondary | ICD-10-CM | POA: Diagnosis not present

## 2019-11-21 DIAGNOSIS — M79671 Pain in right foot: Secondary | ICD-10-CM | POA: Diagnosis not present
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
## 2019-11-25 ENCOUNTER — Other Ambulatory Visit: Payer: Self-pay | Admitting: *Deleted

## 2019-11-25 DIAGNOSIS — I714 Abdominal aortic aneurysm, without rupture, unspecified: Secondary | ICD-10-CM

## 2019-11-26 ENCOUNTER — Telehealth: Payer: Self-pay | Admitting: Orthopaedic Surgery

## 2019-11-26 NOTE — Telephone Encounter (Signed)
Patient called. She would like a RX for a power wheelchair sent to [facility]. Their fax number is [phone]. They also will need demographics and notes faxed. Her call back number is [phone]

## 2019-11-26 NOTE — Telephone Encounter (Signed)
Please advise 

## 2019-11-28 ENCOUNTER — Telehealth: Payer: Self-pay | Admitting: Radiology

## 2019-11-28 NOTE — Telephone Encounter (Signed)
Patient called requesting rx for power wheelchair.    Please advise. CB for pt is [phone]

## 2019-11-28 NOTE — Telephone Encounter (Signed)
I called discussed insurance does not cover. She has manual W/C

## 2019-12-06 ENCOUNTER — Ambulatory Visit (INDEPENDENT_AMBULATORY_CARE_PROVIDER_SITE_OTHER): Payer: Medicare Other | Admitting: Vascular Surgery

## 2019-12-06 ENCOUNTER — Encounter: Payer: Self-pay | Admitting: Vascular Surgery

## 2019-12-06 ENCOUNTER — Ambulatory Visit (HOSPITAL_COMMUNITY)
Admission: RE | Admit: 2019-12-06 | Discharge: 2019-12-06 | Disposition: A | Discharge status: Home / Self Care | Payer: Medicare Other | Source: Ambulatory Visit | Attending: Vascular Surgery | Admitting: Vascular Surgery

## 2019-12-06 ENCOUNTER — Other Ambulatory Visit: Payer: Self-pay

## 2019-12-06 VITALS — BP 159/99 | HR 61 | Temp 97.8°F | Resp 20 | Ht 64.0 in | Wt 152.0 lb

## 2019-12-06 DIAGNOSIS — I714 Abdominal aortic aneurysm, without rupture, unspecified: Secondary | ICD-10-CM

## 2019-12-06 DIAGNOSIS — I712 Thoracic aortic aneurysm, without rupture, unspecified: Secondary | ICD-10-CM

## 2019-12-06 DIAGNOSIS — I700 Atherosclerosis of aorta: Secondary | ICD-10-CM
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
## 2019-12-10 ENCOUNTER — Other Ambulatory Visit: Payer: Self-pay | Admitting: Family Medicine

## 2019-12-13 ENCOUNTER — Telehealth: Payer: Self-pay | Admitting: Family Medicine

## 2019-12-13 NOTE — Progress Notes (Signed)
  Chronic Care Management   Outreach Note  12/13/2019 Name: Rachel [name] MRN: [id number] DOB: Jul 21, 1945  Referred by: [name], MD Reason for referral : No chief complaint on file.   An unsuccessful telephone outreach was attempted today. The patient was referred to the pharmacist for assistance with care management and care coordination.   Follow Up Plan:   [place]

## 2019-12-26 ENCOUNTER — Ambulatory Visit: Payer: Self-pay

## 2019-12-26 ENCOUNTER — Ambulatory Visit (INDEPENDENT_AMBULATORY_CARE_PROVIDER_SITE_OTHER): Payer: Medicare Other | Admitting: Orthopaedic Surgery

## 2019-12-26 ENCOUNTER — Encounter: Payer: Self-pay | Admitting: Orthopaedic Surgery

## 2019-12-26 ENCOUNTER — Other Ambulatory Visit: Payer: Self-pay

## 2019-12-26 VITALS — Ht 64.0 in | Wt 152.0 lb

## 2019-12-26 DIAGNOSIS — S92001G Unspecified fracture of right calcaneus, subsequent encounter for fracture with delayed healing: Secondary | ICD-10-CM | POA: Diagnosis not present

## 2019-12-26 DIAGNOSIS — M79671 Pain in right foot: Secondary | ICD-10-CM | POA: Diagnosis not present

## 2019-12-26 NOTE — Progress Notes (Signed)
Office Visit Note   Patient: Rachel [name]           Date of Birth: 09-25-1945           MRN: [id number] Visit Date: 12/26/2019              Requested by: [name], MD [address] [place],  [place] [address] PCP: [name], MD   Assessment & Plan: Visit Diagnoses:  1. Pain of right heel   2. Closed nondisplaced fracture of right calcaneus with delayed healing, unspecified portion of calcaneus, subsequent encounter     Plan: Patient will go home and put the long cam walker on and she can just touchdown weight-bear using her walker.  She will return 5 weeks repeat x-rays on return .   Follow-Up Instructions: No follow-ups on file.   Orders:  Orders Placed This Encounter  Procedures   XR Os Calcis Right   No orders of the defined types were placed in this encounter.     Procedures: No procedures performed   Clinical Data: No additional findings.   Subjective: Chief Complaint  Patient presents with   Right Foot - Follow-up, Fracture    Fall x 2  10/10/2019 and 10/20/2019    HPI follow-up calcaneus fracture from fall 10/10/2019 with further displacement patient was noncompliant and was weightbearing as tolerated despite being told nonweightbearing.  She has been in a cast nonweightbearing since I last saw her a month ago and states she has been compliant.    Review of Systems updated unchanged.   Objective: Vital Signs: Ht 5\' 4"  (1.626 m)    Wt 152 lb (68.9 kg)    BMI 26.09 kg/m   Physical Exam Constitutional:      Appearance: She is well-developed.  HENT:     Head: Normocephalic.     Right Ear: External ear normal.     Left Ear: External ear normal.  Eyes:     Pupils: Pupils are equal, round, and reactive to light.  Neck:     Thyroid: No thyromegaly.     Trachea: No tracheal deviation.  Cardiovascular:     Rate and Rhythm: Normal rate.  Pulmonary:     Effort: Pulmonary effort is normal.  Abdominal:     Palpations: Abdomen is  soft.  Skin:    General: Skin is warm and dry.  Neurological:     Mental Status: She is alert and oriented to person, place, and time.  Psychiatric:        Behavior: Behavior normal.     Ortho Exam patient has mild tenderness at the fracture site.  Swelling is down the forefoot.  Specialty Comments:  No specialty comments available.  Imaging: No results found.   PMFS History: Patient Active Problem List   Diagnosis Date Noted   Calcaneus fracture 10/17/2019   Hyperlipidemia 07/31/2019   S/P lumbar fusion 02/21/2019   DDD (degenerative disc disease), lumbar 07/06/2018   Osteoporosis 04/24/2018   Encounter for gynecological examination with Papanicolaou smear of cervix 09/08/2015   Abnormal Pap smear of vagina 10/15/2014   Acquired trigger finger 09/03/2014   Constipation 04/04/2014   VAIN (vaginal intraepithelial neoplasia) 06/20/2013   Knee pain, right 06/14/2013   Allergic rhinitis 08/22/2012   Neuropathy, arm 02/19/2012   GERD (gastroesophageal reflux disease) 02/19/2012   Alopecia 02/19/2012   Anxiety 10/03/2011   Tremor 10/03/2011   Tobacco use 10/03/2011   OA (osteoarthritis) 08/11/2011  Cancer ([place]) 08/11/2011   Essential hypertension, benign 08/09/2011   Vitamin D deficiency 08/09/2011   Past Medical History:  Diagnosis Date   Back pain    Carpal tunnel syndrome    Constipation    Hypertension    Low grade squamous intraepith lesion on cytologic smear cervix (lgsil)    +hpv, HAD HYSTERECTOMY   OA (osteoarthritis)     Family History  Problem Relation Age of Onset   Diabetes Father    Cancer Father        prostate cancer   Cancer Sister        breast   Hypertension Son    Cancer Maternal Grandmother    Eczema Daughter    Other Daughter        stomach issues    Past Surgical History:  Procedure Laterality Date   ABDOMINAL HYSTERECTOMY  2006   BREAST CYST EXCISION     right   BUNIONECTOMY     left    CARPAL TUNNEL RELEASE Right    ESOPHAGOGASTRODUODENOSCOPY  06/13/2012   Procedure: ESOPHAGOGASTRODUODENOSCOPY (EGD);  Surgeon: Rogene Houston, MD;  [facility];  Service: Endoscopy;  Laterality: N/A;  200   EYE SURGERY Bilateral 2020   TRIGGER FINGER RELEASE Right 08/25/2016   Procedure: RELEASE TRIGGER FINGER/A-1 PULLEY RIGHT LONG TRIGGER FINGER RELEASE;  Surgeon: Carole Civil, MD;  Location: [facility];  Service: Orthopedics;  Laterality: Right;   Social History   Occupational History   Not on file  Tobacco Use   Smoking status: Former Smoker    Packs/day: 0.25    Years: 50.00    Pack years: 12.50    Types: Cigarettes    Quit date: 07/28/2013    Years since quitting: 6.4   Smokeless tobacco: Never Used  Vaping Use   Vaping Use: Every day   Substances: Nicotine  Substance and Sexual Activity   Alcohol use: Yes    Alcohol/week: 0.0 standard drinks    Comment: occ   Drug use: No   Sexual activity: Not Currently    Birth control/protection: Surgical    Comment: hyst

## 2020-01-11 ENCOUNTER — Other Ambulatory Visit: Payer: Self-pay | Admitting: Family Medicine

## 2020-01-20 ENCOUNTER — Encounter: Payer: Medicare Other | Admitting: Cardiothoracic Surgery

## 2020-01-23 ENCOUNTER — Encounter: Payer: Self-pay | Admitting: *Deleted

## 2020-01-30 ENCOUNTER — Telehealth: Payer: Self-pay | Admitting: Family Medicine

## 2020-01-30 ENCOUNTER — Other Ambulatory Visit: Payer: Self-pay

## 2020-01-30 ENCOUNTER — Ambulatory Visit: Payer: Self-pay

## 2020-01-30 ENCOUNTER — Encounter: Payer: Self-pay | Admitting: Orthopaedic Surgery

## 2020-01-30 ENCOUNTER — Ambulatory Visit (INDEPENDENT_AMBULATORY_CARE_PROVIDER_SITE_OTHER): Payer: Medicare Other | Admitting: Orthopaedic Surgery

## 2020-01-30 VITALS — BP 160/103 | Ht 64.5 in | Wt 156.0 lb

## 2020-01-30 DIAGNOSIS — S92001G Unspecified fracture of right calcaneus, subsequent encounter for fracture with delayed healing: Secondary | ICD-10-CM | POA: Diagnosis not present

## 2020-01-30 NOTE — Progress Notes (Signed)
  Chronic Care Management   Outreach Note  01/30/2020 Name: ANETH SCHLAGEL MRN: [id number] DOB: 1945-06-30  Referred by: [name], MD Reason for referral : No chief complaint on file.   A second unsuccessful telephone outreach was attempted today. The patient was referred to pharmacist for assistance with care management and care coordination.  Follow Up Plan:   Carley Perdue [employer] Scheduler
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
## 2020-02-06 ENCOUNTER — Telehealth: Payer: Self-pay | Admitting: Family Medicine

## 2020-02-06 NOTE — Progress Notes (Signed)
°  Chronic Care Management   Note  02/06/2020 Name: EKATERINI CAPITANO MRN: [id number] DOB: [birth year]  EMIRETH COCKERHAM is a 74 y.o. year old female who is a primary care patient of [place], Modena Nunnery, MD. I reached out to [name] by phone today in response to a referral sent by Ms. Patrick [place]'s PCP, Buelah Manis Modena Nunnery, MD.   Ms. Drummonds was given information about Chronic Care Management services today including:  1. CCM service includes personalized support from designated clinical staff supervised by her physician, including individualized plan of care and coordination with other care providers 2. 24/7 contact phone numbers for assistance for urgent and routine care needs. 3. Service will only be billed when office clinical staff spend 20 minutes or more in a month to coordinate care. 4. Only one practitioner may furnish and bill the service in a calendar month. 5. The patient may stop CCM services at any time (effective at the end of the month) by phone call to the office staff.   Patient agreed to services and verbal consent obtained.   Follow up plan:   Carley Perdue [employer] Scheduler

## 2020-02-27 ENCOUNTER — Telehealth: Payer: Self-pay

## 2020-02-27 NOTE — Telephone Encounter (Signed)
Patient unclear as to why she was seeing [facility]. Told patient they would be seeing her for a TAA and we would f/u in a year for her AAA.Confirmed [facility] appt times with patient and she verbalized understanding.

## 2020-03-02 ENCOUNTER — Encounter: Payer: Medicare Other | Admitting: Cardiothoracic Surgery

## 2020-03-09 ENCOUNTER — Institutional Professional Consult (permissible substitution): Payer: Medicare Other | Admitting: Cardiothoracic Surgery

## 2020-03-09 ENCOUNTER — Other Ambulatory Visit: Payer: Self-pay

## 2020-03-09 ENCOUNTER — Ambulatory Visit (INDEPENDENT_AMBULATORY_CARE_PROVIDER_SITE_OTHER): Payer: Medicare Other

## 2020-03-09 ENCOUNTER — Encounter: Payer: Self-pay | Admitting: Cardiothoracic Surgery

## 2020-03-09 VITALS — BP 181/103 | HR 61 | Temp 97.5°F | Resp 18 | Ht 64.5 in | Wt 164.0 lb

## 2020-03-09 DIAGNOSIS — I712 Thoracic aortic aneurysm, without rupture, unspecified: Secondary | ICD-10-CM

## 2020-03-09 DIAGNOSIS — E559 Vitamin D deficiency, unspecified: Secondary | ICD-10-CM

## 2020-03-09 DIAGNOSIS — M810 Age-related osteoporosis without current pathological fracture: Secondary | ICD-10-CM

## 2020-03-09 MED ORDER — DENOSUMAB 60 MG/ML ~~LOC~~ SOSY
60.0000 mg | PREFILLED_SYRINGE | Freq: Once | SUBCUTANEOUS | Status: AC
  Administered 2020-03-09: 60 mg via SUBCUTANEOUS
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
## 2020-03-12 ENCOUNTER — Ambulatory Visit: Payer: Self-pay

## 2020-03-12 ENCOUNTER — Ambulatory Visit (INDEPENDENT_AMBULATORY_CARE_PROVIDER_SITE_OTHER): Payer: Medicare Other | Admitting: Orthopaedic Surgery

## 2020-03-12 ENCOUNTER — Encounter: Payer: Self-pay | Admitting: Orthopaedic Surgery

## 2020-03-12 VITALS — Ht 64.5 in | Wt 155.0 lb

## 2020-03-12 DIAGNOSIS — S92001G Unspecified fracture of right calcaneus, subsequent encounter for fracture with delayed healing: Secondary | ICD-10-CM | POA: Diagnosis not present
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
## 2020-03-23 ENCOUNTER — Telehealth: Payer: Self-pay | Admitting: Pharmacist
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
## 2020-03-24 ENCOUNTER — Ambulatory Visit: Payer: Medicare Other

## 2020-03-24 ENCOUNTER — Other Ambulatory Visit: Payer: Self-pay

## 2020-03-24 DIAGNOSIS — I10 Essential (primary) hypertension: Secondary | ICD-10-CM

## 2020-03-24 DIAGNOSIS — E785 Hyperlipidemia, unspecified: Secondary | ICD-10-CM

## 2020-03-24 MED ORDER — LISINOPRIL 20 MG PO TABS: 20.0000 mg | ORAL_TABLET | Freq: Every day | ORAL | 3 refills | Status: DC

## 2020-03-24 NOTE — Patient Instructions (Addendum)
Visit Information Thank you for meeting with me today!  I look forward to working with you to help you meet all of your healthcare goals and answer any questions you may have.  Feel free to contact me anytime!  Goals Addressed            This Visit's Progress   . Pharmacy Care Plan:       CARE PLAN ENTRY (see longitudinal plan of care for additional care plan information)  Current Barriers:  . Chronic Disease Management support, education, and care coordination needs related to Hypertension and Hyperlipidemia   Hypertension BP Readings from Last 3 Encounters:  03/09/20 (!) 181/103  01/30/20 (!) 160/103  12/06/19 (!) 159/99   . Pharmacist Clinical Goal(s): o Over the next 14 days, patient will work with PharmD and providers to achieve BP goal <140/90 . Current regimen:  o Lisinopril 20mg  o Propranolol ER 60mg  . Interventions: o Identified medication by pill image and discovered patient not taking lisinopril 20mg . o Rx for lisinopril called in to local pharmacy for immediate pickup. o Discussed home monitoring. o Regular follow up initiated. . Patient self care activities - Over the next 14 days, patient will: o Pickup prescription for lisinopril 20mg  and begin taking each evening. o Discontinue taking the "little blue pill" propranolol 20mg . o Check BP daily, document, and provide at future appointments o Ensure daily salt intake < 2300 mg/day  Hyperlipidemia Lab Results  Component Value Date/Time   LDLCALC 110 (H) 07/31/2019 04:00 PM   . Pharmacist Clinical Goal(s): o Over the next 90 days, patient will work with PharmD and providers to achieve LDL goal < 100 . Current regimen:  o No medications . Interventions: o Reviewed most recent lipid panel . Patient self care activities - Over the next 90 days, patient will: o Continue to work on dietary changes limiting saturated fats and processed foods to promote better cholesterol.   Initial goal documentation         Ms. Hoctor was given information about Chronic Care Management services today including:  1. CCM service includes personalized support from designated clinical staff supervised by her physician, including individualized plan of care and coordination with other care providers 2. 24/7 contact phone numbers for assistance for urgent and routine care needs. 3. Standard insurance, coinsurance, copays and deductibles apply for chronic care management only during months in which we provide at least 20 minutes of these services. Most insurances cover these services at 100%, however patients may be responsible for any copay, coinsurance and/or deductible if applicable. This service may help you avoid the need for more expensive face-to-face services. 4. Only one practitioner may furnish and bill the service in a calendar month. 5. The patient may stop CCM services at any time (effective at the end of the month) by phone call to the office staff.  Patient agreed to services and verbal consent obtained.   The patient verbalized understanding of instructions provided today and agreed to receive a mailed copy of patient instruction and/or educational materials. Telephone follow up appointment with pharmacy team member scheduled for: 2 weeks  Beverly Milch, PharmD Clinical Pharmacist [facility] [phone]   Hypertension, Adult Hypertension is another name for high blood pressure. High blood pressure forces your heart to work harder to pump blood. This can cause problems over time. There are two numbers in a blood pressure reading. There is a top number (systolic) over a bottom number (diastolic). It is best  to have a blood pressure that is below 120/80. Healthy choices can help lower your blood pressure, or you may need medicine to help lower it. What are the causes? The cause of this condition is not known. Some conditions may be related to high blood pressure. What increases  the risk?  Smoking.  Having type 2 diabetes mellitus, high cholesterol, or both.  Not getting enough exercise or physical activity.  Being overweight.  Having too much fat, sugar, calories, or salt (sodium) in your diet.  Drinking too much alcohol.  Having long-term (chronic) kidney disease.  Having a family history of high blood pressure.  Age. Risk increases with age.  Race. You may be at higher risk if you are African American.  Gender. Men are at higher risk than women before age 49. After age 4, women are at higher risk than men.  Having obstructive sleep apnea.  Stress. What are the signs or symptoms?  High blood pressure may not cause symptoms. Very high blood pressure (hypertensive crisis) may cause: ? Headache. ? Feelings of worry or nervousness (anxiety). ? Shortness of breath. ? Nosebleed. ? A feeling of being sick to your stomach (nausea). ? Throwing up (vomiting). ? Changes in how you see. ? Very bad chest pain. ? Seizures. How is this treated?  This condition is treated by making healthy lifestyle changes, such as: ? Eating healthy foods. ? Exercising more. ? Drinking less alcohol.  Your health care provider may prescribe medicine if lifestyle changes are not enough to get your blood pressure under control, and if: ? Your top number is above 130. ? Your bottom number is above 80.  Your personal target blood pressure may vary. Follow these instructions at home: Eating and drinking   If told, follow the DASH eating plan. To follow this plan: ? Fill one half of your plate at each meal with fruits and vegetables. ? Fill one fourth of your plate at each meal with whole grains. Whole grains include whole-wheat pasta, brown rice, and whole-grain bread. ? Eat or drink low-fat dairy products, such as skim milk or low-fat yogurt. ? Fill one fourth of your plate at each meal with low-fat (lean) proteins. Low-fat proteins include fish, chicken without  skin, eggs, beans, and tofu. ? Avoid fatty meat, cured and processed meat, or chicken with skin. ? Avoid pre-made or processed food.  Eat less than 1,500 mg of salt each day.  Do not drink alcohol if: ? Your doctor tells you not to drink. ? You are pregnant, may be pregnant, or are planning to become pregnant.  If you drink alcohol: ? Limit how much you use to:  0-1 drink a day for women.  0-2 drinks a day for men. ? Be aware of how much alcohol is in your drink. In the [place] one drink equals one 12 oz bottle of beer (355 mL), one 5 oz glass of wine (148 mL), or one 1 oz glass of hard liquor (44 mL). Lifestyle   Work with your doctor to stay at a healthy weight or to lose weight. Ask your doctor what the best weight is for you.  Get at least 30 minutes of exercise most days of the week. This may include walking, swimming, or biking.  Get at least 30 minutes of exercise that strengthens your muscles (resistance exercise) at least 3 days a week. This may include lifting weights or doing Pilates.  Do not use any products that contain nicotine or tobacco,  such as cigarettes, e-cigarettes, and chewing tobacco. If you need help quitting, ask your doctor.  Check your blood pressure at home as told by your doctor.  Keep all follow-up visits as told by your doctor. This is important. Medicines  Take over-the-counter and prescription medicines only as told by your doctor. Follow directions carefully.  Do not skip doses of blood pressure medicine. The medicine does not work as well if you skip doses. Skipping doses also puts you at risk for problems.  Ask your doctor about side effects or reactions to medicines that you should watch for. Contact a doctor if you:  Think you are having a reaction to the medicine you are taking.  Have headaches that keep coming back (recurring).  Feel dizzy.  Have swelling in your ankles.  Have trouble with your vision. Get help right away if  you:  Get a very bad headache.  Start to feel mixed up (confused).  Feel weak or numb.  Feel faint.  Have very bad pain in your: ? Chest. ? Belly (abdomen).  Throw up more than once.  Have trouble breathing. Summary  Hypertension is another name for high blood pressure.  High blood pressure forces your heart to work harder to pump blood.  For most people, a normal blood pressure is less than 120/80.  Making healthy choices can help lower blood pressure. If your blood pressure does not get lower with healthy choices, you may need to take medicine. This information is not intended to replace advice given to you by your health care provider. Make sure you discuss any questions you have with your health care provider. Document Revised: 01/24/2018 Document Reviewed: 01/24/2018 Elsevier Patient Education  2020 [employer].

## 2020-03-26 ENCOUNTER — Other Ambulatory Visit: Payer: Self-pay

## 2020-03-30 ENCOUNTER — Other Ambulatory Visit: Payer: Self-pay | Admitting: *Deleted

## 2020-03-31 ENCOUNTER — Other Ambulatory Visit: Payer: Self-pay | Admitting: *Deleted

## 2020-03-31 DIAGNOSIS — K219 Gastro-esophageal reflux disease without esophagitis: Secondary | ICD-10-CM

## 2020-03-31 DIAGNOSIS — E785 Hyperlipidemia, unspecified: Secondary | ICD-10-CM

## 2020-03-31 DIAGNOSIS — I10 Essential (primary) hypertension: Secondary | ICD-10-CM

## 2020-03-31 DIAGNOSIS — E559 Vitamin D deficiency, unspecified: Secondary | ICD-10-CM

## 2020-03-31 DIAGNOSIS — Z981 Arthrodesis status: Secondary | ICD-10-CM

## 2020-03-31 DIAGNOSIS — M810 Age-related osteoporosis without current pathological fracture: Secondary | ICD-10-CM

## 2020-03-31 DIAGNOSIS — I719 Aortic aneurysm of unspecified site, without rupture: Secondary | ICD-10-CM

## 2020-04-05 ENCOUNTER — Other Ambulatory Visit: Payer: Self-pay | Admitting: Family Medicine

## 2020-04-07 ENCOUNTER — Ambulatory Visit: Payer: Self-pay | Admitting: Pharmacist

## 2020-04-07 DIAGNOSIS — E785 Hyperlipidemia, unspecified: Secondary | ICD-10-CM

## 2020-04-07 DIAGNOSIS — I10 Essential (primary) hypertension: Secondary | ICD-10-CM
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
## 2020-04-07 NOTE — Patient Instructions (Addendum)
Visit Information  Goals Addressed            This Visit's Progress   . Pharmacy Care Plan:       CARE PLAN ENTRY (see longitudinal plan of care for additional care plan information)  Current Barriers:  . Chronic Disease Management support, education, and care coordination needs related to Hypertension and Hyperlipidemia   Hypertension BP Readings from Last 3 Encounters:  03/09/20 (!) 181/103  01/30/20 (!) 160/103  12/06/19 (!) 159/99   . Pharmacist Clinical Goal(s): o Over the next 60 days, patient will work with PharmD and providers to achieve BP goal <140/90 . Current regimen:  o Lisinopril 20mg  o Propranolol ER 60mg  . Interventions: o Discussed home monitoring. o Regular follow up initiated, CMA in 30 days. o Counseled on medication adherence. . Patient self care activities - Over the next 60 days, patient will: o Check BP daily, document, and provide at future appointments o Ensure daily salt intake < 2300 mg/day o Continue to take Lisinopril 20mg  in the morning and propranolol ER 60mg  in the evening. Contact providers if BP remains > 140/90 in two weeks.  Hyperlipidemia Lab Results  Component Value Date/Time   LDLCALC 110 (H) 07/31/2019 04:00 PM   . Pharmacist Clinical Goal(s): o Over the next 60 days, patient will work with PharmD and providers to achieve LDL goal < 100 . Current regimen:  o No medications . Interventions: o Reviewed most recent lipid panel o Discussed dietary modifications o Recommended PCP visit for lipid panel . Patient self care activities - Over the next 60 days, patient will: o Continue to work on dietary changes limiting saturated fats and processed foods to promote better cholesterol, limit fried foods. o Schedule visit with PCP for lipid panel.   Initial goal documentation        The patient verbalized understanding of instructions provided today and agreed to receive a mailed copy of patient instruction and/or educational  materials.  Telephone follow up appointment with pharmacy team member scheduled for: 56 days  Beverly Milch, PharmD Clinical Pharmacist [facility] [phone]   Preventing High Cholesterol Cholesterol is a white, waxy substance similar to fat that the human body needs to help build cells. The liver makes all the cholesterol that a person's body needs. Having high cholesterol (hypercholesterolemia) increases a person's risk for heart disease and stroke. Extra (excess) cholesterol comes from the food the person eats. High cholesterol can often be prevented with diet and lifestyle changes. If you already have high cholesterol, you can control it with diet and lifestyle changes and with medicine. How can high cholesterol affect me? If you have high cholesterol, deposits (plaques) may build up on the walls of your arteries. The arteries are the blood vessels that carry blood away from your heart. Plaques make the arteries narrower and stiffer. This can limit or block blood flow and cause blood clots to form. Blood clots:  Are tiny balls of cells that form in your blood.  Can move to the heart or brain, causing a heart attack or stroke. Plaques in arteries greatly increase your risk for heart attack and stroke.Making diet and lifestyle changes can reduce your risk for these conditions that may threaten your life. What can increase my risk? This condition is more likely to develop in people who:  Eat foods that are high in saturated fat or cholesterol. Saturated fat is mostly found in: ? Foods that contain animal fat, such as red  meat and some dairy products. ? Certain fatty foods made from plants, such as tropical oils.  Are overweight.  Are not getting enough exercise.  Have a family history of high cholesterol. What actions can I take to prevent this? Nutrition   Eat less saturated fat.  Avoid trans fats (partially hydrogenated oils). These are often found in  margarine and in some baked goods, fried foods, and snacks bought in packages.  Avoid precooked or cured meat, such as sausages or meat loaves.  Avoid foods and drinks that have added sugars.  Eat more fruits, vegetables, and whole grains.  Choose healthy sources of protein, such as fish, poultry, lean cuts of red meat, beans, peas, lentils, and nuts.  Choose healthy sources of fat, such as: ? Nuts. ? Vegetable oils, especially olive oil. ? Fish that have healthy fats (omega-3 fatty acids), such as mackerel or salmon. The items listed above may not be a complete list of recommended foods and beverages. Contact a dietitian for more information. Lifestyle  Lose weight if you are overweight. Losing 5-10 lb (2.3-4.5 kg) can help prevent or control high cholesterol. It can also lower your risk for diabetes and high blood pressure. Ask your health care provider to help you with a diet and exercise plan to lose weight safely.  Do not use any products that contain nicotine or tobacco, such as cigarettes, e-cigarettes, and chewing tobacco. If you need help quitting, ask your health care provider.  Limit your alcohol intake. ? Do not drink alcohol if:  Your health care provider tells you not to drink.  You are pregnant, may be pregnant, or are planning to become pregnant. ? If you drink alcohol:  Limit how much you use to:  0-1 drink a day for women.  0-2 drinks a day for men.  Be aware of how much alcohol is in your drink. In the [place] one drink equals one 12 oz bottle of beer (355 mL), one 5 oz glass of wine (148 mL), or one 1 oz glass of hard liquor (44 mL). Activity   Get enough exercise. Each week, do at least 150 minutes of exercise that takes a medium level of effort (moderate-intensity exercise). ? This is exercise that:  Makes your heart beat faster and makes you breathe harder than usual.  Allows you to still be able to talk. ? You could exercise in short sessions  several times a day or longer sessions a few times a week. For example, on 5 days each week, you could walk fast or ride your bike 3 times a day for 10 minutes each time.  Do exercises as told by your health care provider. Medicines  In addition to diet and lifestyle changes, your health care provider may recommend medicines to help lower cholesterol. This may be a medicine to lower the amount of cholesterol your liver makes. You may need medicine if: ? Diet and lifestyle changes do not lower your cholesterol enough. ? You have high cholesterol and other risk factors for heart disease or stroke.  Take over-the-counter and prescription medicines only as told by your health care provider. General information  Manage your risk factors for high cholesterol. Talk with your health care provider about all your risk factors and how to lower your risk.  Manage other conditions that you have, such as diabetes or high blood pressure (hypertension).  Have blood tests to check your cholesterol levels at regular points in time as told by your health care  provider.  Keep all follow-up visits as told by your health care provider. This is important. Where to find more information  American Heart Association: www.heart.org  National Heart, Lung, and Blood Institute: https://wilson-eaton.com/ Summary  High cholesterol increases your risk for heart disease and stroke. By keeping your cholesterol level low, you can reduce your risk for these conditions.  High cholesterol can often be prevented with diet and lifestyle changes.  Work with your health care provider to manage your risk factors, and have your blood tested regularly. This information is not intended to replace advice given to you by your health care provider. Make sure you discuss any questions you have with your health care provider. Document Revised: 09/07/2018 Document Reviewed: 01/23/2016 Elsevier Patient Education  2020 [employer].

## 2020-04-09 ENCOUNTER — Ambulatory Visit: Payer: Medicare Other | Attending: Internal Medicine

## 2020-04-09 DIAGNOSIS — R6889 Other general symptoms and signs: Secondary | ICD-10-CM | POA: Diagnosis not present

## 2020-04-09 DIAGNOSIS — Z23 Encounter for immunization: Secondary | ICD-10-CM

## 2020-04-09 NOTE — Progress Notes (Signed)
   Covid-19 Vaccination Clinic  Name:  Rachel [name]    MRN: [id number] DOB: 1945/10/23  04/09/2020  Rachel [name] was observed post Covid-19 immunization for 15 minutes without incident. She was provided with Vaccine Information Sheet and instruction to access the V-Safe system.   Rachel [name] was instructed to call 911 with any severe reactions post vaccine: Marland Kitchen Difficulty breathing  . Swelling of face and throat  . A fast heartbeat  . A bad rash all over body  . Dizziness and weakness

## 2020-04-17 ENCOUNTER — Ambulatory Visit (INDEPENDENT_AMBULATORY_CARE_PROVIDER_SITE_OTHER): Payer: Medicare Other | Admitting: Family Medicine

## 2020-04-17 ENCOUNTER — Other Ambulatory Visit: Payer: Self-pay

## 2020-04-17 VITALS — BP 160/86 | HR 67 | Temp 98.5°F | Ht 64.5 in | Wt 161.6 lb

## 2020-04-17 DIAGNOSIS — K219 Gastro-esophageal reflux disease without esophagitis: Secondary | ICD-10-CM | POA: Diagnosis not present

## 2020-04-17 DIAGNOSIS — M47812 Spondylosis without myelopathy or radiculopathy, cervical region: Secondary | ICD-10-CM

## 2020-04-17 DIAGNOSIS — M25512 Pain in left shoulder: Secondary | ICD-10-CM | POA: Diagnosis not present

## 2020-04-17 DIAGNOSIS — R6889 Other general symptoms and signs: Secondary | ICD-10-CM | POA: Diagnosis not present

## 2020-04-17 DIAGNOSIS — I10 Essential (primary) hypertension: Secondary | ICD-10-CM | POA: Diagnosis not present

## 2020-04-17 DIAGNOSIS — G8929 Other chronic pain: Secondary | ICD-10-CM | POA: Diagnosis not present

## 2020-04-17 MED ORDER — TRAMADOL HCL 50 MG PO TABS
50.0000 mg | ORAL_TABLET | Freq: Three times a day (TID) | ORAL | 0 refills | Status: AC | PRN
Start: 2020-04-17 — End: 2020-04-22

## 2020-04-17 MED ORDER — LISINOPRIL 20 MG PO TABS: 40.0000 mg | ORAL_TABLET | Freq: Every day | ORAL | 3 refills | Status: DC
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
## 2020-04-17 NOTE — Patient Instructions (Addendum)
Referral To Dr. [name] for for neck and shoulder  Take Tylenol 500mg   for pain or use the ultram ( I sent to pharmacy) Increase lisinopril 40mg  once a day, continue propranolol  Call in 2 weeks with blood pressure readings

## 2020-04-18 ENCOUNTER — Encounter: Payer: Self-pay | Admitting: Family Medicine

## 2020-04-18 LAB — CBC WITH DIFFERENTIAL/PLATELET
Absolute Monocytes: 640 cells/uL (ref 200–950)
Basophils Absolute: 79 cells/uL (ref 0–200)
Basophils Relative: 1.2 %
Eosinophils Absolute: 59 cells/uL (ref 15–500)
Eosinophils Relative: 0.9 %
HCT: 41.4 % (ref 35.0–45.0)
Hemoglobin: 13.5 g/dL (ref 11.7–15.5)
Lymphs Abs: 1835 cells/uL (ref 850–3900)
MCH: 28.4 pg (ref 27.0–33.0)
MCHC: 32.6 g/dL (ref 32.0–36.0)
MCV: 87.2 fL (ref 80.0–100.0)
MPV: 12.5 fL (ref 7.5–12.5)
Monocytes Relative: 9.7 %
Neutro Abs: 3986 cells/uL (ref 1500–7800)
Neutrophils Relative %: 60.4 %
Platelets: 245 10*3/uL (ref 140–400)
RBC: 4.75 10*6/uL (ref 3.80–5.10)
RDW: 13.7 % (ref 11.0–15.0)
Total Lymphocyte: 27.8 %
WBC: 6.6 10*3/uL (ref 3.8–10.8)

## 2020-04-18 LAB — COMPREHENSIVE METABOLIC PANEL
AG Ratio: 1.4 (calc) (ref 1.0–2.5)
ALT: 16 U/L (ref 6–29)
AST: 19 U/L (ref 10–35)
Albumin: 4.6 g/dL (ref 3.6–5.1)
Alkaline phosphatase (APISO): 53 U/L (ref 37–153)
BUN/Creatinine Ratio: 14 (calc) (ref 6–22)
BUN: 16 mg/dL (ref 7–25)
CO2: 25 mmol/L (ref 20–32)
Calcium: 10.2 mg/dL (ref 8.6–10.4)
Chloride: 105 mmol/L (ref 98–110)
Creat: 1.14 mg/dL — ABNORMAL HIGH (ref 0.60–0.93)
Globulin: 3.2 g/dL (calc) (ref 1.9–3.7)
Glucose, Bld: 94 mg/dL (ref 65–99)
Potassium: 4.2 mmol/L (ref 3.5–5.3)
Sodium: 140 mmol/L (ref 135–146)
Total Bilirubin: 0.6 mg/dL (ref 0.2–1.2)
Total Protein: 7.8 g/dL (ref 6.1–8.1)

## 2020-04-18 LAB — TSH: TSH: 0.84 mIU/L (ref 0.40–4.50)

## 2020-04-18 NOTE — Assessment & Plan Note (Signed)
Continue PPI ?

## 2020-04-18 NOTE — Assessment & Plan Note (Addendum)
Uncontrolled Increase lisinopril to 40mg  once a day Check BMET today Continue propranolol Reduce sodium in diet  Send readings in 2 weeks

## 2020-04-29 ENCOUNTER — Other Ambulatory Visit: Payer: Self-pay | Admitting: *Deleted

## 2020-04-29 DIAGNOSIS — I10 Essential (primary) hypertension: Secondary | ICD-10-CM

## 2020-04-29 DIAGNOSIS — E785 Hyperlipidemia, unspecified: Secondary | ICD-10-CM

## 2020-04-29 DIAGNOSIS — N179 Acute kidney failure, unspecified: Secondary | ICD-10-CM

## 2020-05-08 ENCOUNTER — Other Ambulatory Visit: Payer: Self-pay | Admitting: Cardiothoracic Surgery

## 2020-05-08 DIAGNOSIS — I712 Thoracic aortic aneurysm, without rupture, unspecified: Secondary | ICD-10-CM

## 2020-05-14 ENCOUNTER — Ambulatory Visit: Payer: Medicare Other | Admitting: Orthopaedic Surgery

## 2020-05-25 ENCOUNTER — Telehealth: Payer: Self-pay | Admitting: Pharmacist

## 2020-05-25 NOTE — Chronic Care Management (AMB) (Addendum)
Chronic Care Management Pharmacy Assistant   Name: Rachel [name]  MRN: [id number] DOB: [birth year]  Reason for Encounter: Disease State call for HTN.  Patient Questions:  1.  Have you seen any other providers since your last visit? No    2.  Any changes in your medicines or health? Yes.   PCP : [name], MD   Their chronic conditions include: HTN, Allergic Rhinitis, GERD, Osteoporosis, HLD.  Office Visit:  04/17/2020 with Dr. Buelah Manis, Patient was advised by Dr. Buelah Manis to take Tylenol 500 mg for pain or use ultram for pain. An referal was sent to Dr. [name] for neck and shoulder pain. Hydrocodone 5-325 mg was disconnected.Tramadol 50 mg and Lisinopril 40 mg was started.    Allergies:  No Known Allergies  Medications: Outpatient Encounter Medications as of 05/25/2020  Medication Sig   Ascorbic Acid (VITA-C PO) Take by mouth.   cholecalciferol (VITAMIN D3) 25 MCG (1000 UNIT) tablet Take 1,000 Units by mouth daily.   docusate sodium (COLACE) 100 MG capsule TAKE 1 CAPSULE BY MOUTH EVERY 12 HOURS (Patient taking differently: Take 100-200 mg by mouth daily as needed for mild constipation. )   lisinopril (ZESTRIL) 20 MG tablet Take 2 tablets (40 mg total) by mouth daily.   magnesium citrate solution Take 74-148 mLs by mouth daily as needed (constipation).   Multiple Vitamin (MULTIVITAMIN WITH MINERALS) TABS tablet Take 1 tablet by mouth daily.   pantoprazole (PROTONIX) 40 MG tablet TAKE 1 TABLET BY MOUTH  DAILY   propranolol ER (INDERAL LA) 60 MG 24 hr capsule TAKE 1 CAPSULE BY MOUTH  DAILY FOR BLOOD PRESSURE   vitamin E (VITAMIN E) 180 MG (400 UNITS) capsule Take 400 Units by mouth daily.   No facility-administered encounter medications on file as of 05/25/2020.    Current Diagnosis: Patient Active Problem List   Diagnosis Date Noted   Calcaneus fracture 10/17/2019   Hyperlipidemia 07/31/2019   S/P lumbar fusion 02/21/2019   DDD (degenerative disc disease), lumbar  07/06/2018   Osteoporosis 04/24/2018   Encounter for gynecological examination with Papanicolaou smear of cervix 09/08/2015   Abnormal Pap smear of vagina 10/15/2014   Acquired trigger finger 09/03/2014   Constipation 04/04/2014   VAIN (vaginal intraepithelial neoplasia) 06/20/2013   Knee pain, right 06/14/2013   Allergic rhinitis 08/22/2012   Neuropathy, arm 02/19/2012   GERD (gastroesophageal reflux disease) 02/19/2012   Alopecia 02/19/2012   Anxiety 10/03/2011   Tremor 10/03/2011   Tobacco use 10/03/2011   OA (osteoarthritis) 08/11/2011   Cancer ([place]) 08/11/2011   Essential hypertension, benign 08/09/2011   Vitamin D deficiency 08/09/2011    Goals Addressed   None    Reviewed chart prior to disease state call. Spoke with patient regarding BP  Recent Office Vitals: BP Readings from Last 3 Encounters:  04/17/20 (!) 160/86  03/09/20 (!) 181/103  01/30/20 (!) 160/103   Pulse Readings from Last 3 Encounters:  04/17/20 67  03/09/20 61  12/06/19 61    Wt Readings from Last 3 Encounters:  04/17/20 161 lb 9.6 oz (73.3 kg)  03/12/20 155 lb (70.3 kg)  03/09/20 164 lb (74.4 kg)     Kidney Function Lab Results  Component Value Date/Time   CREATININE 1.14 (H) 04/17/2020 04:23 PM   CREATININE 0.80 10/10/2019 05:46 PM   CREATININE 0.95 10/10/2019 05:42 PM   CREATININE 1.11 (H) 07/31/2019 04:00 PM   GFRNONAA 59 (L) 10/10/2019 05:42 PM   GFRAA >60 10/10/2019 05:42 PM  BMP Latest Ref Rng & Units 04/17/2020 10/10/2019 10/10/2019  Glucose 65 - 99 mg/dL 94 101(H) 100(H)  BUN 7 - 25 mg/dL 16 15 12   Creatinine 0.60 - 0.93 mg/dL 1.14(H) 0.80 0.95  BUN/Creat Ratio 6 - 22 (calc) 14 - -  Sodium 135 - 146 mmol/L 140 140 141  Potassium 3.5 - 5.3 mmol/L 4.2 3.8 4.6  Chloride 98 - 110 mmol/L 105 107 105  CO2 20 - 32 mmol/L 25 - 22  Calcium 8.6 - 10.4 mg/dL 10.2 - 9.6    Current antihypertensive regimen:  Lisinopril 40mg  Propranolol ER 60mg   How often are you checking your  Blood Pressure? twice daily   Current home BP readings: 05/24/20 161/108            05/24/20 144/91            05/23/20 157/101  What recent interventions/DTPs have been made by any provider to improve Blood Pressure control since last CPP Visit: An increase in lisinopril to 40 mg daily.   Any recent hospitalizations or ED visits since last visit with CPP? No   What diet changes have been made to improve Blood Pressure Control?  Patient stated she is drinking more water. Patient stated she is drinking small amounts of vinegar.  What exercise is being done to improve your Blood Pressure Control?  Patient stated she gets out of breath and gets tired easily so she doesn't currently exercise but she does do around the house duties like washing dishes, making the bed, etc.   Adherence Review:  Is the patient currently on ACE/ARB medication? No   Does the patient have >5 day gap between last estimated fill dates? No. CPP please confim.   Follow-Up:  Pharmacist Review   Charlann Lange, RMA Clinical Pharmacst Assistant [phone]  6 minutes spent in review, coordination, and documentation.  Reviewed by: Beverly Milch, PharmD Clinical Pharmacist [facility] [phone]

## 2020-05-28 ENCOUNTER — Ambulatory Visit: Payer: Self-pay

## 2020-05-28 ENCOUNTER — Ambulatory Visit (INDEPENDENT_AMBULATORY_CARE_PROVIDER_SITE_OTHER): Payer: Medicare Other | Admitting: Orthopaedic Surgery

## 2020-05-28 ENCOUNTER — Other Ambulatory Visit: Payer: Self-pay

## 2020-05-28 ENCOUNTER — Encounter: Payer: Self-pay | Admitting: Orthopaedic Surgery

## 2020-05-28 ENCOUNTER — Ambulatory Visit (INDEPENDENT_AMBULATORY_CARE_PROVIDER_SITE_OTHER): Payer: Medicare Other

## 2020-05-28 VITALS — Ht 64.5 in | Wt 161.0 lb

## 2020-05-28 DIAGNOSIS — M542 Cervicalgia: Secondary | ICD-10-CM

## 2020-05-28 DIAGNOSIS — M25512 Pain in left shoulder: Secondary | ICD-10-CM | POA: Diagnosis not present

## 2020-05-28 DIAGNOSIS — S92001G Unspecified fracture of right calcaneus, subsequent encounter for fracture with delayed healing: Secondary | ICD-10-CM | POA: Diagnosis not present

## 2020-05-28 DIAGNOSIS — R6889 Other general symptoms and signs: Secondary | ICD-10-CM | POA: Diagnosis not present

## 2020-05-28 NOTE — Progress Notes (Signed)
Office Visit Note   Patient: Rachel [name]           Date of Birth: 11/14/1945           MRN: [id number] Visit Date: 05/28/2020              Requested by: [name], MD [address] [place],  [place] [address] PCP: [name], MD   Assessment & Plan: Visit Diagnoses:  1. Closed nondisplaced fracture of right calcaneus with delayed healing, unspecified portion of calcaneus, subsequent encounter   2. Acute pain of left shoulder   3. Neck pain     Plan: Patient can work her way back into regular shoes.  We discussed doing some passive stretching of her arm using a rope of the top of the door and fingertip while walking.  We reviewed her neck radiographs which showed some spondylosis but she has no evidence of radiculopathy.  Return as needed. Follow-Up Instructions: No follow-ups on file.   Orders:  Orders Placed This Encounter  Procedures  . XR Cervical Spine 2 or 3 views  . XR Shoulder Left  . XR Os Calcis Right   No orders of the defined types were placed in this encounter.     Procedures: No procedures performed   Clinical Data: No additional findings.   Subjective: Chief Complaint  Patient presents with  . Neck - Pain  . Left Shoulder - Pain  . Right Heel - Follow-up, Fracture    HPI 74 year old female returns for follow-up of calcaneus fracture.  She was noncompliant and was weightbearing initially and had further displacement of the fracture but now it is gradually healed.  There is callus formation she can ambulate out of the boot without pain.  She is also noticed decreased range of motion of her left shoulder with difficulty abducting more than 90 degrees with stiffness.  Using her hand download does not seem to bother her she does not note any injury.  She is also noticed some stiffness in her neck decreased range of motion.  No numbness or tingling in her hands no upper extremity weakness other than with her left arm up  overhead.  Review of Systems all other systems noncontributory to HPI.   Objective: Vital Signs: Ht 5' 4.5" (1.638 m)   Wt 161 lb (73 kg)   BMI 27.21 kg/m   Physical Exam Constitutional:      Appearance: She is well-developed.  HENT:     Head: Normocephalic.     Right Ear: External ear normal.     Left Ear: External ear normal.  Eyes:     Pupils: Pupils are equal, round, and reactive to light.  Neck:     Thyroid: No thyromegaly.     Trachea: No tracheal deviation.  Cardiovascular:     Rate and Rhythm: Normal rate.  Pulmonary:     Effort: Pulmonary effort is normal.  Abdominal:     Palpations: Abdomen is soft.  Skin:    General: Skin is warm and dry.  Neurological:     Mental Status: She is alert and oriented to person, place, and time.  Psychiatric:        Mood and Affect: Mood and affect normal.        Behavior: Behavior normal.     Ortho Exam negative Spurling right and left.  She does have some brachial plexus tenderness limitation of rotation right and left 50%.  Limitation of  abduction of the shoulder to 100 degrees and flexion 100.  Passively we can get an additional 20 degrees with pain.  Long head of the biceps is minimally tender.  Positive impingement negative drop arm test elbow reaching full extension.  Ulnar nerve at the elbow median nerve at the wrist is normal.  Specialty Comments:  No specialty comments available.  Imaging: No results found.   PMFS History: Patient Active Problem List   Diagnosis Date Noted  . Calcaneus fracture 10/17/2019  . Hyperlipidemia 07/31/2019  . S/P lumbar fusion 02/21/2019  . DDD (degenerative disc disease), lumbar 07/06/2018  . Osteoporosis 04/24/2018  . Encounter for gynecological examination with Papanicolaou smear of cervix 09/08/2015  . Abnormal Pap smear of vagina 10/15/2014  . Acquired trigger finger 09/03/2014  . Constipation 04/04/2014  . VAIN (vaginal intraepithelial neoplasia) 06/20/2013  . Knee pain,  right 06/14/2013  . Allergic rhinitis 08/22/2012  . Neuropathy, arm 02/19/2012  . GERD (gastroesophageal reflux disease) 02/19/2012  . Alopecia 02/19/2012  . Anxiety 10/03/2011  . Tremor 10/03/2011  . Tobacco use 10/03/2011  . OA (osteoarthritis) 08/11/2011  . Cancer (HCC) 08/11/2011  . Essential hypertension, benign 08/09/2011  . Vitamin D deficiency 08/09/2011   Past Medical History:  Diagnosis Date  . Back pain   . Carpal tunnel syndrome   . Constipation   . Hypertension   . Low grade squamous intraepith lesion on cytologic smear cervix (lgsil)    +hpv, HAD HYSTERECTOMY  . OA (osteoarthritis)     Family History  Problem Relation Age of Onset  . Diabetes Father   . Cancer Father        prostate cancer  . Cancer Sister        breast  . Hypertension Son   . Cancer Maternal Grandmother   . Eczema Daughter   . Other Daughter        stomach issues    Past Surgical History:  Procedure Laterality Date  . ABDOMINAL HYSTERECTOMY  2006  . BREAST CYST EXCISION     right  . BUNIONECTOMY     left  . CARPAL TUNNEL RELEASE Right   . ESOPHAGOGASTRODUODENOSCOPY  06/13/2012   Procedure: ESOPHAGOGASTRODUODENOSCOPY (EGD);  Surgeon: Malissa Hippo, MD;  Location: [facility];  Service: Endoscopy;  Laterality: N/A;  200  . EYE SURGERY Bilateral 2020  . TRIGGER FINGER RELEASE Right 08/25/2016   Procedure: RELEASE TRIGGER FINGER/A-1 PULLEY RIGHT LONG TRIGGER FINGER RELEASE;  Surgeon: Vickki Hearing, MD;  Location: [facility] ORS;  Service: Orthopedics;  Laterality: Right;   Social History   Occupational History  . Not on file  Tobacco Use  . Smoking status: Former Smoker    Packs/day: 0.25    Years: 50.00    Pack years: 12.50    Types: Cigarettes    Quit date: 07/28/2013    Years since quitting: 6.8  . Smokeless tobacco: Never Used  Vaping Use  . Vaping Use: Every day  . Substances: Nicotine  Substance and Sexual Activity  . Alcohol use: Yes    Alcohol/week: 0.0 standard  drinks    Comment: occ  . Drug use: No  . Sexual activity: Not Currently    Birth control/protection: Surgical    Comment: hyst

## 2020-06-03 ENCOUNTER — Ambulatory Visit: Payer: Medicare Other | Admitting: Family Medicine
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
## 2020-06-04 ENCOUNTER — Telehealth: Payer: Self-pay | Admitting: Radiology

## 2020-06-04 NOTE — Telephone Encounter (Signed)
Pt has been having back pain since she came out of the boot. Any suggestions as to what she can do?   [phone]

## 2020-06-04 NOTE — Telephone Encounter (Signed)
I called discussed.  

## 2020-06-08 ENCOUNTER — Ambulatory Visit: Payer: Self-pay

## 2020-06-08 DIAGNOSIS — I10 Essential (primary) hypertension: Secondary | ICD-10-CM

## 2020-06-08 DIAGNOSIS — E785 Hyperlipidemia, unspecified: Secondary | ICD-10-CM

## 2020-06-08 NOTE — Patient Instructions (Addendum)
Visit Information  Goals Addressed            This Visit's Progress   . Pharmacy Care Plan:       CARE PLAN ENTRY (see longitudinal plan of care for additional care plan information)  Current Barriers:  . Chronic Disease Management support, education, and care coordination needs related to Hypertension and Hyperlipidemia   Hypertension BP Readings from Last 3 Encounters:  04/17/20 (!) 160/86  03/09/20 (!) 181/103  01/30/20 (!) 160/103   . Pharmacist Clinical Goal(s): o Over the next 90 days, patient will work with PharmD and providers to achieve BP goal <140/90 . Current regimen:  o Lisinopril 40mg  o Propranolol ER 60mg  . Interventions: o Discussed home monitoring, she will record twice daily. o Regular follow up initiated, CMA in 30 days. o Counseled on medication adherence. . Patient self care activities - Over the next 90 days, patient will: o Check BP daily, document, and provide at future appointments o Ensure daily salt intake < 2300 mg/day o Write down BP tonight and tomorrow morning and bring in to PCP visit tomorrow morning.  Hyperlipidemia Lab Results  Component Value Date/Time   LDLCALC 110 (H) 07/31/2019 04:00 PM   . Pharmacist Clinical Goal(s): o Over the next 90 days, patient will work with PharmD and providers to achieve LDL goal < 100 . Current regimen:  o No medications . Interventions: o Reviewed most recent lipid panel o Discussed dietary modifications o Recommended PCP visit for lipid panel . Patient self care activities - Over the next 90 days, patient will: o Continue to work on dietary changes limiting saturated fats and processed foods to promote better cholesterol, limit fried foods. o Schedule visit with PCP for lipid panel.   Please see past updates related to this goal by clicking on the "Past Updates" button in the selected goal         The patient verbalized understanding of instructions, educational materials, and care plan  provided today and agreed to receive a mailed copy of patient instructions, educational materials, and care plan.   Telephone follow up appointment with pharmacy team member scheduled for: 3 months  Edythe Clarity, [facility]  How to Take Your Blood Pressure Blood pressure measures how strongly your blood is pressing against the walls of your arteries. Arteries are blood vessels that carry blood from your heart throughout your body. You can take your blood pressure at home with a machine. You may need to check your blood pressure at home:  To check if you have high blood pressure (hypertension).  To check your blood pressure over time.  To make sure your blood pressure medicine is working. Supplies needed:  Blood pressure machine, or monitor.  Dining room chair to sit in.  Table or desk.  Small notebook.  Pencil or pen. How to prepare Avoid these things for 30 minutes before checking your blood pressure:  Having drinks with caffeine in them, such as coffee or tea.  Drinking alcohol.  Eating.  Smoking.  Exercising. Do these things five minutes before checking your blood pressure:  Go to the bathroom and pee (urinate).  Sit in a dining chair. Do not sit in a soft couch or an armchair.  Be quiet. Do not talk. How to take your blood pressure Follow the instructions that came with your machine. If you have a digital blood pressure monitor, these may be the instructions: 1. Sit up straight. 2. Place your feet on the floor. Do  not cross your ankles or legs. 3. Rest your left arm at the level of your heart. You may rest it on a table, desk, or chair. 4. Pull up your shirt sleeve. 5. Wrap the blood pressure cuff around the upper part of your left arm. The cuff should be 1 inch (2.5 cm) above your elbow. It is best to wrap the cuff around bare skin. 6. Fit the cuff snugly around your arm. You should be able to place only one finger between the cuff and your arm. 7. Place the  cord so that it rests in the bend of your elbow. 8. Press the power button. 9. Sit quietly while the cuff fills with air and loses air. 10. Write down the numbers on the screen. 11. Wait 2-3 minutes and then repeat steps 1-10.   What do the numbers mean? Two numbers make up your blood pressure. The first number is called systolic pressure. The second is called diastolic pressure. An example of a blood pressure reading is "120 over 80" (or 120/80). If you are an adult and do not have a medical condition, use this guide to find out if your blood pressure is normal: Normal  First number: below 120.  Second number: below 80. Elevated  First number: 120-129.  Second number: below 80. Hypertension stage 1  First number: 130-139.  Second number: 80-89. Hypertension stage 2  First number: 140 or above.  Second number: 49 or above. Your blood pressure is above normal even if only the top or bottom number is above normal. Follow these instructions at home:  Check your blood pressure as often as your doctor tells you to.  Check your blood pressure at the same time every day.  Take your monitor to your next doctor's appointment. Your doctor will: ? Make sure you are using it correctly. ? Make sure it is working right.  Make sure you understand what your blood pressure numbers should be.  Tell your doctor if your medicine is causing side effects.  Keep all follow-up visits as told by your doctor. This is important. General tips:  You will need a blood pressure machine, or monitor. Your doctor can suggest a monitor. You can buy one at a drugstore or online. When choosing one: ? Choose one with an arm cuff. ? Choose one that wraps around your upper arm. Only one finger should fit between your arm and the cuff. ? Do not choose one that measures your blood pressure from your wrist or finger. Where to find more information American Heart Association: www.heart.org Contact a doctor  if:  Your blood pressure keeps being high. Get help right away if:  Your first blood pressure number is higher than 180.  Your second blood pressure number is higher than 120. Summary  Check your blood pressure at the same time every day.  Avoid caffeine, alcohol, smoking, and exercise for 30 minutes before checking your blood pressure.  Make sure you understand what your blood pressure numbers should be. This information is not intended to replace advice given to you by your health care provider. Make sure you discuss any questions you have with your health care provider. Document Revised: 05/10/2019 Document Reviewed: 05/10/2019 Elsevier Patient Education  2021 [employer].

## 2020-06-09 ENCOUNTER — Other Ambulatory Visit: Payer: Self-pay

## 2020-06-09 ENCOUNTER — Telehealth (INDEPENDENT_AMBULATORY_CARE_PROVIDER_SITE_OTHER): Payer: Medicare Other | Admitting: Family Medicine

## 2020-06-09 VITALS — BP 142/88

## 2020-06-09 DIAGNOSIS — F4310 Post-traumatic stress disorder, unspecified: Secondary | ICD-10-CM

## 2020-06-09 DIAGNOSIS — F419 Anxiety disorder, unspecified: Secondary | ICD-10-CM | POA: Diagnosis not present

## 2020-06-09 DIAGNOSIS — I10 Essential (primary) hypertension: Secondary | ICD-10-CM | POA: Diagnosis not present

## 2020-06-09 MED ORDER — SERTRALINE HCL 25 MG PO TABS: 25.0000 mg | ORAL_TABLET | Freq: Every day | ORAL | 2 refills | Status: DC
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
## 2020-06-10 ENCOUNTER — Encounter: Payer: Self-pay | Admitting: Family Medicine

## 2020-06-11 ENCOUNTER — Inpatient Hospital Stay: Admission: RE | Admit: 2020-06-11 | Payer: Medicare Other | Source: Ambulatory Visit

## 2020-06-15 ENCOUNTER — Ambulatory Visit: Payer: Medicare Other | Admitting: Cardiothoracic Surgery

## 2020-06-18 ENCOUNTER — Telehealth: Payer: Self-pay | Admitting: *Deleted

## 2020-06-18 NOTE — Telephone Encounter (Signed)
Received call from patient.   Inquired as to status of referral .

## 2020-06-19 ENCOUNTER — Telehealth: Payer: Self-pay | Admitting: Pharmacist

## 2020-06-19 ENCOUNTER — Telehealth: Payer: Self-pay | Admitting: Family Medicine

## 2020-06-19 NOTE — Progress Notes (Signed)
    Chronic Care Management Pharmacy Assistant   Name: Rachel [name]  MRN: [id number] DOB: 1946/02/24  Reason for Encounter: Adherence Review  Patient Questions:  1.  Have you seen any other providers since your last visit? Yes.   2.  Any changes in your medicines or health? Yes.    PCP : [name], MD  Office Visits: 06/09/20 (Telemedicine) Dr. Buelah Manis.  Referral sent for Therapist. STARTED Sertraline HCL 25 mg at bedtime.   Verified Adherence Gap Information. Per insurance data, the patient is 80-89% compliant with the following medication: Lisinopril 20 mg. The patient has not met their annual wellness and wellness bundle screening. The patient has not met their colorectal cancer screening. The patients total gap measures equal to 4.  Follow-Up:  Pharmacist Review

## 2020-06-19 NOTE — Telephone Encounter (Signed)
  Checking on her referral to see a Psychology

## 2020-06-19 NOTE — Telephone Encounter (Signed)
Please contact patient with update

## 2020-07-06 ENCOUNTER — Ambulatory Visit: Payer: Medicare Other | Admitting: Cardiothoracic Surgery

## 2020-07-13 ENCOUNTER — Ambulatory Visit: Payer: Medicare Other | Admitting: Cardiothoracic Surgery

## 2020-07-13 ENCOUNTER — Inpatient Hospital Stay: Admission: RE | Admit: 2020-07-13 | Payer: Medicare Other | Source: Ambulatory Visit

## 2020-07-13 NOTE — Telephone Encounter (Signed)
I called patient and gave her their number to call.Patient states that she doesn't answer numbers she doesn't recognize.  She verbalized understanding and will call them directly to schedule.   Per referral: Left message on 1/11 sent paperwork by mail on 1/18 and followed up on 1/25 with no response or return of paperwork to schedule

## 2020-07-16 ENCOUNTER — Other Ambulatory Visit: Payer: Self-pay

## 2020-07-16 ENCOUNTER — Ambulatory Visit: Payer: Medicare Other | Admitting: Orthopaedic Surgery

## 2020-07-20 ENCOUNTER — Telehealth: Payer: Self-pay | Admitting: *Deleted

## 2020-07-20 NOTE — Telephone Encounter (Signed)
Received call from patient.   Reports that she has had GI Upset and diarrhea last week. States that Sx have gotten better at this time.

## 2020-07-21 ENCOUNTER — Telehealth: Payer: Self-pay | Admitting: Pharmacist

## 2020-07-21 NOTE — Progress Notes (Addendum)
Chronic Care Management Pharmacy Assistant   Name: Rachel [name]  MRN: [id number] DOB: [birth year]  Reason for Encounter: Disease State For HTN.  Patient Questions:  1.  Have you seen any other providers since your last visit? No.   2.  Any changes in your medicines or health? No.   PCP : [name], MD   Their chronic conditions include: HTN, Allergic Rhinitis, GERD, Osteoporosis, HLD.  Office Visits: None since 06/19/20  Consults: None since 06/19/20  Allergies:  No Known Allergies  Medications: Outpatient Encounter Medications as of 07/21/2020  Medication Sig   Ascorbic Acid (VITA-C PO) Take by mouth.   cholecalciferol (VITAMIN D3) 25 MCG (1000 UNIT) tablet Take 1,000 Units by mouth daily.   docusate sodium (COLACE) 100 MG capsule TAKE 1 CAPSULE BY MOUTH EVERY 12 HOURS (Patient taking differently: Take 100-200 mg by mouth daily as needed for mild constipation.)   lisinopril (ZESTRIL) 20 MG tablet Take 2 tablets (40 mg total) by mouth daily.   magnesium citrate solution Take 74-148 mLs by mouth daily as needed (constipation).   Multiple Vitamin (MULTIVITAMIN WITH MINERALS) TABS tablet Take 1 tablet by mouth daily.   pantoprazole (PROTONIX) 40 MG tablet TAKE 1 TABLET BY MOUTH  DAILY   propranolol ER (INDERAL LA) 60 MG 24 hr capsule TAKE 1 CAPSULE BY MOUTH  DAILY FOR BLOOD PRESSURE   sertraline (ZOLOFT) 25 MG tablet Take 1 tablet (25 mg total) by mouth at bedtime. For anxiety   vitamin E 180 MG (400 UNITS) capsule Take 400 Units by mouth daily.   No facility-administered encounter medications on file as of 07/21/2020.    Current Diagnosis: Patient Active Problem List   Diagnosis Date Noted   Calcaneus fracture 10/17/2019   Hyperlipidemia 07/31/2019   S/P lumbar fusion 02/21/2019   DDD (degenerative disc disease), lumbar 07/06/2018   Osteoporosis 04/24/2018   Encounter for gynecological examination with Papanicolaou smear of cervix 09/08/2015   Abnormal Pap  smear of vagina 10/15/2014   Acquired trigger finger 09/03/2014   Constipation 04/04/2014   VAIN (vaginal intraepithelial neoplasia) 06/20/2013   Knee pain, right 06/14/2013   Allergic rhinitis 08/22/2012   Neuropathy, arm 02/19/2012   GERD (gastroesophageal reflux disease) 02/19/2012   Alopecia 02/19/2012   Anxiety 10/03/2011   Tremor 10/03/2011   Tobacco use 10/03/2011   OA (osteoarthritis) 08/11/2011   Cancer ([place]) 08/11/2011   Essential hypertension, benign 08/09/2011   Vitamin D deficiency 08/09/2011    Goals Addressed   None    Reviewed chart prior to disease state call. Spoke with patient regarding BP  Recent Office Vitals: BP Readings from Last 3 Encounters:  06/09/20 (!) 142/88  04/17/20 (!) 160/86  03/09/20 (!) 181/103   Pulse Readings from Last 3 Encounters:  04/17/20 67  03/09/20 61  12/06/19 61    Wt Readings from Last 3 Encounters:  05/28/20 161 lb (73 kg)  04/17/20 161 lb 9.6 oz (73.3 kg)  03/12/20 155 lb (70.3 kg)     Kidney Function Lab Results  Component Value Date/Time   CREATININE 1.14 (H) 04/17/2020 04:23 PM   CREATININE 0.80 10/10/2019 05:46 PM   CREATININE 0.95 10/10/2019 05:42 PM   CREATININE 1.11 (H) 07/31/2019 04:00 PM   GFRNONAA 59 (L) 10/10/2019 05:42 PM   GFRAA >60 10/10/2019 05:42 PM    BMP Latest Ref Rng & Units 04/17/2020 10/10/2019 10/10/2019  Glucose 65 - 99 mg/dL 94 101(H) 100(H)  BUN 7 - 25 mg/dL 16  15 12  Creatinine 0.60 - 0.93 mg/dL 1.14(H) 0.80 0.95  BUN/Creat Ratio 6 - 22 (calc) 14 - -  Sodium 135 - 146 mmol/L 140 140 141  Potassium 3.5 - 5.3 mmol/L 4.2 3.8 4.6  Chloride 98 - 110 mmol/L 105 107 105  CO2 20 - 32 mmol/L 25 - 22  Calcium 8.6 - 10.4 mg/dL 10.2 - 9.6    Current antihypertensive regimen:  Lisinopril 40 mg Propranolol ER 60 mg  How often are you checking your Blood Pressure? twice daily   Current home BP readings: 126/81 116/79 131/85 132/79 105/72 106/77 Patent stated her blood pressure has  been running much lower then before.   What recent interventions/DTPs have been made by any provider to improve Blood Pressure control since last CPP Visit: None.  Any recent hospitalizations or ED visits since last visit with CPP? Patient stated no.  What diet changes have been made to improve Blood Pressure Control?  Patient stated she is drinking about 4-5 bottles of water daily. She stated she is still trying her best to eat all healthy foods.  What exercise is being done to improve your Blood Pressure Control?  Patient stated she doesn't do any exercising but she does do house duties. She stated she really doesn't have any energy and she wonders if there is something that can boost her energy she stated she gets out of breath very easily.    Adherence Review: Is the patient currently on ACE/ARB medication? Yes, Lisinopril 20 mg    Does the patient have >5 day gap between last estimated fill dates? No  Patient stated she thinks her acid reflex is worsening and was wondering if she can take more than one tablet a day of her Protonix. I sent a message to Dr. Dorian Heckle nurse.    Follow-Up:  Pharmacist Review   Charlann Lange, RMA Clinical Pharmacist Assistant [phone]  10 minutes spent in review, coordination, and documentation.  Reviewed by: Beverly Milch, PharmD Clinical Pharmacist [facility] [phone]

## 2020-07-22 ENCOUNTER — Telehealth: Payer: Self-pay | Admitting: *Deleted

## 2020-07-22 MED ORDER — FAMOTIDINE 20 MG PO TABS: 20.0000 mg | ORAL_TABLET | Freq: Two times a day (BID) | ORAL | 1 refills | Status: DC

## 2020-07-22 NOTE — Telephone Encounter (Signed)
Call placed to patient and patient made aware.   Prescription sent to pharmacy.  

## 2020-07-22 NOTE — Telephone Encounter (Signed)
-----   Message from [name], MD sent at 07/22/2020  7:59 AM EST ----- Regarding: RE: Medication questions She can take the protonix in the morning, add pepcid 20mg  OTC in the evening for reflux   ----- Message ----- From: Sheral Flow, LPN Sent: 1/49/7026   3:29 PM EST To: [name], MD Subject: FW: Medication questions                        ----- Message ----- From: Rachel [name] Sent: 07/21/2020   3:16 PM EST To: Rachel Lathe Deklyn Gibbon, LPN Subject: Medication questions                           Good afternoon I spoke with the patient today and she stated she thinks her acid reflex is worsening and was wondering if she can take more than one tablet a day of her Protonix. Please advise.

## 2020-07-23 ENCOUNTER — Other Ambulatory Visit: Payer: Self-pay

## 2020-07-23 ENCOUNTER — Encounter: Payer: Self-pay | Admitting: Orthopaedic Surgery

## 2020-07-23 ENCOUNTER — Ambulatory Visit: Payer: Self-pay

## 2020-07-23 ENCOUNTER — Ambulatory Visit (INDEPENDENT_AMBULATORY_CARE_PROVIDER_SITE_OTHER): Payer: Medicare Other | Admitting: Orthopaedic Surgery

## 2020-07-23 VITALS — Ht 64.5 in | Wt 161.0 lb

## 2020-07-23 DIAGNOSIS — Z981 Arthrodesis status: Secondary | ICD-10-CM | POA: Diagnosis not present

## 2020-07-23 DIAGNOSIS — M5136 Other intervertebral disc degeneration, lumbar region: Secondary | ICD-10-CM

## 2020-07-23 DIAGNOSIS — M545 Low back pain, unspecified: Secondary | ICD-10-CM

## 2020-07-23 DIAGNOSIS — G8929 Other chronic pain: Secondary | ICD-10-CM | POA: Diagnosis not present
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
## 2020-07-24 ENCOUNTER — Other Ambulatory Visit: Payer: Medicare Other

## 2020-07-27 ENCOUNTER — Other Ambulatory Visit: Payer: Medicare Other

## 2020-07-29 ENCOUNTER — Other Ambulatory Visit: Payer: Self-pay | Admitting: Family Medicine

## 2020-07-30 ENCOUNTER — Telehealth: Payer: Self-pay | Admitting: *Deleted

## 2020-07-30 NOTE — Telephone Encounter (Signed)
Received call from patient.   Reports that she requires diagnostic mammogram for breast pain.  Appointment scheduled PCP.

## 2020-08-05 ENCOUNTER — Ambulatory Visit (INDEPENDENT_AMBULATORY_CARE_PROVIDER_SITE_OTHER): Payer: Medicare Other | Admitting: Family Medicine

## 2020-08-05 ENCOUNTER — Other Ambulatory Visit: Payer: Self-pay

## 2020-08-05 ENCOUNTER — Encounter: Payer: Self-pay | Admitting: Family Medicine

## 2020-08-05 VITALS — BP 148/82 | HR 68 | Temp 98.2°F | Resp 14 | Ht 64.5 in | Wt 163.0 lb

## 2020-08-05 DIAGNOSIS — N6320 Unspecified lump in the left breast, unspecified quadrant: Secondary | ICD-10-CM

## 2020-08-05 DIAGNOSIS — I10 Essential (primary) hypertension: Secondary | ICD-10-CM | POA: Diagnosis not present

## 2020-08-05 NOTE — Addendum Note (Signed)
Addended by: Vic Blackbird F on: 08/05/2020 02:40 PM   Modules accepted: Orders

## 2020-08-05 NOTE — Assessment & Plan Note (Signed)
Blood pressure much improved today no changes

## 2020-08-05 NOTE — Patient Instructions (Addendum)
F/U 4 months Rachel [name]  Diagnostic Mammogram/ Ultrasound to be done  Use [place] for cough and congestion

## 2020-08-05 NOTE — Progress Notes (Signed)
   Subjective:    Patient ID: Rachel [name], female    DOB: 08-Sep-1945, 75 y.o.   MRN: [id number]  Patient presents for L Breast Tenderness (Outer L side of L breast tender to touch with 2-3 lumps)  Patient here secondary to lump in her left breast.  States she has felt this for about a year.  Her mammogram was abnormal in 2020 with ultrasound was benign.  Her mammogram in March 2021 was normal.  She now has some tenderness over 2 nodules seem more prominent. No drainage from the nipple.  He is taking her blood pressure medicines as prescribed. Review Of Systems:  GEN- denies fatigue, fever, weight loss,weakness, recent illness HEENT- denies eye drainage, change in vision, nasal discharge, CVS- denies chest pain, palpitations RESP- denies SOB, cough, wheeze ABD- denies N/V, change in stools, abd pain Neuro- denies headache, dizziness, syncope, seizure activity       Objective:    BP (!) 148/82   Pulse 68   Temp 98.2 F (36.8 C) (Temporal)   Resp 14   Ht 5' 4.5" (1.638 m)   Wt 163 lb (73.9 kg)   SpO2 99%   BMI 27.55 kg/m   GEN- NAD, alert and oriented, Neck- supple, no thyromegaly Breast- normal symmetry, no nipple inversion,no nipple drainage, left breast 1 olock and 5 olock position 2 small nodules palpated TTP , Nodes- no axillary nodes CVS- RRR, no murmur RESP-CTAB      Assessment & Plan:      Problem List Items Addressed This Visit      Unprioritized   Essential hypertension, benign (Chronic)    Blood pressure much improved today no changes       Other Visit Diagnoses    Left breast mass    -  Primary   2 discrete nodules palpated with discomfort, diagnostic mammo and [place] to be set up   Relevant Orders   MM DIAG BREAST TOMO BILATERAL   US BREAST LTD UNI LEFT INC AXILLA      Note: This dictation was prepared with Dragon dictation along with smaller phrase technology. Any transcriptional errors that result from this process are unintentional.

## 2020-08-06 ENCOUNTER — Encounter: Payer: Self-pay | Admitting: Family Medicine

## 2020-08-07 ENCOUNTER — Telehealth: Payer: Self-pay | Admitting: *Deleted

## 2020-08-07 NOTE — Telephone Encounter (Signed)
Received call from patient.   Reports that she is taking Nervive, a supplement for neuropathy.  INGREDIENTS:  Thiamin (as thiamin mononitrate) Vitamin B6 (as pyridoxine hydrochloride) Vitamin B12 (as cyanocobalamin) Alpha lipoic acid Other Ingredients: Microcrystalline cellulose, dicalcium phosphate, hydroxypropyl methylcellulose, magnesium stearate, silicon dioxide, hydroxypropyl cellulose, stearic acid, calcium silicate, FD&C Blue No. 2 [place]um [place], medium chain triglycerides oil, glycerin, flavors

## 2020-08-09 ENCOUNTER — Other Ambulatory Visit: Payer: Self-pay | Admitting: Family Medicine

## 2020-08-09 DIAGNOSIS — I10 Essential (primary) hypertension: Secondary | ICD-10-CM

## 2020-08-09 NOTE — Telephone Encounter (Signed)
Okay to take this supplement

## 2020-08-10 ENCOUNTER — Other Ambulatory Visit: Payer: Self-pay

## 2020-08-10 ENCOUNTER — Ambulatory Visit (HOSPITAL_COMMUNITY)
Admission: RE | Admit: 2020-08-10 | Discharge: 2020-08-10 | Disposition: A | Discharge status: Home / Self Care | Payer: Medicare Other | Source: Ambulatory Visit | Attending: Orthopaedic Surgery | Admitting: Orthopaedic Surgery

## 2020-08-10 ENCOUNTER — Ambulatory Visit: Payer: Medicare Other | Admitting: Cardiothoracic Surgery

## 2020-08-10 DIAGNOSIS — G8929 Other chronic pain: Secondary | ICD-10-CM

## 2020-08-10 DIAGNOSIS — M545 Low back pain, unspecified: Secondary | ICD-10-CM | POA: Diagnosis not present

## 2020-08-10 NOTE — Telephone Encounter (Signed)
Call placed to patient and patient made aware.  

## 2020-08-11 ENCOUNTER — Other Ambulatory Visit: Payer: Self-pay | Admitting: Family Medicine

## 2020-08-17 ENCOUNTER — Ambulatory Visit (INDEPENDENT_AMBULATORY_CARE_PROVIDER_SITE_OTHER): Payer: Medicare Other | Admitting: Cardiothoracic Surgery

## 2020-08-17 ENCOUNTER — Ambulatory Visit
Admission: RE | Admit: 2020-08-17 | Discharge: 2020-08-17 | Disposition: A | Discharge status: Home / Self Care | Payer: Medicare Other | Source: Ambulatory Visit | Attending: Cardiothoracic Surgery | Admitting: Cardiothoracic Surgery

## 2020-08-17 ENCOUNTER — Other Ambulatory Visit: Payer: Medicare Other

## 2020-08-17 ENCOUNTER — Other Ambulatory Visit: Payer: Self-pay

## 2020-08-17 VITALS — BP 172/104 | HR 68 | Resp 20 | Ht 64.0 in | Wt 162.0 lb

## 2020-08-17 DIAGNOSIS — I712 Thoracic aortic aneurysm, without rupture, unspecified: Secondary | ICD-10-CM

## 2020-08-17 DIAGNOSIS — I77810 Thoracic aortic ectasia: Secondary | ICD-10-CM | POA: Diagnosis not present

## 2020-08-17 MED ORDER — IOPAMIDOL (ISOVUE-300) INJECTION 61%
75.0000 mL | Freq: Once | INTRAVENOUS | Status: AC | PRN
  Administered 2020-08-17: 75 mL via INTRAVENOUS

## 2020-08-17 MED ORDER — AMLODIPINE BESYLATE 5 MG PO TABS: 5.0000 mg | ORAL_TABLET | Freq: Every day | ORAL | 11 refills | Status: DC

## 2020-08-19 ENCOUNTER — Ambulatory Visit: Payer: Medicare Other | Admitting: Psychologist

## 2020-08-20 ENCOUNTER — Other Ambulatory Visit: Payer: Self-pay

## 2020-08-20 ENCOUNTER — Other Ambulatory Visit (HOSPITAL_COMMUNITY): Payer: Self-pay | Admitting: Nurse Practitioner

## 2020-08-20 ENCOUNTER — Ambulatory Visit (INDEPENDENT_AMBULATORY_CARE_PROVIDER_SITE_OTHER): Payer: Medicare Other | Admitting: Orthopaedic Surgery

## 2020-08-20 DIAGNOSIS — N6320 Unspecified lump in the left breast, unspecified quadrant: Secondary | ICD-10-CM

## 2020-08-20 DIAGNOSIS — Z981 Arthrodesis status: Secondary | ICD-10-CM | POA: Diagnosis not present

## 2020-08-20 DIAGNOSIS — M48062 Spinal stenosis, lumbar region with neurogenic claudication: Secondary | ICD-10-CM
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
## 2020-08-27 ENCOUNTER — Telehealth: Payer: Self-pay

## 2020-08-27 NOTE — Telephone Encounter (Signed)
I called discussed. Sore after walking in neighborhood

## 2020-08-27 NOTE — Telephone Encounter (Signed)
Patient called said she would like to speak with Dr [name].  She has been walking as she was advised to do but her right foot is still giving her some issues. She would like to know if foot will heal 100%? Please call patient [phone]

## 2020-09-01 ENCOUNTER — Inpatient Hospital Stay (HOSPITAL_COMMUNITY): Admission: RE | Admit: 2020-09-01 | Payer: Medicare Other | Source: Ambulatory Visit

## 2020-09-01 ENCOUNTER — Ambulatory Visit (HOSPITAL_COMMUNITY): Payer: Medicare Other

## 2020-09-03 ENCOUNTER — Ambulatory Visit (INDEPENDENT_AMBULATORY_CARE_PROVIDER_SITE_OTHER): Payer: Medicare Other | Admitting: Psychologist

## 2020-09-03 DIAGNOSIS — F4310 Post-traumatic stress disorder, unspecified: Secondary | ICD-10-CM

## 2020-09-09 ENCOUNTER — Ambulatory Visit: Payer: Medicare Other | Admitting: Psychologist

## 2020-09-09 ENCOUNTER — Ambulatory Visit (INDEPENDENT_AMBULATORY_CARE_PROVIDER_SITE_OTHER): Payer: Medicare Other | Admitting: Psychologist

## 2020-09-09 DIAGNOSIS — F4310 Post-traumatic stress disorder, unspecified: Secondary | ICD-10-CM

## 2020-09-15 ENCOUNTER — Other Ambulatory Visit: Payer: Self-pay

## 2020-09-15 ENCOUNTER — Ambulatory Visit (HOSPITAL_COMMUNITY)
Admission: RE | Admit: 2020-09-15 | Discharge: 2020-09-15 | Disposition: A | Discharge status: Home / Self Care | Payer: Medicare Other | Source: Ambulatory Visit | Attending: Nurse Practitioner | Admitting: Nurse Practitioner

## 2020-09-15 DIAGNOSIS — N6321 Unspecified lump in the left breast, upper outer quadrant: Secondary | ICD-10-CM | POA: Insufficient documentation

## 2020-09-15 DIAGNOSIS — R928 Other abnormal and inconclusive findings on diagnostic imaging of breast: Secondary | ICD-10-CM | POA: Diagnosis not present

## 2020-09-15 DIAGNOSIS — N6323 Unspecified lump in the left breast, lower outer quadrant: Secondary | ICD-10-CM | POA: Insufficient documentation

## 2020-09-15 DIAGNOSIS — N6320 Unspecified lump in the left breast, unspecified quadrant: Secondary | ICD-10-CM | POA: Diagnosis present

## 2020-09-15 DIAGNOSIS — N6489 Other specified disorders of breast: Secondary | ICD-10-CM | POA: Diagnosis not present

## 2020-09-17 ENCOUNTER — Telehealth: Payer: Self-pay | Admitting: Family Medicine

## 2020-09-17 DIAGNOSIS — I10 Essential (primary) hypertension: Secondary | ICD-10-CM

## 2020-09-17 MED ORDER — LISINOPRIL 20 MG PO TABS: 1.0000 | ORAL_TABLET | Freq: Every day | ORAL | 0 refills | Status: DC

## 2020-09-17 NOTE — Telephone Encounter (Signed)
Faxed refill authorization request for  lisinopril (ZESTRIL) 20 MG tablet

## 2020-09-22 IMAGING — RF DG LUMBAR SPINE 2-3V
1 series · 2 of 2 positions shown · non-contrast
Comparison: MRI lumbar spine 07/19/2018

CLINICAL DATA: L4-5 PLIF.

EXAM:
LUMBAR SPINE - 2-3 VIEW; DG C-ARM 1-60 MIN

[Series 1: run · 2 of 2 slices shown]
[im 1/2]
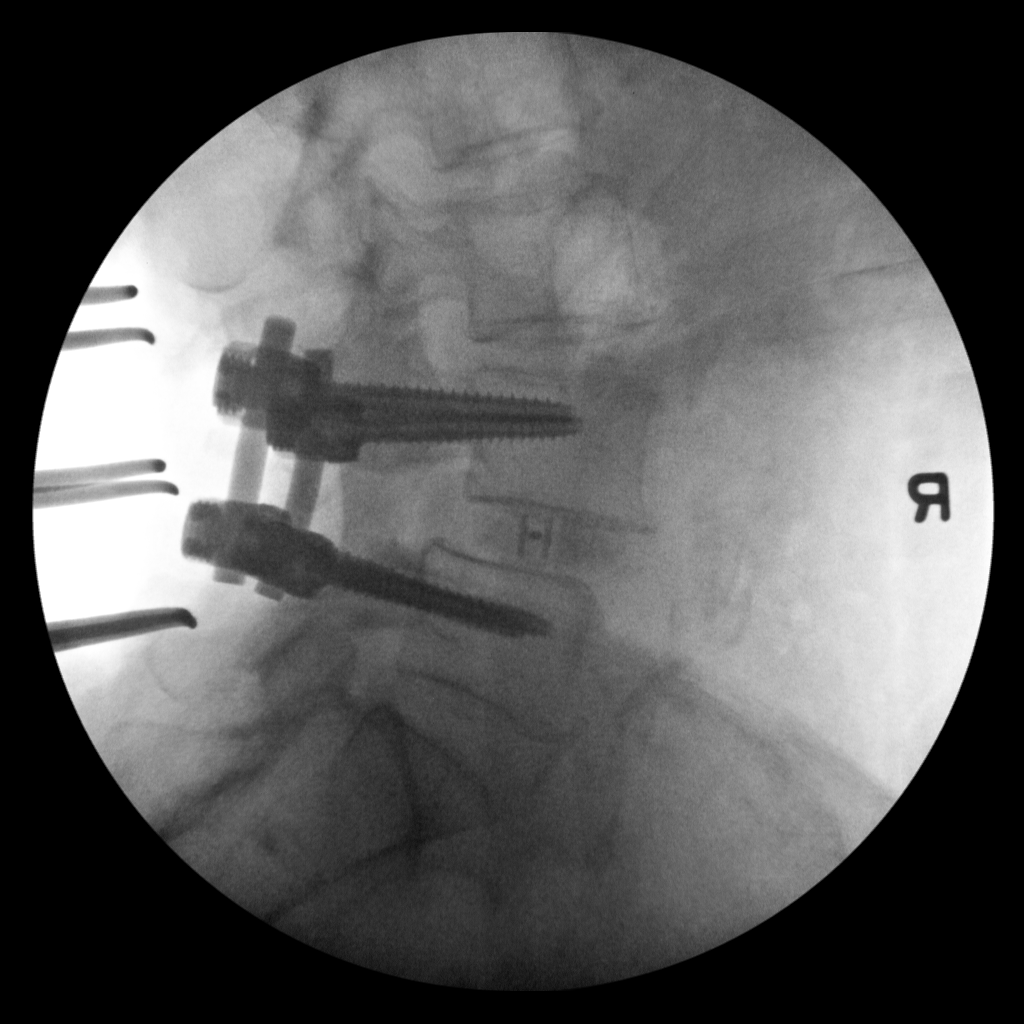
[im 2/2]
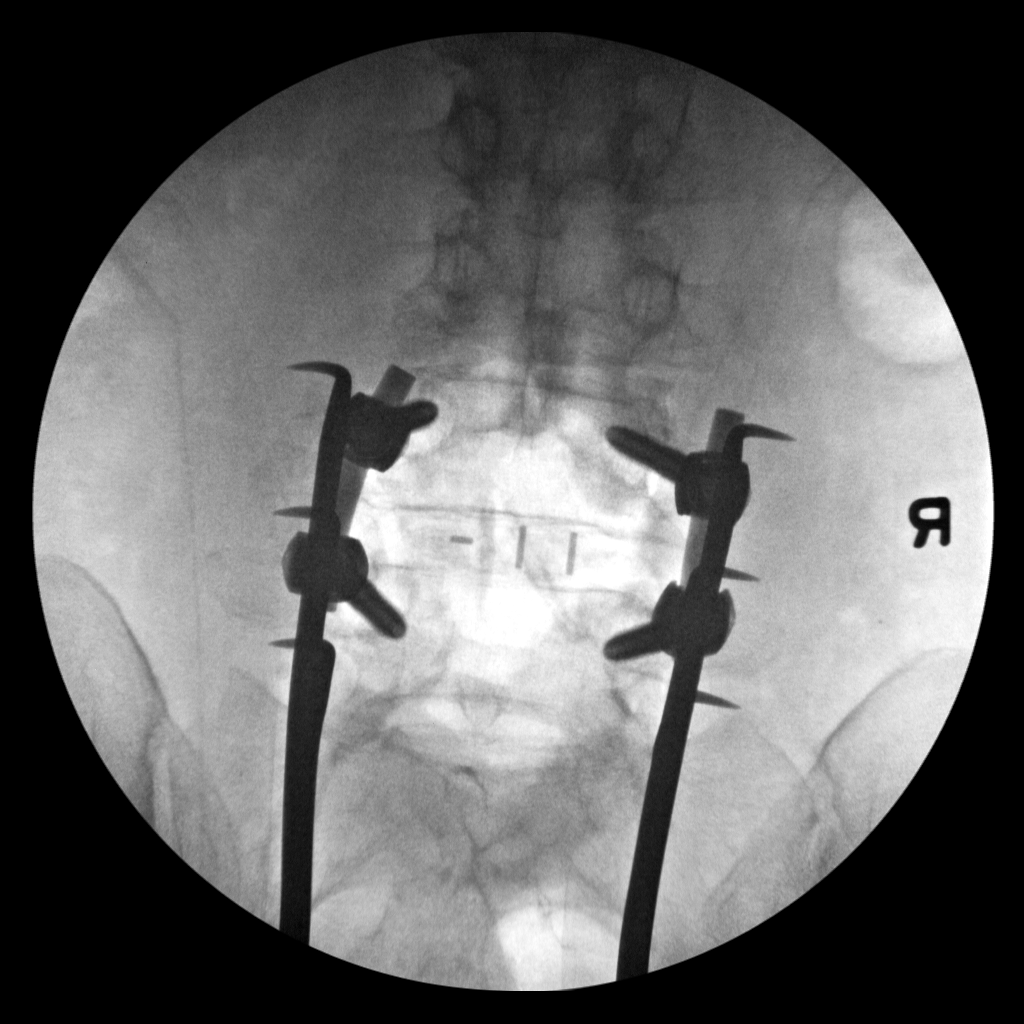

[2 of 2 positions shown; findings below may reference images not displayed]

FINDINGS: Examination demonstrates posterior fusion hardware intact at the
L4-5 level with interbody fusion at the L4-5 level. There is mild
spondylosis of the lumbar spine. Subtle grade 1 anterolisthesis of
L4 on L5.
IMPRESSION: Evidence of recent posterior spinal fusion at the L4-5 level with
hardware intact.

## 2020-09-23 ENCOUNTER — Ambulatory Visit: Payer: Medicare Other | Admitting: Nurse Practitioner

## 2020-09-25 ENCOUNTER — Other Ambulatory Visit: Payer: Self-pay

## 2020-09-25 ENCOUNTER — Encounter: Payer: Self-pay | Admitting: Nurse Practitioner

## 2020-09-25 ENCOUNTER — Ambulatory Visit (INDEPENDENT_AMBULATORY_CARE_PROVIDER_SITE_OTHER): Payer: Medicare Other | Admitting: Nurse Practitioner

## 2020-09-25 VITALS — BP 138/80 | HR 67 | Temp 98.9°F | Ht 64.0 in | Wt 163.8 lb

## 2020-09-25 DIAGNOSIS — I10 Essential (primary) hypertension: Secondary | ICD-10-CM

## 2020-09-25 DIAGNOSIS — E785 Hyperlipidemia, unspecified: Secondary | ICD-10-CM | POA: Diagnosis not present

## 2020-09-25 DIAGNOSIS — Z6828 Body mass index (BMI) 28.0-28.9, adult: Secondary | ICD-10-CM | POA: Diagnosis not present

## 2020-09-25 DIAGNOSIS — F419 Anxiety disorder, unspecified: Secondary | ICD-10-CM

## 2020-09-25 MED ORDER — SERTRALINE HCL 25 MG PO TABS: 25.0000 mg | ORAL_TABLET | Freq: Every day | ORAL | 0 refills | Status: DC
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
## 2020-09-26 DIAGNOSIS — R6889 Other general symptoms and signs: Secondary | ICD-10-CM | POA: Diagnosis not present

## 2020-09-26 DIAGNOSIS — R1111 Vomiting without nausea: Secondary | ICD-10-CM | POA: Diagnosis not present

## 2020-09-26 DIAGNOSIS — I10 Essential (primary) hypertension: Secondary | ICD-10-CM | POA: Diagnosis not present

## 2020-09-26 DIAGNOSIS — R42 Dizziness and giddiness: Secondary | ICD-10-CM | POA: Diagnosis not present

## 2020-09-26 DIAGNOSIS — R0902 Hypoxemia: Secondary | ICD-10-CM | POA: Diagnosis not present

## 2020-09-26 DIAGNOSIS — R110 Nausea: Secondary | ICD-10-CM | POA: Diagnosis not present

## 2020-09-26 DIAGNOSIS — Z743 Need for continuous supervision: Secondary | ICD-10-CM | POA: Diagnosis not present

## 2020-09-26 DIAGNOSIS — R112 Nausea with vomiting, unspecified: Secondary | ICD-10-CM | POA: Diagnosis not present

## 2020-09-26 DIAGNOSIS — K219 Gastro-esophageal reflux disease without esophagitis: Secondary | ICD-10-CM | POA: Diagnosis not present

## 2020-09-26 LAB — CBC WITH DIFFERENTIAL/PLATELET
Absolute Monocytes: 572 cells/uL (ref 200–950)
Basophils Absolute: 42 cells/uL (ref 0–200)
Basophils Relative: 0.8 %
Eosinophils Absolute: 80 cells/uL (ref 15–500)
Eosinophils Relative: 1.5 %
HCT: 42 % (ref 35.0–45.0)
Hemoglobin: 13.7 g/dL (ref 11.7–15.5)
Lymphs Abs: 1622 cells/uL (ref 850–3900)
MCH: 28.4 pg (ref 27.0–33.0)
MCHC: 32.6 g/dL (ref 32.0–36.0)
MCV: 87 fL (ref 80.0–100.0)
MPV: 12.5 fL (ref 7.5–12.5)
Monocytes Relative: 10.8 %
Neutro Abs: 2984 cells/uL (ref 1500–7800)
Neutrophils Relative %: 56.3 %
Platelets: 211 10*3/uL (ref 140–400)
RBC: 4.83 10*6/uL (ref 3.80–5.10)
RDW: 13.6 % (ref 11.0–15.0)
Total Lymphocyte: 30.6 %
WBC: 5.3 10*3/uL (ref 3.8–10.8)

## 2020-09-26 LAB — COMPLETE METABOLIC PANEL WITH GFR
AG Ratio: 1.3 (calc) (ref 1.0–2.5)
ALT: 18 U/L (ref 6–29)
AST: 19 U/L (ref 10–35)
Albumin: 4.2 g/dL (ref 3.6–5.1)
Alkaline phosphatase (APISO): 54 U/L (ref 37–153)
BUN/Creatinine Ratio: 15 (calc) (ref 6–22)
BUN: 15 mg/dL (ref 7–25)
CO2: 24 mmol/L (ref 20–32)
Calcium: 10.5 mg/dL — ABNORMAL HIGH (ref 8.6–10.4)
Chloride: 106 mmol/L (ref 98–110)
Creat: 0.99 mg/dL — ABNORMAL HIGH (ref 0.60–0.93)
GFR, Est African American: 65 mL/min/{1.73_m2} (ref >=60)
GFR, Est Non African American: 56 mL/min/{1.73_m2} — ABNORMAL LOW (ref >=60)
Globulin: 3.3 g/dL (calc) (ref 1.9–3.7)
Glucose, Bld: 98 mg/dL (ref 65–99)
Potassium: 4.2 mmol/L (ref 3.5–5.3)
Sodium: 140 mmol/L (ref 135–146)
Total Bilirubin: 0.6 mg/dL (ref 0.2–1.2)
Total Protein: 7.5 g/dL (ref 6.1–8.1)

## 2020-09-26 LAB — LIPID PANEL
Cholesterol: 224 mg/dL — ABNORMAL HIGH (ref <200)
HDL: 94 mg/dL (ref >=50)
LDL Cholesterol (Calc): 107 mg/dL (calc) — ABNORMAL HIGH
Non-HDL Cholesterol (Calc): 130 mg/dL (calc) — ABNORMAL HIGH (ref <130)
Total CHOL/HDL Ratio: 2.4 (calc) (ref <5.0)
Triglycerides: 122 mg/dL (ref <150)
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
## 2020-09-27 NOTE — Assessment & Plan Note (Signed)
Not currently on  Treatment.  Will check lipids today. Patient is fasting.

## 2020-09-27 NOTE — Assessment & Plan Note (Signed)
Chronic.  Controlled on amlodipine 5 mg daily and lisinopril 20 mg daily.  Continue these medications for now. Will check CMP for electrolytes and kidney function today.  Follow up in 6 months.

## 2020-09-28 ENCOUNTER — Telehealth: Payer: Self-pay

## 2020-09-28 NOTE — Telephone Encounter (Signed)
Transition Care Management Unsuccessful Follow-up Telephone Call  Date of discharge and from where:  09/26/2020 from [facility]  Attempts:  1st Attempt  Reason for unsuccessful TCM follow-up call:  Unable to leave message

## 2020-09-29 ENCOUNTER — Other Ambulatory Visit: Payer: Self-pay

## 2020-09-29 ENCOUNTER — Telehealth: Payer: Self-pay

## 2020-09-29 MED ORDER — ATORVASTATIN CALCIUM 10 MG PO TABS: 10.0000 mg | ORAL_TABLET | Freq: Every day | ORAL | 0 refills | Status: DC

## 2020-09-29 NOTE — Telephone Encounter (Signed)
Transition Care Management Unsuccessful Follow-up Telephone Call  Date of discharge and from where:  09/26/2020 from [facility]  Attempts:  2nd Attempt  Reason for unsuccessful TCM follow-up call:  Unable to leave message

## 2020-09-30 NOTE — Progress Notes (Signed)
Spoke with pt, indicates understanding 

## 2020-09-30 NOTE — Telephone Encounter (Signed)
Transition Care Management Unsuccessful Follow-up Telephone Call  Date of discharge and from where:  09/26/2020 from [facility]  Attempts:  3rd Attempt  Reason for unsuccessful TCM follow-up call:  Unable to reach patient

## 2020-10-19 ENCOUNTER — Ambulatory Visit: Payer: Medicare Other | Admitting: Psychologist

## 2020-10-21 ENCOUNTER — Telehealth: Payer: Self-pay | Admitting: Pharmacist

## 2020-10-21 NOTE — Progress Notes (Addendum)
Chronic Care Management Pharmacy Assistant   Name: Rachel [name]  MRN: [id number] DOB: 09-Feb-1946  Reason for Encounter: Disease State For HTN.    Conditions to be addressed/monitored: HTN, Allergic Rhinitis, GERD, Osteoporosis, HLD  Recent office visits: 09/25/20 Eulogio Bear, NP. For follow-up. No medication changes.   08/05/20 Dr. Buelah Manis For left breast mass. No medication changes.   Recent consult visits:  08/20/20 Orthopedic Surgery Marybelle Killings, MD. For follow-up. No medication changes.  08/17/20 Cardiothoracic Surgery Wonda Olds, MD. For thoracic aortic aneurysm without rupture. STARTED Amlodipine 5 mg daily.  07/23/20 Orthopedic Surgery Marybelle Killings, MD. For follow-up. No medication changes.   Hospital visits:   09/26/20 [facility] [place], Delma Officer, MD For Emesis. Labs drawn. EKG and CT preformed. No medication changes.  Medications: Outpatient Encounter Medications as of 10/21/2020  Medication Sig   amLODipine (NORVASC) 5 MG tablet Take 1 tablet (5 mg total) by mouth daily.   Ascorbic Acid (VITA-C PO) Take by mouth.   atorvastatin (LIPITOR) 10 MG tablet Take 1 tablet (10 mg total) by mouth daily.   cholecalciferol (VITAMIN D3) 25 MCG (1000 UNIT) tablet Take 1,000 Units by mouth daily.   docusate sodium (COLACE) 100 MG capsule TAKE 1 CAPSULE BY MOUTH EVERY 12 HOURS (Patient taking differently: Take 100-200 mg by mouth daily as needed for mild constipation.)   famotidine (PEPCID) 20 MG tablet TAKE 1 TABLET BY MOUTH TWICE A DAY   lisinopril (ZESTRIL) 20 MG tablet Take 1 tablet (20 mg total) by mouth daily.   magnesium citrate solution Take 74-148 mLs by mouth daily as needed (constipation).   Multiple Vitamin (MULTIVITAMIN WITH MINERALS) TABS tablet Take 1 tablet by mouth daily.   pantoprazole (PROTONIX) 40 MG tablet TAKE 1 TABLET BY MOUTH  DAILY   propranolol ER (INDERAL LA) 60 MG 24 hr capsule TAKE 1 CAPSULE BY MOUTH  DAILY FOR BLOOD PRESSURE    sertraline (ZOLOFT) 25 MG tablet Take 1 tablet (25 mg total) by mouth at bedtime. For anxiety   vitamin E 180 MG (400 UNITS) capsule Take 400 Units by mouth daily.   No facility-administered encounter medications on file as of 10/21/2020.    Reviewed chart prior to disease state call. Spoke with patient regarding BP  Recent Office Vitals: BP Readings from Last 3 Encounters:  09/25/20 138/80  08/17/20 (!) 172/104  08/05/20 (!) 148/82   Pulse Readings from Last 3 Encounters:  09/25/20 67  08/17/20 68  08/05/20 68    Wt Readings from Last 3 Encounters:  09/25/20 163 lb 12.8 oz (74.3 kg)  08/17/20 162 lb (73.5 kg)  08/05/20 163 lb (73.9 kg)     Kidney Function Lab Results  Component Value Date/Time   CREATININE 0.99 (H) 09/25/2020 04:41 PM   CREATININE 1.14 (H) 04/17/2020 04:23 PM   GFRNONAA 56 (L) 09/25/2020 04:41 PM   GFRAA 65 09/25/2020 04:41 PM    BMP Latest Ref Rng & Units 09/25/2020 04/17/2020 10/10/2019  Glucose 65 - 99 mg/dL 98 94 101(H)  BUN 7 - 25 mg/dL 15 16 15   Creatinine 0.60 - 0.93 mg/dL 0.99(H) 1.14(H) 0.80  BUN/Creat Ratio 6 - 22 (calc) 15 14 -  Sodium 135 - 146 mmol/L 140 140 140  Potassium 3.5 - 5.3 mmol/L 4.2 4.2 3.8  Chloride 98 - 110 mmol/L 106 105 107  CO2 20 - 32 mmol/L 24 25 -  Calcium 8.6 - 10.4 mg/dL 10.5(H) 10.2 -  Current antihypertensive regimen:  Amlodipine 10 mg 1 tablet daily  Lisinopril 20 mg 1 tablet daily Propanol 60 mg 1 capsule daily  How often are you checking your Blood Pressure? Patient stated 1-2x per week   Current home BP readings: Patient stated she does not write them down but her blood pressure readings have been within normal range.   What recent interventions/DTPs have been made by any provider to improve Blood Pressure control since last CPP Visit: None  Any recent hospitalizations or ED visits since last visit with CPP? Yes, documented above.  What diet changes have been made to improve Blood Pressure Control?   Patient stated she stays away from fired foods. And tries to eat smaller portions. Patient stated she does make most of her meals at home   What exercise is being done to improve your Blood Pressure Control?  Patient stated she does all of her house chores on her own.   Adherence Review: Is the patient currently on ACE/ARB medication? Lisinopril 20 mg   Does the patient have >5 day gap between last estimated fill dates? Per misc rpts, no.  Star Rating Drugs: Atorvastatin 10 mg 90 DS 09/29/20 Lisinopril 20 mg 90 DS 08/24/20  Follow-Up:Pharmacist Review  Charlann Lange, RMA Clinical Pharmacist Assistant [phone]  10 minutes spent in review, coordination, and documentation.  Reviewed by: Beverly Milch, PharmD Clinical Pharmacist [facility] [phone]

## 2020-10-22 DIAGNOSIS — Z23 Encounter for immunization: Secondary | ICD-10-CM | POA: Diagnosis not present

## 2020-11-06 NOTE — Telephone Encounter (Signed)
error 

## 2020-11-11 ENCOUNTER — Other Ambulatory Visit: Payer: Self-pay | Admitting: *Deleted

## 2020-11-11 MED ORDER — PROPRANOLOL HCL ER 60 MG PO CP24: ORAL_CAPSULE | ORAL | 0 refills | Status: DC

## 2020-11-30 ENCOUNTER — Other Ambulatory Visit: Payer: Self-pay | Admitting: Nurse Practitioner

## 2020-12-09 ENCOUNTER — Other Ambulatory Visit: Payer: Self-pay

## 2020-12-09 ENCOUNTER — Ambulatory Visit (INDEPENDENT_AMBULATORY_CARE_PROVIDER_SITE_OTHER): Payer: Medicare Other | Admitting: Nurse Practitioner

## 2020-12-09 ENCOUNTER — Encounter: Payer: Self-pay | Admitting: Nurse Practitioner

## 2020-12-09 VITALS — BP 138/90 | HR 65 | Temp 98.8°F | Ht 64.0 in | Wt 165.4 lb

## 2020-12-09 DIAGNOSIS — I10 Essential (primary) hypertension: Secondary | ICD-10-CM | POA: Diagnosis not present

## 2020-12-09 DIAGNOSIS — F419 Anxiety disorder, unspecified: Secondary | ICD-10-CM | POA: Diagnosis not present

## 2020-12-09 DIAGNOSIS — Z23 Encounter for immunization: Secondary | ICD-10-CM

## 2020-12-09 DIAGNOSIS — Z1211 Encounter for screening for malignant neoplasm of colon: Secondary | ICD-10-CM

## 2020-12-09 DIAGNOSIS — Z720 Tobacco use: Secondary | ICD-10-CM

## 2020-12-09 DIAGNOSIS — E785 Hyperlipidemia, unspecified: Secondary | ICD-10-CM

## 2020-12-09 DIAGNOSIS — R5383 Other fatigue: Secondary | ICD-10-CM

## 2020-12-09 DIAGNOSIS — E559 Vitamin D deficiency, unspecified: Secondary | ICD-10-CM | POA: Diagnosis not present

## 2020-12-09 DIAGNOSIS — K219 Gastro-esophageal reflux disease without esophagitis: Secondary | ICD-10-CM | POA: Diagnosis not present

## 2020-12-09 DIAGNOSIS — R7309 Other abnormal glucose: Secondary | ICD-10-CM | POA: Diagnosis not present

## 2020-12-09 MED ORDER — SHINGRIX 50 MCG/0.5ML IM SUSR: 0.5000 mL | Freq: Once | INTRAMUSCULAR | 1 refills | Status: AC

## 2020-12-09 MED ORDER — LISINOPRIL 20 MG PO TABS: 10.0000 mg | ORAL_TABLET | Freq: Every day | ORAL | 1 refills | Status: DC

## 2020-12-09 MED ORDER — SERTRALINE HCL 25 MG PO TABS: 25.0000 mg | ORAL_TABLET | Freq: Every day | ORAL | 0 refills | Status: DC

## 2020-12-09 NOTE — Progress Notes (Signed)
Subjective:    Patient ID: Rachel [name], female    DOB: 1946-01-12, 75 y.o.   MRN: [id number]  HPI: Rachel [name] is a 75 y.o. female presenting for follow up.  Chief Complaint  Patient presents with   Follow-up   Hypertension    Refilll on lisinopril, has not taken the lisinopril in a while   Dizziness    Taking meclizine, does not take the medication daily   Since Sunday, there are moments where she can feel dizzy again. She thinks the meclizine helps with the dizziness.  Has taken only once since discharge from the ED.  HYPERTENSION / HYPERLIPIDEMIA Currently taking amlodipine 5 mg, lisinopril 20 mg daily, atorvastatin 10 mg daily for cholesterol. Satisfied with current treatment? yes Duration of hypertension: chronic BP monitoring frequency: daily BP range: 128/82 BP medication side effects: no Duration of hyperlipidemia: chronic Cholesterol medication side effects: no Aspirin: no Recent stressors: no Recurrent headaches: no Visual changes: no Palpitations: no Dyspnea: no Chest pain: no Lower extremity edema: no Dizzy/lightheaded: no  GERD Currently taking famotidine 20 mg twice daily and Protonix 40 mg daily.  Reports good control of her symptoms. Dysphagia: no Odynophagia:  no Hematemesis: no Blood in stool: no EGD: no  ANXIETY/STRESS Did not start taking the Zoloft because she was nervous about it.  Reports anxiety is somewhat better.  Patient is also reporting ongoing fatigue and is requesting a medication to help with her energy.  No Known Allergies  Outpatient Encounter Medications as of 12/09/2020  Medication Sig   amLODipine (NORVASC) 5 MG tablet Take 1 tablet (5 mg total) by mouth daily.   Ascorbic Acid (VITA-C PO) Take by mouth.   atorvastatin (LIPITOR) 10 MG tablet TAKE 1 TABLET BY MOUTH  DAILY   docusate sodium (COLACE) 100 MG capsule TAKE 1 CAPSULE BY MOUTH EVERY 12 HOURS (Patient taking differently: Take 100-200 mg by mouth daily as  needed for mild constipation.)   famotidine (PEPCID) 20 MG tablet TAKE 1 TABLET BY MOUTH TWICE A DAY   magnesium citrate solution Take 74-148 mLs by mouth daily as needed (constipation).   meclizine (ANTIVERT) 25 MG tablet Take 25 mg by mouth 3 (three) times daily as needed for dizziness.   pantoprazole (PROTONIX) 40 MG tablet TAKE 1 TABLET BY MOUTH  DAILY   propranolol ER (INDERAL LA) 60 MG 24 hr capsule TAKE 1 CAPSULE BY MOUTH  DAILY FOR BLOOD PRESSURE   vitamin E 180 MG (400 UNITS) capsule Take 400 Units by mouth daily.   [EXPIRED] Zoster Vaccine Adjuvanted [facility]) injection Inject 0.5 mLs into the muscle once for 1 dose. Administer second dose 2-6 months after first dose.   [DISCONTINUED] cholecalciferol (VITAMIN D3) 25 MCG (1000 UNIT) tablet Take 1,000 Units by mouth daily.   [DISCONTINUED] lisinopril (ZESTRIL) 20 MG tablet Take 1 tablet (20 mg total) by mouth daily.   [DISCONTINUED] Multiple Vitamin (MULTIVITAMIN WITH MINERALS) TABS tablet Take 1 tablet by mouth daily.   [DISCONTINUED] sertraline (ZOLOFT) 25 MG tablet Take 1 tablet (25 mg total) by mouth at bedtime. For anxiety   lisinopril (ZESTRIL) 20 MG tablet Take 0.5 tablets (10 mg total) by mouth daily.   sertraline (ZOLOFT) 25 MG tablet Take 1 tablet (25 mg total) by mouth at bedtime. For anxiety   No facility-administered encounter medications on file as of 12/09/2020.    Patient Active Problem List   Diagnosis Date Noted   Chronic kidney disease, stage 3a ([place]) 12/11/2020  Calcaneus fracture 10/17/2019   Hyperlipidemia 07/31/2019   S/P lumbar fusion 02/21/2019   Spinal stenosis of lumbar region 02/13/2019   DDD (degenerative disc disease), lumbar 07/06/2018   Osteoporosis 04/24/2018   Encounter for gynecological examination with Papanicolaou smear of cervix 09/08/2015   Abnormal Pap smear of vagina 10/15/2014   Acquired trigger finger 09/03/2014   Constipation 04/04/2014   VAIN (vaginal intraepithelial neoplasia)  06/20/2013   Knee pain, right 06/14/2013   Allergic rhinitis 08/22/2012   Neuropathy, arm 02/19/2012   GERD (gastroesophageal reflux disease) 02/19/2012   Alopecia 02/19/2012   Anxiety 10/03/2011   Tremor 10/03/2011   Tobacco use 10/03/2011   OA (osteoarthritis) 08/11/2011   Cancer ([place]) 08/11/2011   Essential hypertension, benign 08/09/2011   Vitamin D deficiency 08/09/2011    Past Medical History:  Diagnosis Date   Back pain    Carpal tunnel syndrome    Constipation    Hypertension    Low grade squamous intraepith lesion on cytologic smear cervix (lgsil)    +hpv, HAD HYSTERECTOMY   OA (osteoarthritis)     Relevant past medical, surgical, family and social history reviewed and updated as indicated. Interim medical history since our last visit reviewed.  Review of Systems Per HPI unless specifically indicated above     Objective:    BP 138/90   Pulse 65   Temp 98.8 F (37.1 C)   Ht 5\' 4"  (1.626 m)   Wt 165 lb 6.4 oz (75 kg)   SpO2 96%   BMI 28.39 kg/m   Wt Readings from Last 3 Encounters:  12/09/20 165 lb 6.4 oz (75 kg)  09/25/20 163 lb 12.8 oz (74.3 kg)  08/17/20 162 lb (73.5 kg)    Physical Exam Vitals and nursing note reviewed.  Constitutional:      General: She is not in acute distress.    Appearance: Normal appearance. She is not toxic-appearing.  HENT:     Head: Normocephalic and atraumatic.     Right Ear: External ear normal.     Left Ear: External ear normal.  Eyes:     General: No scleral icterus.    Extraocular Movements: Extraocular movements intact.  Neck:     Vascular: No carotid bruit.  Cardiovascular:     Rate and Rhythm: Normal rate and regular rhythm.     Heart sounds: Normal heart sounds. No murmur heard. Pulmonary:     Effort: Pulmonary effort is normal. No respiratory distress.     Breath sounds: Normal breath sounds. No wheezing, rhonchi or rales.  Abdominal:     General: Abdomen is flat. Bowel sounds are normal.      Palpations: Abdomen is soft.     Tenderness: There is no abdominal tenderness.  Musculoskeletal:        General: No swelling or tenderness. Normal range of motion.     Cervical back: Normal range of motion.  Skin:    General: Skin is warm and dry.     Capillary Refill: Capillary refill takes less than 2 seconds.     Coloration: Skin is not jaundiced.     Findings: No bruising.  Neurological:     General: No focal deficit present.     Mental Status: She is alert and oriented to person, place, and time.     Motor: No weakness.     Gait: Gait normal.  Psychiatric:        Mood and Affect: Mood normal.  Behavior: Behavior normal.        Thought Content: Thought content normal.        Judgment: Judgment normal.      Assessment & Plan:   Problem List Items Addressed This Visit       Cardiovascular and Mediastinum   Essential hypertension, benign - Primary (Chronic)    Chronic.  Plan to continue loaded pain 5 mg daily.  Resume lisinopril at half dose-10 mg daily.  Continue checking blood pressure at home and notify us with readings consistently greater than 130/80 or consistently less than 100/60.  We will check electrolytes and kidney function with liver enzymes today.  Follow-up in 6 months.       Relevant Medications   lisinopril (ZESTRIL) 20 MG tablet   Other Relevant Orders   COMPLETE METABOLIC PANEL WITH GFR (Completed)   CBC with Differential/Platelet (Completed)     Digestive   GERD (gastroesophageal reflux disease) (Chronic)    Chronic.  Stable with pantoprazole 40 mg daily.  Plan to continue this medication for now.  Follow-up in 6 months.       Relevant Medications   meclizine (ANTIVERT) 25 MG tablet   Other Relevant Orders   CBC with Differential/Platelet (Completed)     Other   Tobacco use (Chronic)    Encouraged cessation-she is trying to cut back.       Hyperlipidemia    Chronic.  Tolerating atorvastatin 10 mg daily well.  Will check liver enzymes  and plan to recheck lipids in 3 to 6 months.       Relevant Medications   lisinopril (ZESTRIL) 20 MG tablet   Anxiety    Chronic.  She did not start the sertraline, but is willing to today.  We will resend a new prescription and follow-up in 4 weeks.       Relevant Medications   sertraline (ZOLOFT) 25 MG tablet   Other Relevant Orders   TSH (Completed)   Other Visit Diagnoses     Need for shingles vaccine       Screening for colon cancer       Relevant Orders   Ambulatory referral to Gastroenterology   Other fatigue       We will check screening labs today.  Given snoring, will also check sleep study.  Follow-up pending results.   Relevant Orders   VITAMIN D 25 Hydroxy (Vit-D Deficiency, Fractures) (Completed)   Hemoglobin A1c (Completed)   Ambulatory referral to Sleep Studies   Hypercalcemia       Relevant Orders   PTH, Intact and Calcium        Follow up plan: Return in about 4 weeks (around 01/06/2021) for Mood.

## 2020-12-10 LAB — COMPLETE METABOLIC PANEL WITH GFR
AG Ratio: 1.4 (calc) (ref 1.0–2.5)
ALT: 21 U/L (ref 6–29)
AST: 23 U/L (ref 10–35)
Albumin: 4.4 g/dL (ref 3.6–5.1)
Alkaline phosphatase (APISO): 63 U/L (ref 37–153)
BUN/Creatinine Ratio: 12 (calc) (ref 6–22)
BUN: 13 mg/dL (ref 7–25)
CO2: 24 mmol/L (ref 20–32)
Calcium: 11 mg/dL — ABNORMAL HIGH (ref 8.6–10.4)
Chloride: 105 mmol/L (ref 98–110)
Creat: 1.07 mg/dL — ABNORMAL HIGH (ref 0.60–1.00)
Globulin: 3.2 g/dL (calc) (ref 1.9–3.7)
Glucose, Bld: 104 mg/dL — ABNORMAL HIGH (ref 65–99)
Potassium: 4.6 mmol/L (ref 3.5–5.3)
Sodium: 140 mmol/L (ref 135–146)
Total Bilirubin: 0.8 mg/dL (ref 0.2–1.2)
Total Protein: 7.6 g/dL (ref 6.1–8.1)
eGFR: 55 mL/min/{1.73_m2} — ABNORMAL LOW (ref >=60)

## 2020-12-10 LAB — VITAMIN D 25 HYDROXY (VIT D DEFICIENCY, FRACTURES): Vit D, 25-Hydroxy: 45 ng/mL (ref 30–100)

## 2020-12-10 LAB — CBC WITH DIFFERENTIAL/PLATELET
Absolute Monocytes: 632 cells/uL (ref 200–950)
Basophils Absolute: 58 cells/uL (ref 0–200)
Basophils Relative: 1 %
Eosinophils Absolute: 81 cells/uL (ref 15–500)
Eosinophils Relative: 1.4 %
HCT: 39.4 % (ref 35.0–45.0)
Hemoglobin: 12.9 g/dL (ref 11.7–15.5)
Lymphs Abs: 1589 cells/uL (ref 850–3900)
MCH: 28.3 pg (ref 27.0–33.0)
MCHC: 32.7 g/dL (ref 32.0–36.0)
MCV: 86.4 fL (ref 80.0–100.0)
MPV: 12.6 fL — ABNORMAL HIGH (ref 7.5–12.5)
Monocytes Relative: 10.9 %
Neutro Abs: 3439 cells/uL (ref 1500–7800)
Neutrophils Relative %: 59.3 %
Platelets: 209 10*3/uL (ref 140–400)
RBC: 4.56 10*6/uL (ref 3.80–5.10)
RDW: 13.1 % (ref 11.0–15.0)
Total Lymphocyte: 27.4 %
WBC: 5.8 10*3/uL (ref 3.8–10.8)

## 2020-12-10 LAB — HEMOGLOBIN A1C
Hgb A1c MFr Bld: 5.3 % of total Hgb (ref <5.7)
Mean Plasma Glucose: 105 mg/dL
eAG (mmol/L): 5.8 mmol/L

## 2020-12-10 LAB — TSH: TSH: 0.7 mIU/L (ref 0.40–4.50)

## 2020-12-11 ENCOUNTER — Encounter: Payer: Self-pay | Admitting: Nurse Practitioner

## 2020-12-11 DIAGNOSIS — N1831 Chronic kidney disease, stage 3a: Secondary | ICD-10-CM | POA: Insufficient documentation

## 2020-12-11 NOTE — Progress Notes (Signed)
Pt vm is full. Called duaghter, lm for call bk on lab results

## 2020-12-11 NOTE — Assessment & Plan Note (Signed)
Chronic.  Plan to continue loaded pain 5 mg daily.  Resume lisinopril at half dose-10 mg daily.  Continue checking blood pressure at home and notify us with readings consistently greater than 130/80 or consistently less than 100/60.  We will check electrolytes and kidney function with liver enzymes today.  Follow-up in 6 months.

## 2020-12-11 NOTE — Assessment & Plan Note (Signed)
Chronic.  She did not start the sertraline, but is willing to today.  We will resend a new prescription and follow-up in 4 weeks.

## 2020-12-11 NOTE — Assessment & Plan Note (Signed)
Chronic.  Stable with pantoprazole 40 mg daily.  Plan to continue this medication for now.  Follow-up in 6 months.

## 2020-12-11 NOTE — Assessment & Plan Note (Signed)
Encouraged cessation-she is trying to cut back.

## 2020-12-11 NOTE — Assessment & Plan Note (Signed)
Chronic.  Tolerating atorvastatin 10 mg daily well.  Will check liver enzymes and plan to recheck lipids in 3 to 6 months.

## 2020-12-30 ENCOUNTER — Other Ambulatory Visit: Payer: Self-pay | Admitting: Cardiothoracic Surgery

## 2020-12-30 DIAGNOSIS — I712 Thoracic aortic aneurysm, without rupture, unspecified: Secondary | ICD-10-CM

## 2020-12-31 NOTE — Progress Notes (Signed)
Chief complaint: Aneurysm surveillance History of present illness: 75 year old lady with a history of incidentally discovered ascending aortic aneurysm presents for 46-monthfollow-up.  Since her last visitation she has been stable and is improving from her trauma that led to the diagnosis in the first place.  She is treated for hypertension; she has no personal or family history of aneurysm disease  Active Ambulatory Problems    Diagnosis Date Noted   Essential hypertension, benign 08/09/2011   Vitamin D deficiency 08/09/2011   OA (osteoarthritis) 08/11/2011   [place] 08/11/[place]   Anxiety 10/03/2011   Tremor 10/03/2011   Tobacco use 10/03/2011   Neuropathy, arm 02/19/2012   GERD (gastroesophageal reflux disease) 02/19/2012   Alopecia 02/19/2012   Allergic rhinitis 08/22/2012   Knee pain, right 06/14/2013   VAIN (vaginal intraepithelial neoplasia) 06/20/2013   Constipation 04/04/2014   Acquired trigger finger 09/03/2014   Abnormal Pap smear of vagina 10/15/2014   Encounter for gynecological examination with Papanicolaou smear of cervix 09/08/2015   Osteoporosis 04/24/2018   DDD (degenerative disc disease), lumbar 07/06/2018   Spinal stenosis of lumbar region 02/13/2019   S/P lumbar fusion 02/21/2019   Hyperlipidemia 07/31/2019   Calcaneus fracture 10/17/2019   Ch[place] disease[place] 3a [place] 12/11/2020   Resolved Ambulatory Problems    Diagnosis Date Noted   Sinusitis, acute 05/01/2012   Abdominal pain, unspecified site 05/01/2012   Spinal stenosis of lumbar region with neurogenic claudication 08/10/2018   Foraminal stenosis of lumbar region 08/10/2018   Past Medical History:  Diagnosis Date   Back pain    Carpal tunnel syndrome    Hypertension    Low grade squamous intraepith lesion on cytologic smear cervix (lgsil)    Current Outpatient Medications on File Prior to Visit  Medication Sig Dispense Refill   Ascorbic Acid (VITA-C PO) Take by mouth.     docusate  sodium (COLACE) 100 MG capsule TAKE 1 CAPSULE BY MOUTH EVERY 12 HOURS (Patient taking differently: Take 100-200 mg by mouth daily as needed for mild constipation.) 60 capsule 0   famotidine (PEPCID) 20 MG tablet TAKE 1 TABLET BY MOUTH TWICE A DAY 30 tablet 1   magnesium citrate solution Take 74-148 mLs by mouth daily as needed (constipation).     pantoprazole (PROTONIX) 40 MG tablet TAKE 1 TABLET BY MOUTH  DAILY 90 tablet 3   vitamin E 180 MG (400 UNITS) capsule Take 400 Units by mouth daily.     No current facility-administered medications on file prior to visit.   Physical examination: BP (!) 172/104 (BP Location: Right Arm, Patient Position: Sitting)   Pulse 68   Resp 20   Ht '5\' 4"'$  (1.626 m)   Wt 73.5 kg   SpO2 94% Comment: RA  BMI 27.81 kg/m  Well-appearing no acute distress Chest clear to auscultation Heart regular rate and rhythm Extremities warm and well-perfused  Imaging: I have personally reviewed her available CT scan from 08/17/2020 which shows a stable ascending aortic aneurysm measuring less than 5 cm in maximal diameter  Assessment/plan: Stable sub-5 cm ascending aortic aneurysm posing low risk for urgent aortic condition and not warranting surgical intervention at this time Follow-up in 1 year Erie Sica Z. AOrvan Seen MCoats

## 2021-01-13 ENCOUNTER — Other Ambulatory Visit: Payer: Self-pay | Admitting: *Deleted

## 2021-01-13 DIAGNOSIS — I10 Essential (primary) hypertension: Secondary | ICD-10-CM

## 2021-01-13 MED ORDER — LISINOPRIL 20 MG PO TABS: 10.0000 mg | ORAL_TABLET | Freq: Every day | ORAL | 1 refills | Status: DC

## 2021-01-15 ENCOUNTER — Telehealth: Payer: Self-pay | Admitting: Pharmacist

## 2021-01-15 NOTE — Progress Notes (Addendum)
Chronic Care Management Pharmacy Assistant   Name: Rachel [name]  MRN: [id number] DOB: 01-Sep-1945  Reason for Encounter: Disease State For HTN.   Conditions to be addressed/monitored: HTN, Allergic Rhinitis, GERD, Osteoporosis, HLD  Recent office visits:  12/09/20 Eulogio Bear, NP. For follow-up. STOPPED Multiple vitamins and Cholecalciferol. CHANGED/DECREASED Lisinopril to 10 mg daily. Per note: Zoster Vaccine was administered.   Recent consult visits:  None since 10/21/20  Hospital visits:  None since 10/21/20  Medications: Outpatient Encounter Medications as of 01/15/2021  Medication Sig   amLODipine (NORVASC) 5 MG tablet Take 1 tablet (5 mg total) by mouth daily.   Ascorbic Acid (VITA-C PO) Take by mouth.   atorvastatin (LIPITOR) 10 MG tablet TAKE 1 TABLET BY MOUTH  DAILY   docusate sodium (COLACE) 100 MG capsule TAKE 1 CAPSULE BY MOUTH EVERY 12 HOURS (Patient taking differently: Take 100-200 mg by mouth daily as needed for mild constipation.)   famotidine (PEPCID) 20 MG tablet TAKE 1 TABLET BY MOUTH TWICE A DAY   lisinopril (ZESTRIL) 20 MG tablet Take 0.5 tablets (10 mg total) by mouth daily.   magnesium citrate solution Take 74-148 mLs by mouth daily as needed (constipation).   meclizine (ANTIVERT) 25 MG tablet Take 25 mg by mouth 3 (three) times daily as needed for dizziness.   pantoprazole (PROTONIX) 40 MG tablet TAKE 1 TABLET BY MOUTH  DAILY   propranolol ER (INDERAL LA) 60 MG 24 hr capsule TAKE 1 CAPSULE BY MOUTH  DAILY FOR BLOOD PRESSURE   sertraline (ZOLOFT) 25 MG tablet Take 1 tablet (25 mg total) by mouth at bedtime. For anxiety   vitamin E 180 MG (400 UNITS) capsule Take 400 Units by mouth daily.   No facility-administered encounter medications on file as of 01/15/2021.   Reviewed chart prior to disease state call. Spoke with patient regarding BP  Recent Office Vitals: BP Readings from Last 3 Encounters:  12/09/20 138/90  09/25/20 138/80  08/17/20 (!)  172/104   Pulse Readings from Last 3 Encounters:  12/09/20 65  09/25/20 67  08/17/20 68    Wt Readings from Last 3 Encounters:  12/09/20 165 lb 6.4 oz (75 kg)  09/25/20 163 lb 12.8 oz (74.3 kg)  08/17/20 162 lb (73.5 kg)     Kidney Function Lab Results  Component Value Date/Time   CREATININE 1.07 (H) 12/09/2020 04:30 PM   CREATININE 0.99 (H) 09/25/2020 04:41 PM   GFRNONAA 56 (L) 09/25/2020 04:41 PM   GFRAA 65 09/25/2020 04:41 PM    BMP Latest Ref Rng & Units 12/09/2020 09/25/2020 04/17/2020  Glucose 65 - 99 mg/dL 104(H) 98 94  BUN 7 - 25 mg/dL '13 15 16  '$ Creatinine 0.60 - 1.00 mg/dL 1.07(H) 0.99(H) 1.14(H)  BUN/Creat Ratio 6 - 22 (calc) '12 15 14  '$ Sodium 135 - 146 mmol/L 140 140 140  Potassium 3.5 - 5.3 mmol/L 4.6 4.2 4.2  Chloride 98 - 110 mmol/L 105 106 105  CO2 20 - 32 mmol/L '24 24 25  '$ Calcium 8.6 - 10.4 mg/dL 11.0(H) 10.5(H) 10.2    Current antihypertensive regimen:  Amlodipine 10 mg 1 tablet daily  Lisinopril 20 mg 1 tablet daily Propanol 60 mg 1 capsule daily  How often are you checking your Blood Pressure? Patient stated daily  Current home BP readings:  129/81 01/15/21 133/85 01/14/21 131/81 01/13/21 110/64 01/12/21  What recent interventions/DTPs have been made by any provider to improve Blood Pressure control since last CPP Visit: None.  Any recent hospitalizations or ED visits since last visit with CPP? Patient stated no.   What diet changes have been made to improve Blood Pressure Control?  Patient stated not much has changed with her diet but sometimes she does eat fried foods.   What exercise is being done to improve your Blood Pressure Control?  Patient stated she does her daily basic house chores.   Adherence Review: Is the patient currently on ACE/ARB medication? Lisinopril 20 mg   Does the patient have >5 day gap between last estimated fill dates? Per misc rpts, yes.   Star Rating Drugs: Lisinopril 20 mg 90 DS 12/09/20, Atorvastatin 10 mg 90 DS  09/29/20( We discussed her refills on her Atorvastatin and she did run out of her medication and was not sure if she was supposed to continue. She has been informed to get a refill on this medication.)  Follow-Up:Pharmacist Review  Patient stated she does not have any questions or concerns about her medications at this time.    Patient lost the number for her referral to get a colonoscopy so I sent a message to the nurse so the patient can schedule her appointment.    Charlann Lange, RMA Clinical Pharmacist Assistant [phone]  10 minutes spent in review, coordination, and documentation.  Reviewed by: Beverly Milch, PharmD Clinical Pharmacist [phone]

## 2021-01-19 ENCOUNTER — Encounter (INDEPENDENT_AMBULATORY_CARE_PROVIDER_SITE_OTHER): Payer: Self-pay | Admitting: *Deleted

## 2021-02-03 ENCOUNTER — Other Ambulatory Visit: Payer: Self-pay | Admitting: Family Medicine

## 2021-02-10 ENCOUNTER — Other Ambulatory Visit: Payer: Self-pay | Admitting: *Deleted

## 2021-02-10 MED ORDER — PANTOPRAZOLE SODIUM 40 MG PO TBEC: 40.0000 mg | DELAYED_RELEASE_TABLET | Freq: Every day | ORAL | 3 refills | Status: DC

## 2021-02-17 ENCOUNTER — Other Ambulatory Visit: Payer: Self-pay | Admitting: Cardiothoracic Surgery

## 2021-02-18 ENCOUNTER — Ambulatory Visit (INDEPENDENT_AMBULATORY_CARE_PROVIDER_SITE_OTHER): Payer: Medicare Other | Admitting: Physician Assistant

## 2021-02-18 ENCOUNTER — Ambulatory Visit
Admission: RE | Admit: 2021-02-18 | Discharge: 2021-02-18 | Disposition: A | Discharge status: Home / Self Care | Payer: Medicare Other | Source: Ambulatory Visit | Attending: Cardiothoracic Surgery | Admitting: Cardiothoracic Surgery

## 2021-02-18 ENCOUNTER — Other Ambulatory Visit: Payer: Self-pay

## 2021-02-18 ENCOUNTER — Encounter: Payer: Self-pay | Admitting: Physician Assistant

## 2021-02-18 VITALS — BP 145/88 | HR 80 | Resp 20 | Ht 64.0 in | Wt 166.0 lb

## 2021-02-18 DIAGNOSIS — I700 Atherosclerosis of aorta: Secondary | ICD-10-CM | POA: Diagnosis not present

## 2021-02-18 DIAGNOSIS — I712 Thoracic aortic aneurysm, without rupture, unspecified: Secondary | ICD-10-CM

## 2021-02-18 DIAGNOSIS — J439 Emphysema, unspecified: Secondary | ICD-10-CM | POA: Diagnosis not present

## 2021-02-18 NOTE — Patient Instructions (Addendum)
Fluoroquinolones  No heavy lifting-bearing down causes pressure in the aorta.   No bearing down  Maintain BP control  Low-sodium diet < 2G a day
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
## 2021-02-25 ENCOUNTER — Encounter: Payer: Self-pay | Admitting: Orthopaedic Surgery

## 2021-02-25 ENCOUNTER — Ambulatory Visit (INDEPENDENT_AMBULATORY_CARE_PROVIDER_SITE_OTHER): Payer: Medicare Other | Admitting: Orthopaedic Surgery

## 2021-02-25 ENCOUNTER — Other Ambulatory Visit: Payer: Self-pay

## 2021-02-25 ENCOUNTER — Telehealth: Payer: Self-pay | Admitting: Nurse Practitioner

## 2021-02-25 VITALS — Ht 64.5 in | Wt 164.0 lb

## 2021-02-25 DIAGNOSIS — M5459 Other low back pain: Secondary | ICD-10-CM | POA: Diagnosis not present

## 2021-02-25 DIAGNOSIS — S92001D Unspecified fracture of right calcaneus, subsequent encounter for fracture with routine healing: Secondary | ICD-10-CM

## 2021-02-25 DIAGNOSIS — Z981 Arthrodesis status: Secondary | ICD-10-CM

## 2021-02-25 NOTE — Telephone Encounter (Signed)
Patient left voicemail message to request call back; no details in message.  Placed outbound call to patient; patient wants to know if she's due for a flu shot or a shingles vaccine.   Spoke with nurse Betzy. Stated no record of shingles vaccine in patient's chart; ok for patient to receive flu shot. Patient advised and is aware. Patient also advised to check with her local pharmacies for record of shingles shot (patient stated she thinks it was received in 2019).  Patient stated she's scheduled for a booster today and wants to know if it's ok to get the flu shot at the same time. Nurse Lennie Odor recommends patient space shots apart by one month. Patient advised and is aware.

## 2021-02-26 ENCOUNTER — Telehealth: Payer: Self-pay | Admitting: Nurse Practitioner

## 2021-02-26 DIAGNOSIS — F419 Anxiety disorder, unspecified: Secondary | ICD-10-CM

## 2021-02-26 MED ORDER — SERTRALINE HCL 25 MG PO TABS: 25.0000 mg | ORAL_TABLET | Freq: Every day | ORAL | 0 refills | Status: DC

## 2021-02-26 NOTE — Telephone Encounter (Signed)
Pharmacy:[facility]  Medication:sertaline hcl tab  Qty:90  Physician:Jessica Edsel Petrin   Patient's phone number:[phone]

## 2021-03-02 ENCOUNTER — Encounter: Payer: Self-pay | Admitting: Orthopaedic Surgery

## 2021-03-02 NOTE — Progress Notes (Signed)
Office Visit Note   Patient: Rachel [name]           Date of Birth: 1946/05/21           MRN: [id number] Visit Date: 02/25/2021              Requested by: Eulogio Bear, NP [address]  [place] [address] PCP: Eulogio Bear, NP   Assessment & Plan: Visit Diagnoses:  1. Closed nondisplaced fracture of right calcaneus with routine healing, unspecified portion of calcaneus, subsequent encounter   2. S/P lumbar fusion     Plan: She gradual increase her walking activity.  Heel continues to give her slight discomfort we discussed length of time calcaneus fractures are usually symptomatic which may be up to a year.  Recheck 6 months.  Follow-Up Instructions: Return in about 6 months (around 08/25/2021).   Orders:  No orders of the defined types were placed in this encounter.  No orders of the defined types were placed in this encounter.     Procedures: No procedures performed   Clinical Data: No additional findings.   Subjective: Chief Complaint  Patient presents with   Lower Back - Follow-up    HPI follow-up visit post lumbar instrumented fusion September 2021 L4-5.  She had some mild to moderate narrowing at L3-4 above the fusion.  Calcaneus fracture which slowed her ambulation for a period of time she had problems with compliance and with weightbearing had further distraction of the calcaneus fracture which eventually finally healed with compliance of nonweightbearing.  Her activity is gradually increasing.  Review of Systems updated unchanged.   Objective: Vital Signs: Ht 5' 4.5" (1.638 m)   Wt 164 lb (74.4 kg)   BMI 27.72 kg/m   Physical Exam Constitutional:      Appearance: She is well-developed.  HENT:     Head: Normocephalic.     Right Ear: External ear normal.     Left Ear: External ear normal. There is no impacted cerumen.  Eyes:     Pupils: Pupils are equal, round, and reactive to light.  Neck:     Thyroid: No thyromegaly.      Trachea: No tracheal deviation.  Cardiovascular:     Rate and Rhythm: Normal rate.  Pulmonary:     Effort: Pulmonary effort is normal.  Abdominal:     Palpations: Abdomen is soft.  Musculoskeletal:     Cervical back: No rigidity.  Skin:    General: Skin is warm and dry.  Neurological:     Mental Status: She is alert and oriented to person, place, and time.  Psychiatric:        Behavior: Behavior normal.    Ortho Exam patient is amatory quads are strong anterior tib is strong.  Well-healed lumbar incision.  Specialty Comments:  No specialty comments available.  Imaging: No results found.   PMFS History: Patient Active Problem List   Diagnosis Date Noted   Chronic kidney disease, stage 3a ([place]) 12/11/2020   Calcaneus fracture 10/17/2019   Hyperlipidemia 07/31/2019   S/P lumbar fusion 02/21/2019   Spinal stenosis of lumbar region 02/13/2019   DDD (degenerative disc disease), lumbar 07/06/2018   Osteoporosis 04/24/2018   Encounter for gynecological examination with Papanicolaou smear of cervix 09/08/2015   Abnormal Pap smear of vagina 10/15/2014   Acquired trigger finger 09/03/2014   Constipation 04/04/2014   VAIN (vaginal intraepithelial neoplasia) 06/20/2013   Knee pain, right 06/14/2013   Allergic  rhinitis 08/22/2012   Neuropathy, arm 02/19/2012   GERD (gastroesophageal reflux disease) 02/19/2012   Alopecia 02/19/2012   Anxiety 10/03/2011   Tremor 10/03/2011   Tobacco use 10/03/2011   OA (osteoarthritis) 08/11/2011   Cancer ([place]) 08/11/2011   Essential hypertension, benign 08/09/2011   Vitamin D deficiency 08/09/2011   Past Medical History:  Diagnosis Date   Back pain    Carpal tunnel syndrome    Constipation    Hypertension    Low grade squamous intraepith lesion on cytologic smear cervix (lgsil)    +hpv, HAD HYSTERECTOMY   OA (osteoarthritis)     Family History  Problem Relation Age of Onset   Diabetes Father    Cancer Father        prostate  cancer   Cancer Sister        breast   Hypertension Son    Cancer Maternal Grandmother    Eczema Daughter    Other Daughter        stomach issues    Past Surgical History:  Procedure Laterality Date   ABDOMINAL HYSTERECTOMY  2006   BREAST CYST EXCISION     right   BUNIONECTOMY     left   CARPAL TUNNEL RELEASE Right    ESOPHAGOGASTRODUODENOSCOPY  06/13/2012   Procedure: ESOPHAGOGASTRODUODENOSCOPY (EGD);  Surgeon: Rogene Houston, MD;  Location: [facility];  Service: Endoscopy;  Laterality: N/A;  200   EYE SURGERY Bilateral 2020   TRIGGER FINGER RELEASE Right 08/25/2016   Procedure: RELEASE TRIGGER FINGER/A-1 PULLEY RIGHT LONG TRIGGER FINGER RELEASE;  Surgeon: Carole Civil, MD;  Location: [facility] ORS;  Service: Orthopedics;  Laterality: Right;   Social History   Occupational History   Not on file  Tobacco Use   Smoking status: Former    Packs/day: 0.25    Years: 50.00    Pack years: 12.50    Types: Cigarettes    Quit date: 07/28/2013    Years since quitting: 7.6   Smokeless tobacco: Never  Vaping Use   Vaping Use: Every day   Substances: Nicotine  Substance and Sexual Activity   Alcohol use: Yes    Alcohol/week: 0.0 standard drinks    Comment: occ   Drug use: No   Sexual activity: Not Currently    Birth control/protection: Surgical    Comment: hyst

## 2021-03-03 ENCOUNTER — Telehealth: Payer: Self-pay | Admitting: Pharmacist

## 2021-03-03 NOTE — Progress Notes (Signed)
Chronic Care Management Pharmacy Assistant   Name: Rachel [name]  MRN: [id number] DOB: 1946-03-30  Reason for Encounter: Disease State For HTN.   Conditions to be addressed/monitored: HTN, Allergic Rhinitis, GERD, Osteoporosis, HLD  Recent office visits:  None since 01/15/21  Recent consult visits:  02/25/21 Orthopedic Surgery Marybelle Killings, MD. For lower back pain. No medication changes.  02/18/21 Cardiothoracic Surgery Elgie Collard, PA-C. For Thoracic Aortic Aneurysm. No medication changes.   Hospital visits:  None since 01/15/21  Medications: Outpatient Encounter Medications as of 03/03/2021  Medication Sig   amLODipine (NORVASC) 5 MG tablet Take 1 tablet (5 mg total) by mouth daily.   Ascorbic Acid (VITA-C PO) Take by mouth.   atorvastatin (LIPITOR) 10 MG tablet TAKE 1 TABLET BY MOUTH  DAILY   docusate sodium (COLACE) 100 MG capsule TAKE 1 CAPSULE BY MOUTH EVERY 12 HOURS (Patient taking differently: Take 100-200 mg by mouth daily as needed for mild constipation.)   famotidine (PEPCID) 20 MG tablet TAKE 1 TABLET BY MOUTH TWICE A DAY   lisinopril (ZESTRIL) 20 MG tablet Take 0.5 tablets (10 mg total) by mouth daily.   magnesium citrate solution Take 74-148 mLs by mouth daily as needed (constipation).   meclizine (ANTIVERT) 25 MG tablet Take 25 mg by mouth 3 (three) times daily as needed for dizziness.   pantoprazole (PROTONIX) 40 MG tablet Take 1 tablet (40 mg total) by mouth daily.   propranolol ER (INDERAL LA) 60 MG 24 hr capsule TAKE 1 CAPSULE BY MOUTH  DAILY FOR BLOOD PRESSURE   sertraline (ZOLOFT) 25 MG tablet Take 1 tablet (25 mg total) by mouth at bedtime. For anxiety   vitamin E 180 MG (400 UNITS) capsule Take 400 Units by mouth daily.   No facility-administered encounter medications on file as of 03/03/2021.   Reviewed chart prior to disease state call. Spoke with patient regarding BP  Recent Office Vitals: BP Readings from Last 3 Encounters:  02/18/21 (!) 145/88   12/09/20 138/90  09/25/20 138/80   Pulse Readings from Last 3 Encounters:  02/18/21 80  12/09/20 65  09/25/20 67    Wt Readings from Last 3 Encounters:  02/25/21 164 lb (74.4 kg)  02/18/21 166 lb (75.3 kg)  12/09/20 165 lb 6.4 oz (75 kg)     Kidney Function Lab Results  Component Value Date/Time   CREATININE 1.07 (H) 12/09/2020 04:30 PM   CREATININE 0.99 (H) 09/25/2020 04:41 PM   GFRNONAA 56 (L) 09/25/2020 04:41 PM   GFRAA 65 09/25/2020 04:41 PM    BMP Latest Ref Rng & Units 12/09/2020 09/25/2020 04/17/2020  Glucose 65 - 99 mg/dL 104(H) 98 94  BUN 7 - 25 mg/dL 13 15 16   Creatinine 0.60 - 1.00 mg/dL 1.07(H) 0.99(H) 1.14(H)  BUN/Creat Ratio 6 - 22 (calc) 12 15 14   Sodium 135 - 146 mmol/L 140 140 140  Potassium 3.5 - 5.3 mmol/L 4.6 4.2 4.2  Chloride 98 - 110 mmol/L 105 106 105  CO2 20 - 32 mmol/L 24 24 25   Calcium 8.6 - 10.4 mg/dL 11.0(H) 10.5(H) 10.2    Current antihypertensive regimen:  Amlodipine 10 mg 1 tablet daily  Lisinopril 20 mg 1 tablet daily Propanol 60 mg 1 capsule daily  How often are you checking your Blood Pressure? Patient stated daily  Current home BP readings: Patient stated her blood pressure readings have been within normal range.   What recent interventions/DTPs have been made by any provider to improve  Blood Pressure control since last CPP Visit: None.  Any recent hospitalizations or ED visits since last visit with CPP? Patient stated no.   What diet changes have been made to improve Blood Pressure Control?  Patient stated her diet is about the same.  What exercise is being done to improve your Blood Pressure Control?  Patient stated she does her daily basic house chores.   Adherence Review: Is the patient currently on ACE/ARB medication? Lisinopril 20 mg   Does the patient have >5 day gap between last estimated fill dates? Per misc rpts, no.   Care Gaps:Patient is due for her colonoscopy.   Star Rating Drugs:Lisinopril 20 mg 02/15/21 90  DS, Atorvastatin 10 mg 02/24/21 90 DS.   Follow-Up:Pharmacist Review  Charlann Lange, [place] Pharmacist Assistant [phone]

## 2021-03-04 ENCOUNTER — Other Ambulatory Visit: Payer: Self-pay

## 2021-03-04 ENCOUNTER — Ambulatory Visit: Payer: Medicare Other | Admitting: Orthopaedic Surgery

## 2021-03-24 ENCOUNTER — Institutional Professional Consult (permissible substitution): Payer: Medicare Other | Admitting: Pulmonary Disease

## 2021-03-25 ENCOUNTER — Other Ambulatory Visit: Payer: Self-pay

## 2021-03-25 ENCOUNTER — Ambulatory Visit (INDEPENDENT_AMBULATORY_CARE_PROVIDER_SITE_OTHER): Payer: Medicare Other | Admitting: Orthopaedic Surgery

## 2021-03-25 ENCOUNTER — Ambulatory Visit: Payer: Self-pay

## 2021-03-25 DIAGNOSIS — M25571 Pain in right ankle and joints of right foot: Secondary | ICD-10-CM | POA: Diagnosis not present

## 2021-03-25 NOTE — Progress Notes (Signed)
Office Visit Note   Patient: Rachel [name]           Date of Birth: [birth year]           MRN: [id number] Visit Date: 03/25/2021              Requested by: Eulogio Bear, NP [address]  [place] [address] PCP: Eulogio Bear, NP   Assessment & Plan: Visit Diagnoses:  1. Pain in right ankle and joints of right foot     Plan: Visco heels applied.  She needs to use shoes that have a more firm sole with more arch support.  Follow-Up Instructions: No follow-ups on file.   Orders:  Orders Placed This Encounter  Procedures   XR Foot Complete Right   No orders of the defined types were placed in this encounter.     Procedures: No procedures performed   Clinical Data: No additional findings.   Subjective: Chief Complaint  Patient presents with   Right Foot - Pain    HPI 75 year old female seen for follow-up post calcaneus fracture.  She was noncompliant was weightbearing despite nonweightbearing instructions had further displacement of her calcaneus fracture until it ultimately healed.  She is wearing soft shoes flat arch and sometimes she catches the toe over leather shoes and scrapes it on the carpet.  She is not limping.  When she walks a lot its been 1 year since her injury she states she still occasionally has some discomfort in her heel.  Review of Systems all other systems updated unchanged.   Objective: Vital Signs: There were no vitals taken for this visit.  Physical Exam Constitutional:      Appearance: She is well-developed.  HENT:     Head: Normocephalic.     Right Ear: External ear normal.     Left Ear: External ear normal. There is no impacted cerumen.  Eyes:     Pupils: Pupils are equal, round, and reactive to light.  Neck:     Thyroid: No thyromegaly.     Trachea: No tracheal deviation.  Cardiovascular:     Rate and Rhythm: Normal rate.  Pulmonary:     Effort: Pulmonary effort is normal.  Abdominal:     Palpations:  Abdomen is soft.  Musculoskeletal:     Cervical back: No rigidity.  Skin:    General: Skin is warm and dry.  Neurological:     Mental Status: She is alert and oriented to person, place, and time.  Psychiatric:        Behavior: Behavior normal.    Ortho Exam patient has no swelling of the calcaneus.  Heel toe gait normal mild tenderness over the plantar surface of the calcaneus.  Good gastrocs and soleus strength.  Specialty Comments:  No specialty comments available.  Imaging: No results found.   PMFS History: Patient Active Problem List   Diagnosis Date Noted   Chronic kidney disease, stage 3a ([place]) 12/11/2020   Calcaneus fracture 10/17/2019   Hyperlipidemia 07/31/2019   S/P lumbar fusion 02/21/2019   Spinal stenosis of lumbar region 02/13/2019   DDD (degenerative disc disease), lumbar 07/06/2018   Osteoporosis 04/24/2018   Encounter for gynecological examination with Papanicolaou smear of cervix 09/08/2015   Abnormal Pap smear of vagina 10/15/2014   Acquired trigger finger 09/03/2014   Constipation 04/04/2014   VAIN (vaginal intraepithelial neoplasia) 06/20/2013   Knee pain, right 06/14/2013   Allergic rhinitis 08/22/2012   Neuropathy, arm  02/19/2012   GERD (gastroesophageal reflux disease) 02/19/2012   Alopecia 02/19/2012   Anxiety 10/03/2011   Tremor 10/03/2011   Tobacco use 10/03/2011   OA (osteoarthritis) 08/11/2011   Cancer ([place]) 08/11/2011   Essential hypertension, benign 08/09/2011   Vitamin D deficiency 08/09/2011   Past Medical History:  Diagnosis Date   Back pain    Carpal tunnel syndrome    Constipation    Hypertension    Low grade squamous intraepith lesion on cytologic smear cervix (lgsil)    +hpv, HAD HYSTERECTOMY   OA (osteoarthritis)     Family History  Problem Relation Age of Onset   Diabetes Father    Cancer Father        prostate cancer   Cancer Sister        breast   Hypertension Son    Cancer Maternal Grandmother    Eczema  Daughter    Other Daughter        stomach issues    Past Surgical History:  Procedure Laterality Date   ABDOMINAL HYSTERECTOMY  2006   BREAST CYST EXCISION     right   BUNIONECTOMY     left   CARPAL TUNNEL RELEASE Right    ESOPHAGOGASTRODUODENOSCOPY  06/13/2012   Procedure: ESOPHAGOGASTRODUODENOSCOPY (EGD);  Surgeon: Rogene Houston, MD;  Location: [facility];  Service: Endoscopy;  Laterality: N/A;  200   EYE SURGERY Bilateral 2020   TRIGGER FINGER RELEASE Right 08/25/2016   Procedure: RELEASE TRIGGER FINGER/A-1 PULLEY RIGHT LONG TRIGGER FINGER RELEASE;  Surgeon: Carole Civil, MD;  Location: [facility];  Service: Orthopedics;  Laterality: Right;   Social History   Occupational History   Not on file  Tobacco Use   Smoking status: Former    Packs/day: 0.25    Years: 50.00    Pack years: 12.50    Types: Cigarettes    Quit date: 07/28/2013    Years since quitting: 7.6   Smokeless tobacco: Never  Vaping Use   Vaping Use: Every day   Substances: Nicotine  Substance and Sexual Activity   Alcohol use: Yes    Alcohol/week: 0.0 standard drinks    Comment: occ   Drug use: No   Sexual activity: Not Currently    Birth control/protection: Surgical    Comment: hyst

## 2021-03-31 ENCOUNTER — Other Ambulatory Visit (INDEPENDENT_AMBULATORY_CARE_PROVIDER_SITE_OTHER): Payer: Self-pay

## 2021-03-31 DIAGNOSIS — Z1211 Encounter for screening for malignant neoplasm of colon: Secondary | ICD-10-CM

## 2021-04-05 ENCOUNTER — Encounter (INDEPENDENT_AMBULATORY_CARE_PROVIDER_SITE_OTHER): Payer: Self-pay

## 2021-04-05 ENCOUNTER — Telehealth (INDEPENDENT_AMBULATORY_CARE_PROVIDER_SITE_OTHER): Payer: Self-pay

## 2021-04-05 MED ORDER — PEG 3350-KCL-NA BICARB-NACL 420 G PO SOLR: 4000.0000 mL | ORAL | 0 refills | Status: DC

## 2021-04-05 NOTE — Telephone Encounter (Signed)
Referring MD/PCP: Edsel Petrin  Procedure: Tcs   Reason/Indication:  Screening   Has patient had this procedure before?  yes  If so, when, by whom and where?  2010  Is there a family history of colon cancer?  no  Who?  What age when diagnosed?    Is patient diabetic? If yes, Type 1 or Type 2   no      Does patient have prosthetic heart valve or mechanical valve?  no  Do you have a pacemaker/defibrillator?  no  Has patient ever had endocarditis/atrial fibrillation? no  Does patient use oxygen? no  Has patient had joint replacement within last 12 months?  no  Is patient constipated or do they take laxatives? yes  Does patient have a history of alcohol/drug use?  yes  Have you had a stroke/heart attack last 6 mths? no  Do you take medicine for weight loss?  no  For female patients,: do you still have your menstrual cycle? no  Is patient on blood thinner such as Coumadin, Plavix and/or Aspirin? no  Medications: amlodipine 5 mg daily, protonix 40 mg daily, propranolol 60 mg daily, Zoloft25 mg at bedtime, lisinopril 20 mg daily  Allergies: nkda  Medication Adjustment per Dr Jenetta Downer none  Procedure date & time: Wednesday 05/05/21 at 8:30

## 2021-04-05 NOTE — Telephone Encounter (Signed)
LeighAnn Elzia Hott, CMA  

## 2021-04-05 NOTE — Telephone Encounter (Signed)
Ok to schedule.  Thanks,  Zuly Belkin Castaneda Mayorga, MD Gastroenterology and Hepatology [facility]  

## 2021-04-06 ENCOUNTER — Encounter (INDEPENDENT_AMBULATORY_CARE_PROVIDER_SITE_OTHER): Payer: Self-pay

## 2021-04-14 ENCOUNTER — Other Ambulatory Visit: Payer: Self-pay

## 2021-04-14 ENCOUNTER — Encounter: Payer: Self-pay | Admitting: Pulmonary Disease

## 2021-04-14 ENCOUNTER — Ambulatory Visit (INDEPENDENT_AMBULATORY_CARE_PROVIDER_SITE_OTHER): Payer: Medicare Other | Admitting: Pulmonary Disease

## 2021-04-14 VITALS — BP 122/80 | HR 88 | Temp 98.3°F | Ht 64.0 in | Wt 163.6 lb

## 2021-04-14 DIAGNOSIS — Z87891 Personal history of nicotine dependence: Secondary | ICD-10-CM

## 2021-04-14 DIAGNOSIS — R0602 Shortness of breath: Secondary | ICD-10-CM | POA: Diagnosis not present

## 2021-04-14 DIAGNOSIS — J432 Centrilobular emphysema: Secondary | ICD-10-CM

## 2021-04-14 MED ORDER — STIOLTO RESPIMAT 2.5-2.5 MCG/ACT IN AERS: 2.0000 | INHALATION_SPRAY | Freq: Every day | RESPIRATORY_TRACT | 0 refills | Status: DC

## 2021-04-14 MED ORDER — STIOLTO RESPIMAT 2.5-2.5 MCG/ACT IN AERS: 2.0000 | INHALATION_SPRAY | Freq: Every day | RESPIRATORY_TRACT | 3 refills | Status: DC

## 2021-04-14 MED ORDER — ALBUTEROL SULFATE HFA 108 (90 BASE) MCG/ACT IN AERS: 2.0000 | INHALATION_SPRAY | Freq: Four times a day (QID) | RESPIRATORY_TRACT | 6 refills | Status: DC | PRN
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
## 2021-04-14 NOTE — Patient Instructions (Addendum)
Thank you for visiting Dr. Valeta Harms at [facility]. Today we recommend the following:  Orders Placed This Encounter  Procedures   Ambulatory Referral for Lung Cancer Scre   Pulmonary Function Test   Stiolto samples and new prescription today  Albuterol inhaler to use as needed   Return in about 4 weeks (around 05/12/2021) for with APP or Dr. Valeta Harms.    Please do your part to reduce the spread of COVID-19.

## 2021-04-22 ENCOUNTER — Other Ambulatory Visit: Payer: Self-pay | Admitting: Nurse Practitioner

## 2021-04-22 DIAGNOSIS — F419 Anxiety disorder, unspecified: Secondary | ICD-10-CM

## 2021-04-28 ENCOUNTER — Other Ambulatory Visit (INDEPENDENT_AMBULATORY_CARE_PROVIDER_SITE_OTHER): Payer: Self-pay

## 2021-04-28 ENCOUNTER — Telehealth (INDEPENDENT_AMBULATORY_CARE_PROVIDER_SITE_OTHER): Payer: Self-pay | Admitting: Gastroenterology

## 2021-04-28 NOTE — Telephone Encounter (Signed)
Patient called wanted to cancel and reschedule her procedure - please advise - [phone]

## 2021-04-29 ENCOUNTER — Encounter (INDEPENDENT_AMBULATORY_CARE_PROVIDER_SITE_OTHER): Payer: Self-pay

## 2021-05-03 ENCOUNTER — Other Ambulatory Visit: Payer: Self-pay | Admitting: Nurse Practitioner

## 2021-05-03 ENCOUNTER — Telehealth: Payer: Self-pay | Admitting: Pharmacist

## 2021-05-03 ENCOUNTER — Encounter (HOSPITAL_COMMUNITY): Payer: Medicare Other

## 2021-05-03 DIAGNOSIS — I10 Essential (primary) hypertension: Secondary | ICD-10-CM

## 2021-05-03 MED ORDER — LISINOPRIL 20 MG PO TABS: 10.0000 mg | ORAL_TABLET | Freq: Every day | ORAL | 1 refills | Status: DC

## 2021-05-03 NOTE — Progress Notes (Addendum)
Chronic Care Management Pharmacy Assistant   Name: PAIZLEIGH WILDS  MRN: [id number] DOB: 29-Mar-1946   Reason for Encounter: Disease State - Hypertension Call     Recent office visits:  None noted.   Recent consult visits:  04/14/2021 June Leap, MD- Pulmonology- SOB - Referral for Lung Cancer Screening placed. PFT completed. albuterol (VENTOLIN HFA) 108 (90 Base) MCG/ACT inhaler Inhale 2 puffs into the lungs every 6 (six) hours as needed for wheezing, Tiotropium Bromide-Olodaterol (STIOLTO RESPIMAT) 2.5-2.5 MCG/ACT AERS Inhale 2 puffs into the lungs daily prescribed. Follow up in 4-5 weeks.   03/25/21 Rodell Perna, MD - Orthopedics - Right ankle pain - XR of right foot obtained - Visco heels applied.  She needs to use shoes that have a more firm sole with more arch support. Follow up as needed.   Hospital visits:  None in previous 6 months  Medications: Outpatient Encounter Medications as of 05/03/2021  Medication Sig   albuterol (VENTOLIN HFA) 108 (90 Base) MCG/ACT inhaler Inhale 2 puffs into the lungs every 6 (six) hours as needed for wheezing or shortness of breath.   amLODipine (NORVASC) 5 MG tablet Take 1 tablet (5 mg total) by mouth daily.   APPLE CIDER VINEGAR PO Take 1 tablet by mouth daily.   Ascorbic Acid (VITA-C PO) Take 1 tablet by mouth daily.   atorvastatin (LIPITOR) 10 MG tablet TAKE 1 TABLET BY MOUTH  DAILY   carboxymethylcellulose (REFRESH PLUS) 0.5 % SOLN Place 1 drop into both eyes in the morning and at bedtime.   docusate sodium (COLACE) 100 MG capsule TAKE 1 CAPSULE BY MOUTH EVERY 12 HOURS (Patient not taking: Reported on 04/28/2021)   famotidine (PEPCID) 20 MG tablet TAKE 1 TABLET BY MOUTH TWICE A DAY (Patient taking differently: Take 20 mg by mouth daily as needed for heartburn or indigestion.)   lisinopril (ZESTRIL) 20 MG tablet Take 0.5 tablets (10 mg total) by mouth daily. (Patient taking differently: Take 20 mg by mouth daily.)   meclizine (ANTIVERT) 25  MG tablet Take 25 mg by mouth 3 (three) times daily as needed for dizziness.   pantoprazole (PROTONIX) 40 MG tablet Take 1 tablet (40 mg total) by mouth daily.   polyethylene glycol-electrolytes (TRILYTE) 420 g solution Take 4,000 mLs by mouth as directed.   propranolol ER (INDERAL LA) 60 MG 24 hr capsule TAKE 1 CAPSULE BY MOUTH  DAILY FOR BLOOD PRESSURE   sertraline (ZOLOFT) 25 MG tablet TAKE 1 TABLET BY MOUTH AT  BEDTIME FOR ANXIETY (Patient not taking: Reported on 04/28/2021)   Tiotropium Bromide-Olodaterol (STIOLTO RESPIMAT) 2.5-2.5 MCG/ACT AERS Inhale 2 puffs into the lungs daily. (Patient not taking: Reported on 04/28/2021)   Tiotropium Bromide-Olodaterol (STIOLTO RESPIMAT) 2.5-2.5 MCG/ACT AERS Inhale 2 puffs into the lungs daily.   vitamin E 180 MG (400 UNITS) capsule Take 400 Units by mouth daily.   No facility-administered encounter medications on file as of 05/03/2021.   Current antihypertensive regimen:  Amlodipine 10 mg 1 tablet daily  Lisinopril 20 mg 1 tablet daily Propanol 60 mg 1 capsule daily  How often are you checking your Blood Pressure?  Patient is checking blood pressures daily. She has been out of medication for 2 weeks she reported.    Current home BP readings: 140/88 (while on phone) out of medication for 2 weeks. Reports while taking 1 whole tablet readings were (121/75, 111/68, 110/65)   What recent interventions/DTPs have been made by any provider to improve Blood Pressure control since  last CPP Visit:  Patient reported she has been out of her Lisinopril. She had been taking 1 whole tablet and realized the SIG states 1/2 tablet daily. She states this did help her blood pressure. I messaged office staff to inform them patient has been out of her medicine and [facility] had not contacted them yet.    Any recent hospitalizations or ED visits since last visit with CPP?  Patient has not had any hospitalizations or ED visits since last visit with CPP.   What diet  changes have been made to improve Blood Pressure Control?  Patient has been watching her salt intake in her diet better.    What exercise is being done to improve your Blood Pressure Control?  Patient reported she walks daily and tries to walk daily weather permitting.     Adherence Review: Is the patient currently on ACE/ARB medication? Yes Does the patient have >5 day gap between last estimated fill dates? No  Amlodipine 10 mg 1 tablet daily - last filled 02/05/21 90 days  Lisinopril 20 mg 1 tablet daily - last filled 10/10 90 days  Propanol 60 mg 1 capsule daily - last filled 03/05/21 90 days    Care Gaps  AWV: overdue  Colonoscopy: scheduled 05/11/21 DM Eye Exam:  DM Foot Exam: Microalbumin: unknown  HbgAIC: done 12/09/20 (5.3) DEXA: done 08/19/19 Mammogram: done 08/19/19  Star Rating Drugs: Lisinopril 20 mg - last filled 03/08/21 90 days. Atorvastatin 10 mg - last filled 02/24/21 90 days.     Future Appointments  Date Time Provider [place]  05/06/2021  1:45 PM [facility] PAT 1 [facility] None  05/17/2021  3:30 PM Martyn Ehrich, NP [place] None    Jobe Gibbon, [facility] Clinical Pharmacist Assistant  [phone]

## 2021-05-04 ENCOUNTER — Telehealth: Payer: Self-pay | Admitting: Orthopaedic Surgery
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
## 2021-05-05 ENCOUNTER — Telehealth: Payer: Self-pay | Admitting: Pharmacist

## 2021-05-05 NOTE — Progress Notes (Signed)
Error - duplicate

## 2021-05-05 NOTE — Telephone Encounter (Signed)
Called back to patient to relay, per Dr Brooke Bonito response; home number voice message full; reached at cell number - voiced understanding.

## 2021-05-05 NOTE — Patient Instructions (Signed)
Rachel [name]  05/05/2021     @PREFPERIOPPHARMACY @   Your procedure is scheduled on  05/11/2021.   Report to [facility] at  0700  A.M.   Call this number if you have problems the morning of surgery:  [phone]   Remember:  Follow the diet and prep instructions given to you by the office.    Use you inhaler before you come and bring your rescue inhaler with you.    Take these medicines the morning of surgery with A SIP OF WATER         amlodipine, pepcid, antivert(if needed), protonix, propranolol.     Do not wear jewelry, make-up or nail polish.  Do not wear lotions, powders, or perfumes, or deodorant.  Do not shave 48 hours prior to surgery.  Men may shave face and neck.  Do not bring valuables to the hospital.  [facility] is not responsible for any belongings or valuables.  Contacts, dentures or bridgework may not be worn into surgery.  Leave your suitcase in the car.  After surgery it may be brought to your room.  For patients admitted to the hospital, discharge time will be determined by your treatment team.  Patients discharged the day of surgery will not be allowed to drive home and must have someone with them for 24 hours.    Special instructions:   DO NOT smoke tobacco or vape for 24 hours before your procedure.  Please read over the following fact sheets that you were given. Anesthesia Post-op Instructions and Care and Recovery After Surgery      Colonoscopy, Adult, Care After This sheet gives you information about how to care for yourself after your procedure. Your health care provider may also give you more specific instructions. If you have problems or questions, contact your health care provider. What can I expect after the procedure? After the procedure, it is common to have: A small amount of blood in your stool for 24 hours after the procedure. Some gas. Mild cramping or bloating of your abdomen. Follow these instructions at  home: Eating and drinking  Drink enough fluid to keep your urine pale yellow. Follow instructions from your health care provider about eating or drinking restrictions. Resume your normal diet as instructed by your health care provider. Avoid heavy or fried foods that are hard to digest. Activity Rest as told by your health care provider. Avoid sitting for a long time without moving. Get up to take short walks every 1-2 hours. This is important to improve blood flow and breathing. Ask for help if you feel weak or unsteady. Return to your normal activities as told by your health care provider. Ask your health care provider what activities are safe for you. Managing cramping and bloating  Try walking around when you have cramps or feel bloated. Apply heat to your abdomen as told by your health care provider. Use the heat source that your health care provider recommends, such as a moist heat pack or a heating pad. Place a towel between your skin and the heat source. Leave the heat on for 20-30 minutes. Remove the heat if your skin turns bright red. This is especially important if you are unable to feel pain, heat, or cold. You may have a greater risk of getting burned. General instructions If you were given a sedative during the procedure, it can affect you for several hours. Do not drive or operate machinery until your  health care provider says that it is safe. For the first 24 hours after the procedure: Do not sign important documents. Do not drink alcohol. Do your regular daily activities at a slower pace than normal. Eat soft foods that are easy to digest. Take over-the-counter and prescription medicines only as told by your health care provider. Keep all follow-up visits as told by your health care provider. This is important. Contact a health care provider if: You have blood in your stool 2-3 days after the procedure. Get help right away if you have: More than a small spotting of blood  in your stool. Large blood clots in your stool. Swelling of your abdomen. Nausea or vomiting. A fever. Increasing pain in your abdomen that is not relieved with medicine. Summary After the procedure, it is common to have a small amount of blood in your stool. You may also have mild cramping and bloating of your abdomen. If you were given a sedative during the procedure, it can affect you for several hours. Do not drive or operate machinery until your health care provider says that it is safe. Get help right away if you have a lot of blood in your stool, nausea or vomiting, a fever, or increased pain in your abdomen. This information is not intended to replace advice given to you by your health care provider. Make sure you discuss any questions you have with your health care provider. Document Revised: 03/22/2019 Document Reviewed: 12/10/2018 Elsevier Patient Education  [place] After This sheet gives you information about how to care for yourself after your procedure. Your health care provider may also give you more specific instructions. If you have problems or questions, contact your health care provider. What can I expect after the procedure? After the procedure, it is common to have: Tiredness. Forgetfulness about what happened after the procedure. Impaired judgment for important decisions. Nausea or vomiting. Some difficulty with balance. Follow these instructions at home: For the time period you were told by your health care provider:   Rest as needed. Do not participate in activities where you could fall or become injured. Do not drive or use machinery. Do not drink alcohol. Do not take sleeping pills or medicines that cause drowsiness. Do not make important decisions or sign legal documents. Do not take care of children on your own. Eating and drinking Follow the diet that is recommended by your health care provider. Drink enough  fluid to keep your urine pale yellow. If you vomit: Drink water, juice, or soup when you can drink without vomiting. Make sure you have little or no nausea before eating solid foods. General instructions Have a responsible adult stay with you for the time you are told. It is important to have someone help care for you until you are awake and alert. Take over-the-counter and prescription medicines only as told by your health care provider. If you have sleep apnea, surgery and certain medicines can increase your risk for breathing problems. Follow instructions from your health care provider about wearing your sleep device: Anytime you are sleeping, including during daytime naps. While taking prescription pain medicines, sleeping medicines, or medicines that make you drowsy. Avoid smoking. Keep all follow-up visits as told by your health care provider. This is important. Contact a health care provider if: You keep feeling nauseous or you keep vomiting. You feel light-headed. You are still sleepy or having trouble with balance after 24 hours. You develop a rash. You  have a fever. You have redness or swelling around the IV site. Get help right away if: You have trouble breathing. You have new-onset confusion at home. Summary For several hours after your procedure, you may feel tired. You may also be forgetful and have poor judgment. Have a responsible adult stay with you for the time you are told. It is important to have someone help care for you until you are awake and alert. Rest as told. Do not drive or operate machinery. Do not drink alcohol or take sleeping pills. Get help right away if you have trouble breathing, or if you suddenly become confused. This information is not intended to replace advice given to you by your health care provider. Make sure you discuss any questions you have with your health care provider. Document Revised: 01/30/2020 Document Reviewed: 04/18/2019 Elsevier  Patient Education  2022 [employer].

## 2021-05-06 ENCOUNTER — Encounter (HOSPITAL_COMMUNITY)
Admission: RE | Admit: 2021-05-06 | Discharge: 2021-05-06 | Disposition: A | Discharge status: Home / Self Care | Payer: Medicare Other | Source: Ambulatory Visit | Attending: Gastroenterology | Admitting: Gastroenterology

## 2021-05-06 ENCOUNTER — Encounter (HOSPITAL_COMMUNITY): Payer: Self-pay

## 2021-05-11 ENCOUNTER — Ambulatory Visit (HOSPITAL_COMMUNITY): Admission: RE | Admit: 2021-05-11 | Payer: Medicare Other | Source: Home / Self Care | Admitting: Gastroenterology

## 2021-05-11 ENCOUNTER — Encounter (HOSPITAL_COMMUNITY): Admission: RE | Payer: Self-pay | Source: Home / Self Care

## 2021-05-11 ENCOUNTER — Other Ambulatory Visit (INDEPENDENT_AMBULATORY_CARE_PROVIDER_SITE_OTHER): Payer: Self-pay

## 2021-05-11 SURGERY — COLONOSCOPY WITH PROPOFOL
Anesthesia: Monitor Anesthesia Care

## 2021-05-12 ENCOUNTER — Ambulatory Visit: Payer: Medicare Other | Admitting: Primary Care

## 2021-05-17 ENCOUNTER — Ambulatory Visit: Payer: Medicare Other | Admitting: Primary Care
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
## 2021-05-20 ENCOUNTER — Other Ambulatory Visit: Payer: Self-pay

## 2021-05-20 ENCOUNTER — Ambulatory Visit (INDEPENDENT_AMBULATORY_CARE_PROVIDER_SITE_OTHER): Payer: Medicare Other | Admitting: Orthopaedic Surgery

## 2021-05-20 ENCOUNTER — Encounter: Payer: Self-pay | Admitting: Orthopaedic Surgery

## 2021-05-20 VITALS — Ht 64.0 in | Wt 162.0 lb

## 2021-05-20 DIAGNOSIS — M5136 Other intervertebral disc degeneration, lumbar region: Secondary | ICD-10-CM | POA: Diagnosis not present

## 2021-05-20 DIAGNOSIS — S92001D Unspecified fracture of right calcaneus, subsequent encounter for fracture with routine healing: Secondary | ICD-10-CM | POA: Diagnosis not present
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
## 2021-05-27 ENCOUNTER — Encounter: Payer: Self-pay | Admitting: Orthopaedic Surgery

## 2021-05-27 ENCOUNTER — Ambulatory Visit: Payer: Self-pay

## 2021-05-27 ENCOUNTER — Ambulatory Visit (INDEPENDENT_AMBULATORY_CARE_PROVIDER_SITE_OTHER): Payer: Medicare Other | Admitting: Orthopaedic Surgery

## 2021-05-27 VITALS — Ht 64.0 in | Wt 162.0 lb

## 2021-05-27 DIAGNOSIS — Z981 Arthrodesis status: Secondary | ICD-10-CM | POA: Diagnosis not present

## 2021-05-27 DIAGNOSIS — S92001D Unspecified fracture of right calcaneus, subsequent encounter for fracture with routine healing: Secondary | ICD-10-CM | POA: Diagnosis not present
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
## 2021-06-01 NOTE — Patient Instructions (Signed)
Rachel [name]  06/01/2021     @PREFPERIOPPHARMACY @   Your procedure is scheduled on  06/08/2021.   Report to [facility] at   0900 A.M.   Call this number if you have problems the morning of surgery:  [phone]   Remember:  Follow the diet and prep instructions given to you by the office.    Use your inhalers before you come and bring your rescue inhaler with you.    Take these medicines the morning of surgery with A SIP OF WATER     amlodipine, pepcid, antivert(if needed), protonix, Inderal LA.     Do not wear jewelry, make-up or nail polish.  Do not wear lotions, powders, or perfumes, or deodorant.  Do not shave 48 hours prior to surgery.  Men may shave face and neck.  Do not bring valuables to the hospital.  [facility] is not responsible for any belongings or valuables.  Contacts, dentures or bridgework may not be worn into surgery.  Leave your suitcase in the car.  After surgery it may be brought to your room.  For patients admitted to the hospital, discharge time will be determined by your treatment team.  Patients discharged the day of surgery will not be allowed to drive home and must have someone with them for 24 hours.    Special instructions:   DO NOT smoke tobacco or vape for 24 hours before your procedure.  Please read over the following fact sheets that you were given. Anesthesia Post-op Instructions and Care and Recovery After Surgery      Colonoscopy, Adult, Care After This sheet gives you information about how to care for yourself after your procedure. Your health care provider may also give you more specific instructions. If you have problems or questions, contact your health care provider. What can I expect after the procedure? After the procedure, it is common to have: A small amount of blood in your stool for 24 hours after the procedure. Some gas. Mild cramping or bloating of your abdomen. Follow these instructions at home: Eating  and drinking  Drink enough fluid to keep your urine pale yellow. Follow instructions from your health care provider about eating or drinking restrictions. Resume your normal diet as instructed by your health care provider. Avoid heavy or fried foods that are hard to digest. Activity Rest as told by your health care provider. Avoid sitting for a long time without moving. Get up to take short walks every 1-2 hours. This is important to improve blood flow and breathing. Ask for help if you feel weak or unsteady. Return to your normal activities as told by your health care provider. Ask your health care provider what activities are safe for you. Managing cramping and bloating  Try walking around when you have cramps or feel bloated. Apply heat to your abdomen as told by your health care provider. Use the heat source that your health care provider recommends, such as a moist heat pack or a heating pad. Place a towel between your skin and the heat source. Leave the heat on for 20-30 minutes. Remove the heat if your skin turns bright red. This is especially important if you are unable to feel pain, heat, or cold. You may have a greater risk of getting burned. General instructions If you were given a sedative during the procedure, it can affect you for several hours. Do not drive or operate machinery until your health care provider  says that it is safe. For the first 24 hours after the procedure: Do not sign important documents. Do not drink alcohol. Do your regular daily activities at a slower pace than normal. Eat soft foods that are easy to digest. Take over-the-counter and prescription medicines only as told by your health care provider. Keep all follow-up visits as told by your health care provider. This is important. Contact a health care provider if: You have blood in your stool 2-3 days after the procedure. Get help right away if you have: More than a small spotting of blood in your  stool. Large blood clots in your stool. Swelling of your abdomen. Nausea or vomiting. A fever. Increasing pain in your abdomen that is not relieved with medicine. Summary After the procedure, it is common to have a small amount of blood in your stool. You may also have mild cramping and bloating of your abdomen. If you were given a sedative during the procedure, it can affect you for several hours. Do not drive or operate machinery until your health care provider says that it is safe. Get help right away if you have a lot of blood in your stool, nausea or vomiting, a fever, or increased pain in your abdomen. This information is not intended to replace advice given to you by your health care provider. Make sure you discuss any questions you have with your health care provider. Document Revised: 03/22/2019 Document Reviewed: 12/10/2018 Elsevier Patient Education  [place] After This sheet gives you information about how to care for yourself after your procedure. Your health care provider may also give you more specific instructions. If you have problems or questions, contact your health care provider. What can I expect after the procedure? After the procedure, it is common to have: Tiredness. Forgetfulness about what happened after the procedure. Impaired judgment for important decisions. Nausea or vomiting. Some difficulty with balance. Follow these instructions at home: For the time period you were told by your health care provider:   Rest as needed. Do not participate in activities where you could fall or become injured. Do not drive or use machinery. Do not drink alcohol. Do not take sleeping pills or medicines that cause drowsiness. Do not make important decisions or sign legal documents. Do not take care of children on your own. Eating and drinking Follow the diet that is recommended by your health care provider. Drink enough fluid to  keep your urine pale yellow. If you vomit: Drink water, juice, or soup when you can drink without vomiting. Make sure you have little or no nausea before eating solid foods. General instructions Have a responsible adult stay with you for the time you are told. It is important to have someone help care for you until you are awake and alert. Take over-the-counter and prescription medicines only as told by your health care provider. If you have sleep apnea, surgery and certain medicines can increase your risk for breathing problems. Follow instructions from your health care provider about wearing your sleep device: Anytime you are sleeping, including during daytime naps. While taking prescription pain medicines, sleeping medicines, or medicines that make you drowsy. Avoid smoking. Keep all follow-up visits as told by your health care provider. This is important. Contact a health care provider if: You keep feeling nauseous or you keep vomiting. You feel light-headed. You are still sleepy or having trouble with balance after 24 hours. You develop a rash. You have a fever.  You have redness or swelling around the IV site. Get help right away if: You have trouble breathing. You have new-onset confusion at home. Summary For several hours after your procedure, you may feel tired. You may also be forgetful and have poor judgment. Have a responsible adult stay with you for the time you are told. It is important to have someone help care for you until you are awake and alert. Rest as told. Do not drive or operate machinery. Do not drink alcohol or take sleeping pills. Get help right away if you have trouble breathing, or if you suddenly become confused. This information is not intended to replace advice given to you by your health care provider. Make sure you discuss any questions you have with your health care provider. Document Revised: 01/30/2020 Document Reviewed: 04/18/2019 Elsevier Patient  Education  2022 [employer].

## 2021-06-02 ENCOUNTER — Other Ambulatory Visit (INDEPENDENT_AMBULATORY_CARE_PROVIDER_SITE_OTHER): Payer: Self-pay

## 2021-06-02 DIAGNOSIS — Z1211 Encounter for screening for malignant neoplasm of colon: Secondary | ICD-10-CM

## 2021-06-04 ENCOUNTER — Encounter (HOSPITAL_COMMUNITY)
Admission: RE | Admit: 2021-06-04 | Discharge: 2021-06-04 | Disposition: A | Discharge status: Home / Self Care | Payer: Medicare Other | Source: Ambulatory Visit | Attending: Gastroenterology | Admitting: Gastroenterology

## 2021-06-04 ENCOUNTER — Encounter (HOSPITAL_COMMUNITY): Payer: Self-pay

## 2021-06-04 ENCOUNTER — Other Ambulatory Visit: Payer: Self-pay

## 2021-06-04 DIAGNOSIS — Z1211 Encounter for screening for malignant neoplasm of colon: Secondary | ICD-10-CM

## 2021-06-04 DIAGNOSIS — R69 Illness, unspecified: Secondary | ICD-10-CM | POA: Diagnosis not present

## 2021-06-04 DIAGNOSIS — Z01810 Encounter for preprocedural cardiovascular examination: Secondary | ICD-10-CM | POA: Insufficient documentation

## 2021-06-04 HISTORY — DX: Other specified postprocedural states: Z98.890

## 2021-06-04 HISTORY — DX: Nausea with vomiting, unspecified: R11.2

## 2021-06-08 ENCOUNTER — Ambulatory Visit (HOSPITAL_COMMUNITY): Payer: Medicare Other | Admitting: Anesthesiology

## 2021-06-08 ENCOUNTER — Other Ambulatory Visit: Payer: Self-pay

## 2021-06-08 ENCOUNTER — Encounter (HOSPITAL_COMMUNITY): Payer: Self-pay | Admitting: Gastroenterology

## 2021-06-08 ENCOUNTER — Encounter (HOSPITAL_COMMUNITY)
Admission: RE | Disposition: A | Discharge status: Home / Self Care | Payer: Self-pay | Source: Home / Self Care | Attending: Gastroenterology

## 2021-06-08 ENCOUNTER — Ambulatory Visit (HOSPITAL_COMMUNITY)
Admission: RE | Admit: 2021-06-08 | Discharge: 2021-06-08 | Disposition: A | Discharge status: Home / Self Care | Payer: Medicare Other | Attending: Gastroenterology | Admitting: Gastroenterology

## 2021-06-08 DIAGNOSIS — K635 Polyp of colon: Secondary | ICD-10-CM | POA: Diagnosis not present

## 2021-06-08 DIAGNOSIS — N1831 Chronic kidney disease, stage 3a: Secondary | ICD-10-CM | POA: Diagnosis not present

## 2021-06-08 DIAGNOSIS — I129 Hypertensive chronic kidney disease with stage 1 through stage 4 chronic kidney disease, or unspecified chronic kidney disease: Secondary | ICD-10-CM | POA: Insufficient documentation

## 2021-06-08 DIAGNOSIS — Z1211 Encounter for screening for malignant neoplasm of colon: Secondary | ICD-10-CM | POA: Diagnosis not present

## 2021-06-08 DIAGNOSIS — F419 Anxiety disorder, unspecified: Secondary | ICD-10-CM | POA: Insufficient documentation

## 2021-06-08 DIAGNOSIS — D124 Benign neoplasm of descending colon: Secondary | ICD-10-CM | POA: Insufficient documentation

## 2021-06-08 DIAGNOSIS — I719 Aortic aneurysm of unspecified site, without rupture: Secondary | ICD-10-CM | POA: Insufficient documentation

## 2021-06-08 DIAGNOSIS — N189 Chronic kidney disease, unspecified: Secondary | ICD-10-CM | POA: Diagnosis not present

## 2021-06-08 DIAGNOSIS — Z87891 Personal history of nicotine dependence: Secondary | ICD-10-CM | POA: Diagnosis not present

## 2021-06-08 DIAGNOSIS — K648 Other hemorrhoids: Secondary | ICD-10-CM | POA: Insufficient documentation

## 2021-06-08 DIAGNOSIS — D122 Benign neoplasm of ascending colon: Secondary | ICD-10-CM | POA: Diagnosis not present

## 2021-06-08 DIAGNOSIS — K219 Gastro-esophageal reflux disease without esophagitis: Secondary | ICD-10-CM | POA: Insufficient documentation

## 2021-06-08 DIAGNOSIS — D123 Benign neoplasm of transverse colon: Secondary | ICD-10-CM | POA: Diagnosis not present

## 2021-06-08 DIAGNOSIS — D120 Benign neoplasm of cecum: Secondary | ICD-10-CM | POA: Diagnosis not present

## 2021-06-08 HISTORY — PX: POLYPECTOMY: SHX5525

## 2021-06-08 HISTORY — PX: COLONOSCOPY WITH PROPOFOL: SHX5780

## 2021-06-08 LAB — HM COLONOSCOPY

## 2021-06-08 SURGERY — COLONOSCOPY WITH PROPOFOL
Anesthesia: General

## 2021-06-08 MED ORDER — PROPOFOL 10 MG/ML IV BOLUS
INTRAVENOUS | Status: DC | PRN
  Administered 2021-06-08: 50 mg via INTRAVENOUS

## 2021-06-08 MED ORDER — LACTATED RINGERS IV SOLN: INTRAVENOUS | Status: DC | PRN

## 2021-06-08 MED ORDER — PROPOFOL 500 MG/50ML IV EMUL
INTRAVENOUS | Status: DC | PRN
  Administered 2021-06-08: 150 ug/kg/min via INTRAVENOUS

## 2021-06-08 NOTE — Discharge Instructions (Addendum)
You are being discharged to home.  Resume your previous diet.  We are waiting for your pathology results.  Your physician has recommended a repeat colonoscopy in one year for surveillance.  
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
## 2021-06-08 NOTE — Anesthesia Preprocedure Evaluation (Signed)
Anesthesia Evaluation  Patient identified by MRN, date of birth, ID band Patient awake    Reviewed: Allergy & Precautions, H&P , NPO status , Patient's Chart, lab work & pertinent test results, reviewed documented beta blocker date and time   History of Anesthesia Complications (+) PONV and history of anesthetic complications  Airway Mallampati: II  TM Distance: >3 FB Neck ROM: full    Dental no notable dental hx.    Pulmonary neg pulmonary ROS, former smoker,    Pulmonary exam normal breath sounds clear to auscultation       Cardiovascular Exercise Tolerance: Good hypertension, negative cardio ROS   Rhythm:regular Rate:Normal     Neuro/Psych PSYCHIATRIC DISORDERS Anxiety  Neuromuscular disease    GI/Hepatic Neg liver ROS, GERD  Medicated,  Endo/Other  negative endocrine ROS  Renal/GU CRFRenal disease  negative genitourinary   Musculoskeletal   Abdominal   Peds  Hematology negative hematology ROS (+)   Anesthesia Other Findings   Reproductive/Obstetrics negative OB ROS                             Anesthesia Physical Anesthesia Plan  ASA: 2  Anesthesia Plan: General   Post-op Pain Management:    Induction:   PONV Risk Score and Plan: Propofol infusion  Airway Management Planned:   Additional Equipment:   Intra-op Plan:   Post-operative Plan:   Informed Consent: I have reviewed the patients History and Physical, chart, labs and discussed the procedure including the risks, benefits and alternatives for the proposed anesthesia with the patient or authorized representative who has indicated his/her understanding and acceptance.     Dental Advisory Given  Plan Discussed with: CRNA  Anesthesia Plan Comments:         Anesthesia Quick Evaluation

## 2021-06-08 NOTE — Op Note (Signed)
[facility] Patient Name: Rachel [name] Procedure Date: 06/08/2021 9:50 AM MRN: [id number] Date of Birth: 1945/11/20 Attending MD: Maylon Peppers ,  CSN: [id number] Age: 76 Admit Type: Outpatient Procedure:                Colonoscopy Indications:              Screening for colorectal malignant neoplasm Providers:                Maylon Peppers, Hughie Closs, RN, Janeece Riggers,                            RN, Aram Candela Referring MD:              Medicines:                Monitored Anesthesia Care Complications:            No immediate complications. Estimated Blood Loss:     Estimated blood loss: none. Procedure:                Pre-Anesthesia Assessment:                           - Prior to the procedure, a History and Physical                            was performed, and patient medications, allergies                            and sensitivities were reviewed. The patient's                            tolerance of previous anesthesia was reviewed.                           - The risks and benefits of the procedure and the                            sedation options and risks were discussed with the                            patient. All questions were answered and informed                            consent was obtained.                           - ASA Grade Assessment: II - A patient with mild                            systemic disease.                           After obtaining informed consent, the colonoscope                            was passed under direct vision. Throughout the  procedure, the patient's blood pressure, pulse, and                            oxygen saturations were monitored continuously. The                            [id number]-HQ190L ([id number]) scope was introduced through                            the anus and advanced to the the cecum, identified                            by appendiceal orifice and ileocecal valve. The                             colonoscopy was performed without difficulty. The                            patient tolerated the procedure well. The quality                            of the bowel preparation was good. Scope In: 10:16:55 AM Scope Out: 11:19:59 AM Scope Withdrawal Time: 0 hours 31 minutes 46 seconds  Total Procedure Duration: 1 hour 3 minutes 4 seconds  Findings:      The perianal and digital rectal examinations were normal.      Five sessile polyps were found in the cecum. The polyps were 3 to 8 mm       in size. These polyps were removed with a cold snare. Resection and       retrieval were complete.      13 sessile polyps were found in the transverse colon and ascending       colon. The polyps were 3 to 10 mm in size. These polyps were removed       with a cold snare. Resection and retrieval were complete.      A 15 mm polyp was found in the transverse colon. The polyp was sessile.       The polyp was removed with a hot snare. Resection was complete, but the       polyp tissue was not retrieved.      Three sessile polyps were found in the descending colon. The polyps were       3 to 6 mm in size. These polyps were removed with a cold snare.       Resection and retrieval were complete.      Non-bleeding internal hemorrhoids were found during retroflexion. The       hemorrhoids were small. Impression:               - Five 3 to 8 mm polyps in the cecum, removed with                            a cold snare. Resected and retrieved.                           - 13 3 to 10 mm polyps in the transverse colon  and                            in the ascending colon, removed with a cold snare.                            Resected and retrieved.                           - One 15 mm polyp in the transverse colon, removed                            with a hot snare. Complete resection. Polyp tissue                            not retrieved.                           - Three 3 to 6 mm polyps in  the descending colon,                            removed with a cold snare. Resected and retrieved.                           - Non-bleeding internal hemorrhoids. Moderate Sedation:      Per Anesthesia Care Recommendation:           - Discharge patient to home (ambulatory).                           - Resume previous diet.                           - Await pathology results.                           - Repeat colonoscopy in 1 year for surveillance. Procedure Code(s):        --- Professional ---                           [phone], Colonoscopy, flexible; with removal of                            tumor(s), polyp(s), or other lesion(s) by snare                            technique Diagnosis Code(s):        --- Professional ---                           K63.5, Polyp of colon                           Z12.11, Encounter for screening for malignant                            neoplasm of  colon                           K64.8, Other hemorrhoids CPT copyright 2019 American [employer] Association. All rights reserved. The codes documented in this report are preliminary and upon coder review may  be revised to meet current compliance requirements. Maylon Peppers, MD Maylon Peppers,  06/08/2021 11:26:23 AM This report has been signed electronically. Number of Addenda: 0

## 2021-06-08 NOTE — Transfer of Care (Signed)
Immediate Anesthesia Transfer of Care Note  Patient: Rachel [name]  Procedure(s) Performed: COLONOSCOPY WITH PROPOFOL POLYPECTOMY  Patient Location: PACU  Anesthesia Type:General  Level of Consciousness: awake, alert , oriented and patient cooperative  Airway & Oxygen Therapy: Patient Spontanous Breathing  Post-op Assessment: Report given to RN, Post -op Vital signs reviewed and stable and Patient moving all extremities X 4  Post vital signs: Reviewed and stable  Last Vitals:  Vitals Value Taken Time  BP 125/78 06/08/21 1124  Temp 36.5 C 06/08/21 1124  Pulse 64 06/08/21 1124  Resp 17 06/08/21 1124  SpO2 100 % 06/08/21 1124    Last Pain:  Vitals:   06/08/21 1124  TempSrc: Oral  PainSc: 0-No pain         Complications: No notable events documented.

## 2021-06-09 ENCOUNTER — Encounter (INDEPENDENT_AMBULATORY_CARE_PROVIDER_SITE_OTHER): Payer: Self-pay | Admitting: *Deleted

## 2021-06-09 ENCOUNTER — Telehealth: Payer: Self-pay | Admitting: Pharmacist

## 2021-06-09 LAB — SURGICAL PATHOLOGY

## 2021-06-09 NOTE — Progress Notes (Signed)
Chronic Care Management Pharmacy Assistant   Name: KENZLY ROGOFF  MRN: [id number] DOB: January 16, 1946   Reason for Encounter: Disease State - Hypertension Call     Recent office visits:  None noted.   Recent consult visits:  05/27/21 Rodell Perna, Md - Orthopedics - Closed nondisplaced fracture - Xrays were ordered. Follow up as needed.  05/20/21 Rodell Perna, MD - DDD - No medication changes. Follow up as scheduled.   Hospital visits: 06/08/21 Medication Reconciliation was completed by comparing discharge summary, patients EMR and Pharmacy list, and upon discussion with patient.  Admitted to the hospital on 06/08/21 due to Colonoscopy. Discharge date was 06/08/21. Discharged from [facility]?Medications Started at [facility] Discharge:?? None noted.   Medication Changes at Hospital Discharge: None noted.  Medications Discontinued at Hospital Discharge: polyethylene glycol-electrolytes 420 g solution (TriLyte)  Medications that remain the same after Hospital Discharge:??  All other medications will remain the same.    Medications: Outpatient Encounter Medications as of 06/09/2021  Medication Sig   albuterol (VENTOLIN HFA) 108 (90 Base) MCG/ACT inhaler Inhale 2 puffs into the lungs every 6 (six) hours as needed for wheezing or shortness of breath.   amLODipine (NORVASC) 5 MG tablet Take 1 tablet (5 mg total) by mouth daily.   Ascorbic Acid (VITA-C PO) Take 1 tablet by mouth daily.   atorvastatin (LIPITOR) 10 MG tablet TAKE 1 TABLET BY MOUTH  DAILY   carboxymethylcellulose (REFRESH PLUS) 0.5 % SOLN Place 1 drop into both eyes in the morning and at bedtime.   docusate sodium (COLACE) 100 MG capsule TAKE 1 CAPSULE BY MOUTH EVERY 12 HOURS   famotidine (PEPCID) 20 MG tablet TAKE 1 TABLET BY MOUTH TWICE A DAY (Patient taking differently: Take 20 mg by mouth daily as needed for heartburn or indigestion.)   lisinopril (ZESTRIL) 20 MG tablet Take 0.5 tablets (10 mg total)  by mouth daily.   meclizine (ANTIVERT) 25 MG tablet Take 25 mg by mouth 3 (three) times daily as needed for dizziness.   pantoprazole (PROTONIX) 40 MG tablet Take 1 tablet (40 mg total) by mouth daily.   propranolol ER (INDERAL LA) 60 MG 24 hr capsule TAKE 1 CAPSULE BY MOUTH  DAILY FOR BLOOD PRESSURE   sertraline (ZOLOFT) 25 MG tablet TAKE 1 TABLET BY MOUTH AT  BEDTIME FOR ANXIETY   Tiotropium Bromide-Olodaterol (STIOLTO RESPIMAT) 2.5-2.5 MCG/ACT AERS Inhale 2 puffs into the lungs daily.   vitamin E 180 MG (400 UNITS) capsule Take 400 Units by mouth daily.   No facility-administered encounter medications on file as of 06/09/2021.    Current antihypertensive regimen:  Amlodipine 10 mg 1 tablet daily  Lisinopril 20 mg 1 tablet daily Propanol 60 mg 1 capsule daily  How often are you checking your Blood Pressure?     Current home BP readings:    What recent interventions/DTPs have been made by any provider to improve Blood Pressure control since last CPP Visit:     Any recent hospitalizations or ED visits since last visit with CPP?  Patient had a hospitalizations on 06/08/21 for Colonoscopy.   What diet changes have been made to improve Blood Pressure Control?     What exercise is being done to improve your Blood Pressure Control?      Adherence Review: Is the patient currently on ACE/ARB medication? Yes Does the patient have >5 day gap between last estimated fill dates? No   Care Gaps   AWV:  overdue  Colonoscopy: scheduled 05/11/21 DM Eye Exam:  DM Foot Exam: Microalbumin: unknown  HbgAIC: done 12/09/20 (5.3) DEXA: done 08/19/19 Mammogram: done 08/19/19   Star Rating Drugs: atorvastatin (LIPITOR) 10 MG tablet - last filled 05/10/21 90 days  lisinopril (ZESTRIL) 20 MG tablet - last filled 05/10/21 90 days   Future Appointments  Date Time Provider [employer]  06/14/2021  4:15 PM Eulogio Bear, NP [facility] None  06/16/2021  4:00 PM Clayton Bibles,  NP [facility] None  06/17/2021  3:30 PM Estill Dooms, NP [facility]  07/13/2021  3:00 PM Paseda, Dewaine Conger, FNP [facility]  Multiple attempts were made to contact patient. Attempts were unsuccessful. / ls,CMA   Jobe Gibbon, [place] Pharmacist Assistant  [phone]

## 2021-06-09 NOTE — Anesthesia Postprocedure Evaluation (Signed)
Anesthesia Post Note  Patient: Rachel [name]  Procedure(s) Performed: COLONOSCOPY WITH PROPOFOL POLYPECTOMY  Patient location during evaluation: Phase II Anesthesia Type: General Level of consciousness: awake Pain management: pain level controlled Vital Signs Assessment: post-procedure vital signs reviewed and stable Respiratory status: spontaneous breathing and respiratory function stable Cardiovascular status: blood pressure returned to baseline and stable Postop Assessment: no headache and no apparent nausea or vomiting Anesthetic complications: no Comments: Late entry   No notable events documented.   Last Vitals:  Vitals:   06/08/21 1006 06/08/21 1124  BP: (!) 137/91 125/78  Pulse: 61 64  Resp: 18 17  Temp: 36.6 C 36.5 C  SpO2: 99% 100%    Last Pain:  Vitals:   06/08/21 1124  TempSrc: Oral  PainSc: 0-No pain                 Louann Sjogren

## 2021-06-11 ENCOUNTER — Encounter (HOSPITAL_COMMUNITY): Payer: Self-pay | Admitting: Gastroenterology

## 2021-06-14 ENCOUNTER — Ambulatory Visit: Payer: Medicare Other | Admitting: Nurse Practitioner

## 2021-06-14 DIAGNOSIS — R69 Illness, unspecified: Secondary | ICD-10-CM | POA: Diagnosis not present

## 2021-06-16 ENCOUNTER — Encounter: Payer: Self-pay | Admitting: Nurse Practitioner

## 2021-06-16 ENCOUNTER — Ambulatory Visit (INDEPENDENT_AMBULATORY_CARE_PROVIDER_SITE_OTHER): Payer: Medicare Other | Admitting: Nurse Practitioner

## 2021-06-16 ENCOUNTER — Other Ambulatory Visit: Payer: Self-pay

## 2021-06-16 VITALS — BP 136/80 | HR 65 | Temp 98.3°F | Ht 64.5 in | Wt 160.0 lb

## 2021-06-16 DIAGNOSIS — R600 Localized edema: Secondary | ICD-10-CM | POA: Diagnosis not present

## 2021-06-16 DIAGNOSIS — J432 Centrilobular emphysema: Secondary | ICD-10-CM | POA: Diagnosis not present

## 2021-06-16 DIAGNOSIS — K219 Gastro-esophageal reflux disease without esophagitis: Secondary | ICD-10-CM

## 2021-06-16 DIAGNOSIS — R0789 Other chest pain: Secondary | ICD-10-CM | POA: Insufficient documentation

## 2021-06-16 DIAGNOSIS — R079 Chest pain, unspecified: Secondary | ICD-10-CM | POA: Insufficient documentation

## 2021-06-16 DIAGNOSIS — R69 Illness, unspecified: Secondary | ICD-10-CM | POA: Diagnosis not present

## 2021-06-16 DIAGNOSIS — Z87891 Personal history of nicotine dependence: Secondary | ICD-10-CM

## 2021-06-16 MED ORDER — STIOLTO RESPIMAT 2.5-2.5 MCG/ACT IN AERS: 2.0000 | INHALATION_SPRAY | Freq: Every day | RESPIRATORY_TRACT | 0 refills | Status: DC

## 2021-06-16 NOTE — Assessment & Plan Note (Signed)
Referral placed to lung cancer screening program. Instructed that if she does not hear from them, to notify us.

## 2021-06-16 NOTE — Assessment & Plan Note (Signed)
Continue PPI therapy and famotidine PRN.
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
## 2021-06-16 NOTE — Patient Instructions (Signed)
Continue Stiolto 2 puffs daily -Continue Albuterol inhaler 2 puffs every 6 hours as needed for shortness of breath or wheezing. Notify if symptoms persist despite rescue inhaler/neb use. -Continue protonix 40 mg daily -Continue pepcid 20 mg as needed for reflux  If you experience chest pain that does not resolve, go to the ED for further evaluation.  Notify if worsening breathlessness, cough, mucus production, fatigue, or wheezing occurs.  Maintain up to date vaccinations, including influenza, COVID, and pneumococcal.  Wash your hands often and avoid sick exposures.  Encouraged masking in crowds.  Avoid triggers, when possible.  Exercise, as tolerated. Notify if worsening symptoms upon exertion occur.   Pulmonary function testing scheduled today.  Referral to lung cancer screening program.  Follow up after pulmonary function testing with Dr. Valeta Harms or Joellen Jersey Eryn Marandola,NP. If symptoms do not improve or worsen, please contact office for sooner follow up or seek emergency care.

## 2021-06-16 NOTE — Assessment & Plan Note (Signed)
Reported by pt. Exam unremarkable and without edema. Given this and chest pains, referral to cardiology placed. Could be r/t norvasc. BNP and BMET today.

## 2021-06-16 NOTE — Assessment & Plan Note (Signed)
Not currently experiencing any pain or cardiac symptoms. Intermittent, sharp on left side of chest. Feels as though it's her heart. No association with activity and resolves spontaneously. Occurred prior to initiating therapy with Stiolto and has not had any worsening. Recent EKG did show evidence of ST depression. Referral to cardiology placed. ED precautions advised.

## 2021-06-16 NOTE — Assessment & Plan Note (Signed)
Improvement in DOE. Continue Stiolto inhaler. Albuterol PRN. PFTs scheduled at today's visit with follow up after.   Patient Instructions  Continue Stiolto 2 puffs daily -Continue Albuterol inhaler 2 puffs every 6 hours as needed for shortness of breath or wheezing. Notify if symptoms persist despite rescue inhaler/neb use. -Continue protonix 40 mg daily -Continue pepcid 20 mg as needed for reflux  If you experience chest pain that does not resolve, go to the ED for further evaluation.  Notify if worsening breathlessness, cough, mucus production, fatigue, or wheezing occurs.  Maintain up to date vaccinations, including influenza, COVID, and pneumococcal.  Wash your hands often and avoid sick exposures.  Encouraged masking in crowds.  Avoid triggers, when possible.  Exercise, as tolerated. Notify if worsening symptoms upon exertion occur.   Pulmonary function testing scheduled today.  Referral to lung cancer screening program.  Follow up after pulmonary function testing with Dr. Valeta Harms or Joellen Jersey Trenia Tennyson,NP. If symptoms do not improve or worsen, please contact office for sooner follow up or seek emergency care.

## 2021-06-17 ENCOUNTER — Ambulatory Visit (INDEPENDENT_AMBULATORY_CARE_PROVIDER_SITE_OTHER): Payer: Medicare Other | Admitting: Adult Health

## 2021-06-17 ENCOUNTER — Encounter: Payer: Self-pay | Admitting: Adult Health

## 2021-06-17 VITALS — BP 133/78 | HR 68 | Ht 64.5 in | Wt 159.2 lb

## 2021-06-17 DIAGNOSIS — Z01419 Encounter for gynecological examination (general) (routine) without abnormal findings: Secondary | ICD-10-CM | POA: Diagnosis not present

## 2021-06-17 DIAGNOSIS — Z90710 Acquired absence of both cervix and uterus: Secondary | ICD-10-CM | POA: Diagnosis not present

## 2021-06-17 DIAGNOSIS — Z1211 Encounter for screening for malignant neoplasm of colon: Secondary | ICD-10-CM | POA: Diagnosis not present

## 2021-06-17 DIAGNOSIS — R69 Illness, unspecified: Secondary | ICD-10-CM | POA: Diagnosis not present

## 2021-06-17 DIAGNOSIS — N952 Postmenopausal atrophic vaginitis: Secondary | ICD-10-CM | POA: Diagnosis not present

## 2021-06-17 LAB — BASIC METABOLIC PANEL
BUN: 8 mg/dL (ref 6–23)
CO2: 30 mEq/L (ref 19–32)
Calcium: 9.5 mg/dL (ref 8.4–10.5)
Chloride: 102 mEq/L (ref 96–112)
Creatinine, Ser: 0.92 mg/dL (ref 0.40–1.20)
GFR: 60.99 mL/min (ref >60.00)
Glucose, Bld: 99 mg/dL (ref 70–99)
Potassium: 3.2 mEq/L — ABNORMAL LOW (ref 3.5–5.1)
Sodium: 142 mEq/L (ref 135–145)

## 2021-06-17 LAB — BRAIN NATRIURETIC PEPTIDE: Pro B Natriuretic peptide (BNP): 63 pg/mL (ref 0.0–100.0)

## 2021-06-17 LAB — HEMOCCULT GUIAC POC 1CARD (OFFICE): Fecal Occult Blood, POC: NEGATIVE

## 2021-06-17 NOTE — Progress Notes (Signed)
Patient ID: Rachel [name], female   DOB: 12/06/1945, 76 y.o.   MRN: [id number] History of Present Illness: Gara is a 76 year old black female,single, sp hysterectomy, in for well woman gyn exam.  Lab Results  Component Value Date   DIAGPAP  05/02/2018    NEGATIVE FOR INTRAEPITHELIAL LESIONS OR MALIGNANCY.   HPV DETECTED (A) 05/02/2018   PCP is Dr Posey Pronto  Current Medications, Allergies, Past Medical History, Past Surgical History, Family History and Social History were reviewed in [facility] record.     Review of Systems: Patient denies any headaches, hearing loss, fatigue, blurred vision, shortness of breath, chest pain, abdominal pain, problems with bowel movements,(constipated uses metamucil), urination, or intercourse(not active). No joint pain or mood swings.  She has had yellowish vaginal discharge since October when went to [place].   Physical Exam:BP 133/78 (BP Location: Right Arm, Patient Position: Sitting, Cuff Size: Normal)    Pulse 68    Ht 5' 4.5" (1.638 m)    Wt 159 lb 3.2 oz (72.2 kg)    BMI 26.90 kg/m   General:  Well developed, well nourished, no acute distress Skin:  Warm and dry Neck:  Midline trachea, normal thyroid, good ROM, no lymphadenopathy,no carotid bruits heard Lungs; Clear to auscultation bilaterally Breast:  No dominant palpable mass, retraction, or nipple discharge Cardiovascular: Regular rate and rhythm Abdomen:  Soft, non tender, no hepatosplenomegaly Pelvic:  External genitalia is normal in appearance, no lesions.  The vagina is pale, with loss of rugae, no discharge noted today, no lesion. Urethra has no lesions or masses. The cervix and uterus are absent.  No adnexal masses or tenderness noted.Bladder is non tender, no masses felt. Rectal: Good sphincter tone, no polyps, or hemorrhoids felt.  Hemoccult negative. Extremities/musculoskeletal:  No swelling or varicosities noted, no clubbing or cyanosis Psych:  No mood changes,  alert and cooperative,seems happy Refused AA Fall risk is low Depression screen [facility] 2/9 06/17/2021 09/25/2020 04/17/2020  Decreased Interest 0 0 0  Down, Depressed, Hopeless 1 1 0  PHQ - 2 Score 1 1 0  Altered sleeping 0 0 -  Tired, decreased energy 3 1 -  Change in appetite 0 0 -  Feeling bad or failure about yourself  0 0 -  Trouble concentrating 0 3 -  Moving slowly or fidgety/restless 0 3 -  Suicidal thoughts 0 0 -  PHQ-9 Score 4 8 -  Difficult doing work/chores - - -  Some recent data might be hidden    She is on zoloft  GAD 7 : Generalized Anxiety Score 06/17/2021 09/25/2020 04/17/2020  Nervous, Anxious, on Edge 2 3 0  Control/stop worrying 0 0 0  Worry too much - different things 1 0 0  Trouble relaxing 0 0 0  Restless 0 0 0  Easily annoyed or irritable 0 1 0  Afraid - awful might happen 1 3 0  Total GAD 7 Score 4 7 0  Anxiety Difficulty - Very difficult Not difficult at all    Examination chaperoned by Celene Squibb LPN   Impression and Plan: 1. Encounter for well woman exam with routine gynecological exam Physical with PCP Mammogram every 1-2 years Labs with PCP Colonoscopy in 1 year, had 06/08/21 with 19 tubular adenomas and several hyperplastic  Follow up prn   2. Encounter for screening fecal occult blood testing   3. S/P hysterectomy Paps not needed  4. Vaginal atrophy Call if yellowish discharge returns, none there today

## 2021-06-17 NOTE — Progress Notes (Signed)
BNP and kidney function nl. No indication of heart failure or kidney dysfunction to correlate to lower extremity edema. I sent the referral to cardiology yesterday for further eval of her intermittent chest pains and edema. Potassium is slightly decreased. I would follow up with her PCP regarding this. Thanks!

## 2021-07-12 ENCOUNTER — Ambulatory Visit: Payer: Medicare Other | Admitting: Nurse Practitioner

## 2021-07-13 ENCOUNTER — Ambulatory Visit: Payer: Medicare Other | Admitting: Nurse Practitioner

## 2021-07-13 ENCOUNTER — Other Ambulatory Visit: Payer: Self-pay

## 2021-07-13 MED ORDER — AMLODIPINE BESYLATE 5 MG PO TABS
5.0000 mg | ORAL_TABLET | Freq: Every day | ORAL | 11 refills | Status: DC
Start: 2021-07-13 — End: 2022-05-24

## 2021-07-15 ENCOUNTER — Other Ambulatory Visit: Payer: Self-pay

## 2021-07-15 ENCOUNTER — Encounter: Payer: Self-pay | Admitting: Nurse Practitioner

## 2021-07-15 ENCOUNTER — Other Ambulatory Visit: Payer: Self-pay | Admitting: Surgery

## 2021-07-15 ENCOUNTER — Ambulatory Visit (INDEPENDENT_AMBULATORY_CARE_PROVIDER_SITE_OTHER): Payer: Medicare Other | Admitting: Nurse Practitioner

## 2021-07-15 VITALS — BP 122/70 | HR 79 | Ht 64.0 in | Wt 156.1 lb

## 2021-07-15 DIAGNOSIS — I7121 Aneurysm of the ascending aorta, without rupture: Secondary | ICD-10-CM | POA: Diagnosis not present

## 2021-07-15 DIAGNOSIS — K219 Gastro-esophageal reflux disease without esophagitis: Secondary | ICD-10-CM

## 2021-07-15 DIAGNOSIS — E782 Mixed hyperlipidemia: Secondary | ICD-10-CM | POA: Diagnosis not present

## 2021-07-15 DIAGNOSIS — G8929 Other chronic pain: Secondary | ICD-10-CM

## 2021-07-15 DIAGNOSIS — I10 Essential (primary) hypertension: Secondary | ICD-10-CM | POA: Diagnosis not present

## 2021-07-15 DIAGNOSIS — E663 Overweight: Secondary | ICD-10-CM

## 2021-07-15 DIAGNOSIS — Z720 Tobacco use: Secondary | ICD-10-CM

## 2021-07-15 DIAGNOSIS — I712 Thoracic aortic aneurysm, without rupture, unspecified: Secondary | ICD-10-CM

## 2021-07-15 DIAGNOSIS — M25571 Pain in right ankle and joints of right foot: Secondary | ICD-10-CM | POA: Diagnosis not present

## 2021-07-15 DIAGNOSIS — J432 Centrilobular emphysema: Secondary | ICD-10-CM | POA: Diagnosis not present

## 2021-07-15 MED ORDER — LISINOPRIL 20 MG PO TABS: 10.0000 mg | ORAL_TABLET | Freq: Every day | ORAL | 0 refills | Status: DC

## 2021-07-15 MED ORDER — LISINOPRIL 20 MG PO TABS: 10.0000 mg | ORAL_TABLET | Freq: Every day | ORAL | 1 refills | Status: DC

## 2021-07-15 NOTE — Assessment & Plan Note (Signed)
smoked one pack of cigarettes a day for 40 years, she quit smoking cigarettes 7 years ago , she is current everyday vape user , need to quit vaping discussed with the pt. She has been trying to do less each day.  She plans to quit vaping by next visit

## 2021-07-15 NOTE — Assessment & Plan Note (Signed)
Takes Protonix 40 mg daily famotidine 20 mg as needed.   Patient told to Continue  Protonix 40 mg daily, no need to continue on famotidine. , avoid fatty fried foods, and other offending foods.

## 2021-07-15 NOTE — Assessment & Plan Note (Signed)
DASH diet and commitment to daily physical activity for a minimum of 30 minutes discussed and encouraged, as a part of hypertension management. °The importance of attaining a healthy weight is also discussed. ° °BP/Weight 07/15/2021 06/17/2021 06/16/2021 06/08/2021 06/04/2021 05/27/2021 05/20/2021  °Systolic BP 122 133 136 125 141 - -  °Diastolic BP 70 78 80 78 88 - -  °Wt. (Lbs) 156.08 159.2 160 - 160 162 162  °BMI 26.79 26.9 27.04 - 27.46 27.81 27.81  ° °Stable condition continue amlodipine 5 mg daily, °Lisinopril 10 mg daily °Propanolol 60 mg daily °CMP+EGFR today °Follow-up in 4 months ° °

## 2021-07-15 NOTE — Patient Instructions (Signed)

## 2021-07-15 NOTE — Assessment & Plan Note (Signed)
Wt Readings from Last 3 Encounters:  07/15/21 156 lb 1.3 oz (70.8 kg)  06/17/21 159 lb 3.2 oz (72.2 kg)  06/16/21 160 lb (72.6 kg)   Importance of healthy food choices with portion control discussed as well as eating regularly within 12  hour window.   The need to choose clean green food 50%-75% of time is discussed as well as make water the primary drink and set a goal for 64 ounces daily.  Patient reeducated about the importance of committment to minimum of 150 minutes of exercise per week.  Three meals at set times with snacks allowed between meals but they must be fruit or vegetable.   Aim to eat  over 12 hour period  for example 7 am to 7 pm. Stop after your last meal of the day.

## 2021-07-15 NOTE — Assessment & Plan Note (Addendum)
Chronic condition.  Able to do range of motion of right ankle today without tenderness Handicap Packing form completed today Take Tylenol 650 mg every 6 hours as needed for right ankle pain.  Referral made to orthopedic surgery, for second opinion.  She has seen 2 different orthopedics recently.

## 2021-07-15 NOTE — Assessment & Plan Note (Addendum)
Stable at 5 cm on last imaging, last visit to cardiology was 01/2021.  Cardiology does semiannual imaging to evaluate condition Avoid heavy lifting, maintain adequate blood pressure control.  BP 122/70 today
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
## 2021-07-15 NOTE — Assessment & Plan Note (Addendum)
Chronic condition, has aortic arthrosclerosis.  takes atorvastatin 10 mg daily. Continue atorvastatin 10 mg daily. Plan to recheck lipid panel at her next visit.  In 4 months Avoid fatty, fried foods butter cheese.  Lab Results  Component Value Date   CHOL 224 (H) 09/25/2020   HDL 94 09/25/2020   LDLCALC 107 (H) 09/25/2020   TRIG 122 09/25/2020   CHOLHDL 2.4 09/25/2020

## 2021-07-15 NOTE — Assessment & Plan Note (Signed)
Patient states stiloto  2 puffs daily, Albuterol inhaler 2 puffs every 6 hours as needed for SOB, wheezing Patient is followed by pulmonology.  Patient told to call pulmonology if has shortness of breath does not get better after using her inhalers Patient vapes  every day, need to quit vaping discussed with patient she verbalized understanding.  She plans to quit vaping by her next visit in 4 months

## 2021-07-16 ENCOUNTER — Encounter: Payer: Self-pay | Admitting: Nurse Practitioner

## 2021-07-16 ENCOUNTER — Ambulatory Visit (INDEPENDENT_AMBULATORY_CARE_PROVIDER_SITE_OTHER): Payer: Medicare Other | Admitting: Nurse Practitioner

## 2021-07-16 ENCOUNTER — Ambulatory Visit (INDEPENDENT_AMBULATORY_CARE_PROVIDER_SITE_OTHER): Payer: Medicare Other | Admitting: Pulmonary Disease

## 2021-07-16 DIAGNOSIS — R0789 Other chest pain: Secondary | ICD-10-CM | POA: Diagnosis not present

## 2021-07-16 DIAGNOSIS — K219 Gastro-esophageal reflux disease without esophagitis: Secondary | ICD-10-CM

## 2021-07-16 DIAGNOSIS — J432 Centrilobular emphysema: Secondary | ICD-10-CM | POA: Diagnosis not present

## 2021-07-16 DIAGNOSIS — Z87891 Personal history of nicotine dependence: Secondary | ICD-10-CM

## 2021-07-16 DIAGNOSIS — R0602 Shortness of breath: Secondary | ICD-10-CM | POA: Diagnosis not present

## 2021-07-16 LAB — PULMONARY FUNCTION TEST
DL/VA % pred: 93 %
DL/VA: 3.82 ml/min/mmHg/L
DLCO cor % pred: 58 %
DLCO cor: 11.39 ml/min/mmHg
DLCO unc % pred: 58 %
DLCO unc: 11.39 ml/min/mmHg
FEF 25-75 Pre: 0.94 L/sec
FEF2575-%Pred-Pre: 61 %
FEV1-%Pred-Pre: 75 %
FEV1-Pre: 1.31 L
FEV1FVC-%Pred-Pre: 95 %
FEV6-%Pred-Pre: 82 %
FEV6-Pre: 1.79 L
FEV6FVC-%Pred-Pre: 104 %
FVC-%Pred-Pre: 79 %
FVC-Pre: 1.79 L
Pre FEV1/FVC ratio: 73 %
Pre FEV6/FVC Ratio: 100 %
RV % pred: 184 %
RV: 4.28 L
TLC % pred: 119 %
TLC: 6.13 L

## 2021-07-16 NOTE — Progress Notes (Deleted)
Full PFT performed today. °

## 2021-07-16 NOTE — Assessment & Plan Note (Signed)
Previously referred to lung cancer screening program.

## 2021-07-16 NOTE — Assessment & Plan Note (Signed)
Moderate to severely reduced DLCO with increased lung volumes on PFTs today.  Will continue with LAMA/LABA therapy.  Advised to notify of any worsening symptoms.  Patient Instructions  -Continue Stiolto 2 puffs daily  -Continue Albuterol inhaler 2 puffs every 6 hours as needed for shortness of breath or wheezing. Notify if symptoms persist despite rescue inhaler/neb use. -Continue protonix 40 mg daily -Continue pepcid 20 mg Twice daily as needed for breakthrough heartburn/indigestion   Notify if worsening breathlessness, cough, mucus production, fatigue, or wheezing occurs.  Maintain up to date vaccinations, including influenza, COVID, and pneumococcal.  Wash your hands often and avoid sick exposures.  Encouraged masking in crowds.  Avoid triggers, when possible.  Exercise, as tolerated. Notify if worsening symptoms upon exertion occur.   Follow up with Dr. Valeta Harms in 3 months. If symptoms do not improve or worsen, please contact office for sooner follow up or seek emergency care.

## 2021-07-16 NOTE — Patient Instructions (Signed)
-  Continue Stiolto 2 puffs daily  -Continue Albuterol inhaler 2 puffs every 6 hours as needed for shortness of breath or wheezing. Notify if symptoms persist despite rescue inhaler/neb use. -Continue protonix 40 mg daily -Continue pepcid 20 mg Twice daily as needed for breakthrough heartburn/indigestion   Notify if worsening breathlessness, cough, mucus production, fatigue, or wheezing occurs.  Maintain up to date vaccinations, including influenza, COVID, and pneumococcal.  Wash your hands often and avoid sick exposures.  Encouraged masking in crowds.  Avoid triggers, when possible.  Exercise, as tolerated. Notify if worsening symptoms upon exertion occur.   Follow up with Dr. Valeta Harms in 3 months. If symptoms do not improve or worsen, please contact office for sooner follow up or seek emergency care.

## 2021-07-16 NOTE — Assessment & Plan Note (Signed)
No acute symptoms and no active chest pain. Awaiting appt with cardiology. Aware of ED precautions.

## 2021-07-16 NOTE — Assessment & Plan Note (Signed)
Well-controlled on PPI and PRN pepcid.
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
## 2021-07-16 NOTE — Patient Instructions (Addendum)
Spirometry/DLCO and Pleth performed today. 

## 2021-07-16 NOTE — Progress Notes (Signed)
Spirometry/DLCO and Pleth performed today. Patient was not able to perform post spirometry without error maxed out attempts.

## 2021-07-19 ENCOUNTER — Other Ambulatory Visit: Payer: Self-pay
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
## 2021-07-21 ENCOUNTER — Encounter: Payer: Self-pay | Admitting: Cardiovascular Disease

## 2021-07-21 ENCOUNTER — Other Ambulatory Visit: Payer: Self-pay

## 2021-07-21 ENCOUNTER — Encounter: Payer: Self-pay | Admitting: *Deleted

## 2021-07-21 ENCOUNTER — Ambulatory Visit (INDEPENDENT_AMBULATORY_CARE_PROVIDER_SITE_OTHER): Payer: Medicare Other | Admitting: Cardiovascular Disease

## 2021-07-21 VITALS — BP 122/76 | HR 84 | Ht 64.5 in | Wt 157.6 lb

## 2021-07-21 DIAGNOSIS — I10 Essential (primary) hypertension: Secondary | ICD-10-CM

## 2021-07-21 DIAGNOSIS — E782 Mixed hyperlipidemia: Secondary | ICD-10-CM

## 2021-07-21 DIAGNOSIS — R079 Chest pain, unspecified: Secondary | ICD-10-CM | POA: Diagnosis not present

## 2021-07-21 DIAGNOSIS — F17200 Nicotine dependence, unspecified, uncomplicated: Secondary | ICD-10-CM | POA: Diagnosis not present

## 2021-07-21 NOTE — Patient Instructions (Signed)
Medication Instructions:  Your physician recommends that you continue on your current medications as directed. Please refer to the Current Medication list given to you today.  *If you need a refill on your cardiac medications before your next appointment, please call your pharmacy*   Lab Work: NONE   If you have labs (blood work) drawn today and your tests are completely normal, you will receive your results only by: [facility] (if you have MyChart) OR A paper copy in the mail If you have any lab test that is abnormal or we need to change your treatment, we will call you to review the results.   Testing/Procedures: Your physician has requested that you have a lexiscan myoview. For further information please visit [email or web address]. Please follow instruction sheet, as given.    Follow-Up: At [facility], you and your health needs are our priority.  As part of our continuing mission to provide you with exceptional heart care, we have created designated Provider Care Teams.  These Care Teams include your primary Cardiologist (physician) and Advanced Practice Providers (APPs -  Physician Assistants and Nurse Practitioners) who all work together to provide you with the care you need, when you need it.  We recommend signing up for the patient portal called "MyChart".  Sign up information is provided on this After Visit Summary.  MyChart is used to connect with patients for Virtual Visits (Telemedicine).  Patients are able to view lab/test results, encounter notes, upcoming appointments, etc.  Non-urgent messages can be sent to your provider as well.   To learn more about what you can do with MyChart, go to [email or web address].    Your next appointment:    As Needed   The format for your next appointment:   In Person  Provider:   Jenkins Rouge, MD    Other Instructions Thank you for choosing [facility]!

## 2021-07-26 DIAGNOSIS — S92001A Unspecified fracture of right calcaneus, initial encounter for closed fracture: Secondary | ICD-10-CM | POA: Diagnosis not present

## 2021-07-29 ENCOUNTER — Other Ambulatory Visit (HOSPITAL_COMMUNITY): Payer: Medicare Other

## 2021-07-29 ENCOUNTER — Other Ambulatory Visit: Payer: Self-pay | Admitting: Orthopaedic Surgery

## 2021-07-29 ENCOUNTER — Other Ambulatory Visit (HOSPITAL_COMMUNITY): Payer: Self-pay | Admitting: Orthopaedic Surgery

## 2021-07-29 ENCOUNTER — Encounter (HOSPITAL_COMMUNITY): Payer: Medicare Other

## 2021-07-29 DIAGNOSIS — M79671 Pain in right foot: Secondary | ICD-10-CM

## 2021-08-02 ENCOUNTER — Other Ambulatory Visit: Payer: Self-pay

## 2021-08-02 ENCOUNTER — Ambulatory Visit (INDEPENDENT_AMBULATORY_CARE_PROVIDER_SITE_OTHER): Payer: Medicare Other

## 2021-08-02 VITALS — BP 125/84

## 2021-08-02 DIAGNOSIS — Z0000 Encounter for general adult medical examination without abnormal findings: Secondary | ICD-10-CM
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
## 2021-08-02 NOTE — Patient Instructions (Signed)
[name] , Thank you for taking time to come for your Medicare Wellness Visit. I appreciate your ongoing commitment to your health goals. Please review the following plan we discussed and let me know if I can assist you in the future.   These are the goals we discussed:  Goals      Pharmacy Care Plan:     CARE PLAN ENTRY (see longitudinal plan of care for additional care plan information)  Current Barriers:  Chronic Disease Management support, education, and care coordination needs related to Hypertension and Hyperlipidemia   Hypertension BP Readings from Last 3 Encounters:  04/17/20 (!) 160/86  03/09/20 (!) 181/103  01/30/20 (!) 160/103  Pharmacist Clinical Goal(s): Over the next 90 days, patient will work with PharmD and providers to achieve BP goal <140/90 Current regimen:  Lisinopril '40mg'$  Propranolol ER '60mg'$  Interventions: Discussed home monitoring, she will record twice daily. Regular follow up initiated, CMA in 30 days. Counseled on medication adherence. Patient self care activities - Over the next 90 days, patient will: Check BP daily, document, and provide at future appointments Ensure daily salt intake < 2300 mg/day Write down BP tonight and tomorrow morning and bring in to PCP visit tomorrow morning.  Hyperlipidemia Lab Results  Component Value Date/Time   LDLCALC 110 (H) 07/31/2019 04:00 PM  Pharmacist Clinical Goal(s): Over the next 90 days, patient will work with PharmD and providers to achieve LDL goal < 100 Current regimen:  No medications Interventions: Reviewed most recent lipid panel Discussed dietary modifications Recommended PCP visit for lipid panel Patient self care activities - Over the next 90 days, patient will: Continue to work on dietary changes limiting saturated fats and processed foods to promote better cholesterol, limit fried foods. Schedule visit with PCP for lipid panel.   Please see past updates related to this goal by clicking  on the "Past Updates" button in the selected goal          This is a list of the screening recommended for you and due dates:  Health Maintenance  Topic Date Due   Zoster (Shingles) Vaccine (1 of 2) Never done   COVID-19 Vaccine (4 - Booster for Moderna series) 04/22/2021   Flu Shot  08/27/2021*   Tetanus Vaccine  09/25/2021*   Hepatitis C Screening: USPSTF Recommendation to screen - Ages 18-79 yo.  05/30/2048*   Colon Cancer Screening  06/09/2031   Pneumonia Vaccine  Completed   DEXA scan (bone density measurement)  Completed   HPV Vaccine  Aged Out  *Topic was postponed. The date shown is not the original due date.  [name] , Thank you for taking time to come for your Medicare Wellness Visit. I appreciate your ongoing commitment to your health goals. Please review the following plan we discussed and let me know if I can assist you in the future.   Screening recommendations/referrals: Colonoscopy: Complete Mammogram: Not due  Bone Density: Complete Recommended yearly ophthalmology/optometry visit for glaucoma screening and checkup Recommended yearly dental visit for hygiene and checkup  Vaccinations: Influenza vaccine: Due now  Pneumococcal vaccine: Complete Tdap vaccine: Due now  Shingles vaccine: Due now     Advanced directives: Mailed patient information   Conditions/risks identified: Hypertension   Next appointment: 1 year    Preventive Care 15 Years and Older, Female Preventive care refers to lifestyle choices and visits with your health care provider that can promote health and wellness. What does preventive care include? A yearly physical exam. This is  also called an annual well check. Dental exams once or twice a year. Routine eye exams. Ask your health care provider how often you should have your eyes checked. Personal lifestyle choices, including: Daily care of your teeth and gums. Regular physical activity. Eating a healthy diet. Avoiding tobacco and  drug use. Limiting alcohol use. Practicing safe sex. Taking low-dose aspirin every day. Taking vitamin and mineral supplements as recommended by your health care provider. What happens during an annual well check? The services and screenings done by your health care provider during your annual well check will depend on your age, overall health, lifestyle risk factors, and family history of disease. Counseling  Your health care provider may ask you questions about your: Alcohol use. Tobacco use. Drug use. Emotional well-being. Home and relationship well-being. Sexual activity. Eating habits. History of falls. Memory and ability to understand (cognition). Work and work Statistician. Reproductive health. Screening  You may have the following tests or measurements: Height, weight, and BMI. Blood pressure. Lipid and cholesterol levels. These may be checked every 5 years, or more frequently if you are over 47 years old. Skin check. Lung cancer screening. You may have this screening every year starting at age 17 if you have a 30-pack-year history of smoking and currently smoke or have quit within the past 15 years. Fecal occult blood test (FOBT) of the stool. You may have this test every year starting at age 73. Flexible sigmoidoscopy or colonoscopy. You may have a sigmoidoscopy every 5 years or a colonoscopy every 10 years starting at age 76. Hepatitis C blood test. Hepatitis B blood test. Sexually transmitted disease (STD) testing. Diabetes screening. This is done by checking your blood sugar (glucose) after you have not eaten for a while (fasting). You may have this done every 1-3 years. Bone density scan. This is done to screen for osteoporosis. You may have this done starting at age 82. Mammogram. This may be done every 1-2 years. Talk to your health care provider about how often you should have regular mammograms. Talk with your health care provider about your test results, treatment  options, and if necessary, the need for more tests. Vaccines  Your health care provider may recommend certain vaccines, such as: Influenza vaccine. This is recommended every year. Tetanus, diphtheria, and acellular pertussis (Tdap, Td) vaccine. You may need a Td booster every 10 years. Zoster vaccine. You may need this after age 63. Pneumococcal 13-valent conjugate (PCV13) vaccine. One dose is recommended after age 6. Pneumococcal polysaccharide (PPSV23) vaccine. One dose is recommended after age 53. Talk to your health care provider about which screenings and vaccines you need and how often you need them. This information is not intended to replace advice given to you by your health care provider. Make sure you discuss any questions you have with your health care provider. Document Released: 06/12/2015 Document Revised: 02/03/2016 Document Reviewed: 03/17/2015 Elsevier Interactive Patient Education  2017 [place] Prevention in the Home Falls can cause injuries. They can happen to people of all ages. There are many things you can do to make your home safe and to help prevent falls. What can I do on the outside of my home? Regularly fix the edges of walkways and driveways and fix any cracks. Remove anything that might make you trip as you walk through a door, such as a raised step or threshold. Trim any bushes or trees on the path to your home. Use bright outdoor lighting. Clear any walking  paths of anything that might make someone trip, such as rocks or tools. Regularly check to see if handrails are loose or broken. Make sure that both sides of any steps have handrails. Any raised decks and porches should have guardrails on the edges. Have any leaves, snow, or ice cleared regularly. Use sand or salt on walking paths during winter. Clean up any spills in your garage right away. This includes oil or grease spills. What can I do in the bathroom? Use night lights. Install grab  bars by the toilet and in the tub and shower. Do not use towel bars as grab bars. Use non-skid mats or decals in the tub or shower. If you need to sit down in the shower, use a plastic, non-slip stool. Keep the floor dry. Clean up any water that spills on the floor as soon as it happens. Remove soap buildup in the tub or shower regularly. Attach bath mats securely with double-sided non-slip rug tape. Do not have throw rugs and other things on the floor that can make you trip. What can I do in the bedroom? Use night lights. Make sure that you have a light by your bed that is easy to reach. Do not use any sheets or blankets that are too big for your bed. They should not hang down onto the floor. Have a firm chair that has side arms. You can use this for support while you get dressed. Do not have throw rugs and other things on the floor that can make you trip. What can I do in the kitchen? Clean up any spills right away. Avoid walking on wet floors. Keep items that you use a lot in easy-to-reach places. If you need to reach something above you, use a strong step stool that has a grab bar. Keep electrical cords out of the way. Do not use floor polish or wax that makes floors slippery. If you must use wax, use non-skid floor wax. Do not have throw rugs and other things on the floor that can make you trip. What can I do with my stairs? Do not leave any items on the stairs. Make sure that there are handrails on both sides of the stairs and use them. Fix handrails that are broken or loose. Make sure that handrails are as long as the stairways. Check any carpeting to make sure that it is firmly attached to the stairs. Fix any carpet that is loose or worn. Avoid having throw rugs at the top or bottom of the stairs. If you do have throw rugs, attach them to the floor with carpet tape. Make sure that you have a light switch at the top of the stairs and the bottom of the stairs. If you do not have them,  ask someone to add them for you. What else can I do to help prevent falls? Wear shoes that: Do not have high heels. Have rubber bottoms. Are comfortable and fit you well. Are closed at the toe. Do not wear sandals. If you use a stepladder: Make sure that it is fully opened. Do not climb a closed stepladder. Make sure that both sides of the stepladder are locked into place. Ask someone to hold it for you, if possible. Clearly mark and make sure that you can see: Any grab bars or handrails. First and last steps. Where the edge of each step is. Use tools that help you move around (mobility aids) if they are needed. These include: Canes. Walkers. Scooters. Crutches.  Turn on the lights when you go into a dark area. Replace any light bulbs as soon as they burn out. Set up your furniture so you have a clear path. Avoid moving your furniture around. If any of your floors are uneven, fix them. If there are any pets around you, be aware of where they are. Review your medicines with your doctor. Some medicines can make you feel dizzy. This can increase your chance of falling. Ask your doctor what other things that you can do to help prevent falls. This information is not intended to replace advice given to you by your health care provider. Make sure you discuss any questions you have with your health care provider. Document Released: 03/12/2009 Document Revised: 10/22/2015 Document Reviewed: 06/20/2014 Elsevier Interactive Patient Education  2017 [employer].

## 2021-08-03 ENCOUNTER — Telehealth: Payer: Self-pay

## 2021-08-03 NOTE — Telephone Encounter (Signed)
Patient need referral for home health. Help in her home with cleaning.  ?

## 2021-08-04 ENCOUNTER — Ambulatory Visit (HOSPITAL_COMMUNITY)
Admission: RE | Admit: 2021-08-04 | Discharge: 2021-08-04 | Disposition: A | Discharge status: Home / Self Care | Payer: Medicare Other | Source: Ambulatory Visit | Attending: Cardiovascular Disease | Admitting: Cardiovascular Disease

## 2021-08-04 ENCOUNTER — Encounter (HOSPITAL_COMMUNITY)
Admission: RE | Admit: 2021-08-04 | Discharge: 2021-08-04 | Disposition: A | Discharge status: Home / Self Care | Payer: Medicare Other | Source: Ambulatory Visit | Attending: Cardiovascular Disease | Admitting: Cardiovascular Disease

## 2021-08-04 ENCOUNTER — Other Ambulatory Visit: Payer: Self-pay

## 2021-08-04 DIAGNOSIS — R079 Chest pain, unspecified: Secondary | ICD-10-CM | POA: Diagnosis not present

## 2021-08-04 LAB — NM MYOCAR MULTI W/SPECT W/WALL MOTION / EF
LV dias vol: 70 mL (ref 46–106)
LV sys vol: 18 mL
Nuc Stress EF: 75 %
Peak HR: 109 {beats}/min
RATE: 0.6
Rest HR: 76 {beats}/min
Rest Nuclear Isotope Dose: 11 mCi
SDS: 7
SRS: 0
SSS: 7
ST Depression (mm): 0 mm
Stress Nuclear Isotope Dose: 33 mCi
TID: 1.18

## 2021-08-04 MED ORDER — SODIUM CHLORIDE FLUSH 0.9 % IV SOLN
INTRAVENOUS | Status: AC
  Administered 2021-08-04: 10 mL via INTRAVENOUS
  Filled 2021-08-04: qty 10

## 2021-08-04 MED ORDER — TECHNETIUM TC 99M TETROFOSMIN IV KIT
10.0000 | PACK | Freq: Once | INTRAVENOUS | Status: AC | PRN
  Administered 2021-08-04: 11 via INTRAVENOUS

## 2021-08-04 MED ORDER — TECHNETIUM TC 99M TETROFOSMIN IV KIT
30.0000 | PACK | Freq: Once | INTRAVENOUS | Status: AC | PRN
  Administered 2021-08-04: 33 via INTRAVENOUS

## 2021-08-04 MED ORDER — REGADENOSON 0.4 MG/5ML IV SOLN
INTRAVENOUS | Status: AC
  Administered 2021-08-04: 0.4 mg via INTRAVENOUS
  Filled 2021-08-04: qty 5

## 2021-08-04 NOTE — Telephone Encounter (Signed)
Please advise 

## 2021-08-05 ENCOUNTER — Other Ambulatory Visit: Payer: Self-pay

## 2021-08-05 DIAGNOSIS — Z742 Need for assistance at home and no other household member able to render care: Secondary | ICD-10-CM

## 2021-08-05 NOTE — Telephone Encounter (Signed)
Pt does not have medicaid and it would be out of pocket spoke with pt she wants to see about different options. Per Velna Hatchet put in ref to management team to assist ?

## 2021-08-11 ENCOUNTER — Other Ambulatory Visit: Payer: Self-pay

## 2021-08-11 ENCOUNTER — Telehealth: Payer: Self-pay | Admitting: Cardiovascular Disease

## 2021-08-11 ENCOUNTER — Ambulatory Visit (HOSPITAL_COMMUNITY)
Admission: RE | Admit: 2021-08-11 | Discharge: 2021-08-11 | Disposition: A | Discharge status: Home / Self Care | Payer: Medicare Other | Source: Ambulatory Visit | Attending: Orthopaedic Surgery | Admitting: Orthopaedic Surgery

## 2021-08-11 DIAGNOSIS — M19071 Primary osteoarthritis, right ankle and foot: Secondary | ICD-10-CM | POA: Diagnosis not present

## 2021-08-11 DIAGNOSIS — M79671 Pain in right foot: Secondary | ICD-10-CM | POA: Insufficient documentation

## 2021-08-11 DIAGNOSIS — S92001A Unspecified fracture of right calcaneus, initial encounter for closed fracture: Secondary | ICD-10-CM | POA: Diagnosis not present

## 2021-08-11 DIAGNOSIS — M7731 Calcaneal spur, right foot: Secondary | ICD-10-CM | POA: Diagnosis not present

## 2021-08-11 NOTE — Telephone Encounter (Signed)
Pt notified and verbalized understanding. Pt had no questions or concerns at this time.  

## 2021-08-11 NOTE — Telephone Encounter (Signed)
Josue Hector, MD  ?08/04/2021  3:13 PM EST   ?  ?Normal myovue study with no evidence of ischemia or infarction  ? ?

## 2021-08-11 NOTE — Telephone Encounter (Signed)
Pt walked in asking about results from Stress Test on 08/04/2021. [phone] is the best number to contact her.  ?

## 2021-08-13 ENCOUNTER — Telehealth: Payer: Self-pay

## 2021-08-13 NOTE — Telephone Encounter (Signed)
? ?  Telephone encounter was:  Successful.  ?08/13/2021 ?Name: Rachel [name] MRN: [id number] DOB: 04/11/1946 ? ?Rachel [name] is a 76 y.o. year old female who is a primary care patient of Renee Rival, FNP . The community resource team was consulted for assistance with  home care ? ?Care guide performed the following interventions: Spoke with patient verified email address Rachel [name].  Sent information for [facility] per patient's request. Letter saved in [place]. ? ?Follow Up Plan:  Care guide will follow up with patient by phone over the next 7-10 days ? ?Rachel [name], [place], CHC ?Care Guide  Embedded Care Coordination ?[facility]  ?[address] ?[place], [place] [address] ???Rachel [name]  ?? [phone]   ?[email or web address] ?  ?

## 2021-08-16 ENCOUNTER — Telehealth: Payer: Self-pay

## 2021-08-16 NOTE — Telephone Encounter (Signed)
? ?  Telephone encounter was:  Unsuccessful.  08/16/2021 ?Name: Rachel [name] MRN: [id number] DOB: 08/08/1945 ? ?Unsuccessful outbound call made today to assist with:   in home care ? ?Outreach Attempt:  2nd Attempt ? ?A HIPAA compliant voice message was left requesting a return call.  Instructed patient to call back at [phone]. Left messages on home and cell phone for patient to return my call regarding in home care information emailed on 08/13/21. ? ?Hobson Lax, [place], CHC ?Care Guide  Embedded Care Coordination ?[facility]  ?[address] ?[place], [place] [address] ???[email or web address]  ?? [phone]   ?[email or web address] ?  ?

## 2021-08-20 ENCOUNTER — Telehealth: Payer: Self-pay

## 2021-08-20 NOTE — Telephone Encounter (Signed)
? ?  Telephone encounter was:  Successful.  ?08/20/2021 ?Name: Rachel [name] MRN: [id number] DOB: 1945-12-30 ? ?Rachel [name] is a 76 y.o. year old female who is a primary care patient of Renee Rival, FNP . The community resource team was consulted for assistance with  in home care, medicaid ? ?Care guide performed the following interventions: Spoke with patient about applying for Medicaid and [facility]. Patient stated that she did not receive email verified email and resent also gave patient  information over the phone. I asked the patient if she needed assistance calling the resources and she stated she did not.  She has my name and contact number. Letter saved in Epic. ? ?Follow Up Plan:  No further follow up planned at this time. The patient has been provided with needed resources. ? ?Cataldo Cosgriff, [place], CHC ?Care Guide  Embedded Care Coordination ?[facility]  ?[address] ?[place], [place] [address] ???[email or web address]  ?? [phone]   ?[email or web address] ?  ?

## 2021-08-23 ENCOUNTER — Ambulatory Visit: Payer: Medicare Other | Admitting: Nurse Practitioner

## 2021-08-25 ENCOUNTER — Ambulatory Visit: Payer: Medicare Other | Admitting: Surgery

## 2021-08-25 ENCOUNTER — Inpatient Hospital Stay: Admission: RE | Admit: 2021-08-25 | Payer: Medicare Other | Source: Ambulatory Visit

## 2021-08-25 ENCOUNTER — Encounter: Payer: Self-pay | Admitting: Surgery

## 2021-08-26 ENCOUNTER — Ambulatory Visit: Payer: Medicare Other | Admitting: Orthopaedic Surgery

## 2021-08-30 DIAGNOSIS — S92001D Unspecified fracture of right calcaneus, subsequent encounter for fracture with routine healing: Secondary | ICD-10-CM | POA: Diagnosis not present

## 2021-10-07 ENCOUNTER — Other Ambulatory Visit: Payer: Self-pay | Admitting: Nurse Practitioner

## 2021-10-18 ENCOUNTER — Ambulatory Visit: Payer: Medicare Other | Admitting: Pulmonary Disease

## 2021-10-21 ENCOUNTER — Ambulatory Visit: Payer: Medicare Other | Admitting: Nurse Practitioner

## 2021-10-24 ENCOUNTER — Other Ambulatory Visit: Payer: Self-pay | Admitting: Nurse Practitioner

## 2021-10-27 ENCOUNTER — Encounter: Payer: Self-pay | Admitting: Nurse Practitioner

## 2021-10-27 ENCOUNTER — Ambulatory Visit (INDEPENDENT_AMBULATORY_CARE_PROVIDER_SITE_OTHER): Payer: Medicare Other | Admitting: Nurse Practitioner

## 2021-10-27 DIAGNOSIS — J432 Centrilobular emphysema: Secondary | ICD-10-CM | POA: Diagnosis not present

## 2021-10-27 DIAGNOSIS — I7121 Aneurysm of the ascending aorta, without rupture: Secondary | ICD-10-CM | POA: Diagnosis not present
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
## 2021-10-27 NOTE — Patient Instructions (Addendum)
-  Continue Stiolto 2 puffs daily  -Continue Albuterol inhaler 2 puffs every 6 hours as needed for shortness of breath or wheezing. Notify if symptoms persist despite rescue inhaler/neb use. -Continue protonix 40 mg daily -Continue pepcid 20 mg Twice daily as needed for breakthrough heartburn/indigestion    Notify if worsening breathlessness, cough, mucus production, fatigue, or wheezing occurs.  Maintain up to date vaccinations, including influenza, COVID, and pneumococcal.   Contact Dr. Vivi Martens office to determine timing of your next scan - [phone]   Follow up with Dr. Valeta Harms in 6 months. If symptoms do not improve or worsen, please contact office for sooner follow up or seek emergency care.

## 2021-10-27 NOTE — Assessment & Plan Note (Signed)
Provided with contact information for Dr. Vivi Martens office. Looks as though CT was ordered but yet to be scheduled. She will contact them tomorrow to determine timing.
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
## 2021-10-27 NOTE — Assessment & Plan Note (Signed)
Compensated on current regimen. Appropriate inhaler technique. Continue LAMA/LABA therapy and PRN albuterol.   Patient Instructions  -Continue Stiolto 2 puffs daily  -Continue Albuterol inhaler 2 puffs every 6 hours as needed for shortness of breath or wheezing. Notify if symptoms persist despite rescue inhaler/neb use. -Continue protonix 40 mg daily -Continue pepcid 20 mg Twice daily as needed for breakthrough heartburn/indigestion    Notify if worsening breathlessness, cough, mucus production, fatigue, or wheezing occurs.  Maintain up to date vaccinations, including influenza, COVID, and pneumococcal.   Contact Dr. Vivi Martens office to determine timing of your next scan - [phone]   Follow up with Dr. Valeta Harms in 6 months. If symptoms do not improve or worsen, please contact office for sooner follow up or seek emergency care.

## 2021-11-01 DIAGNOSIS — S92001D Unspecified fracture of right calcaneus, subsequent encounter for fracture with routine healing: Secondary | ICD-10-CM | POA: Diagnosis not present

## 2021-11-01 DIAGNOSIS — M79671 Pain in right foot: Secondary | ICD-10-CM | POA: Diagnosis not present

## 2021-11-12 ENCOUNTER — Encounter: Payer: Self-pay | Admitting: Nurse Practitioner

## 2021-11-12 ENCOUNTER — Ambulatory Visit (INDEPENDENT_AMBULATORY_CARE_PROVIDER_SITE_OTHER): Payer: Medicare Other | Admitting: Nurse Practitioner

## 2021-11-12 VITALS — BP 134/84 | HR 73 | Ht 64.0 in | Wt 152.0 lb

## 2021-11-12 DIAGNOSIS — E782 Mixed hyperlipidemia: Secondary | ICD-10-CM | POA: Diagnosis not present

## 2021-11-12 DIAGNOSIS — E785 Hyperlipidemia, unspecified: Secondary | ICD-10-CM | POA: Diagnosis not present

## 2021-11-12 DIAGNOSIS — K219 Gastro-esophageal reflux disease without esophagitis: Secondary | ICD-10-CM | POA: Diagnosis not present

## 2021-11-12 DIAGNOSIS — I10 Essential (primary) hypertension: Secondary | ICD-10-CM

## 2021-11-12 DIAGNOSIS — R5383 Other fatigue: Secondary | ICD-10-CM

## 2021-11-12 DIAGNOSIS — E559 Vitamin D deficiency, unspecified: Secondary | ICD-10-CM | POA: Diagnosis not present

## 2021-11-12 MED ORDER — LISINOPRIL 10 MG PO TABS: 10.0000 mg | ORAL_TABLET | Freq: Every day | ORAL | 3 refills | Status: DC

## 2021-11-12 NOTE — Assessment & Plan Note (Signed)
Started 3 months ago Check CBC, TSH, vitamin D levels, B12 and folate Patient encouraged to exercise daily as tolerated at least 150 minutes weekly

## 2021-11-12 NOTE — Assessment & Plan Note (Signed)
Currently on atorvastatin 10 mg daily Check lipid panel Avoid fried fatty foods

## 2021-11-12 NOTE — Assessment & Plan Note (Signed)
BP Readings from Last 3 Encounters:  11/12/21 134/84  10/27/21 112/76  08/02/21 125/84  Chronic condition well-controlled on amlodipine 5 mg daily, lisinopril 10 mg daily, propanolol 60 mg daily Continue current medications DASH diet advised engage in regular walking exercises at least for 50 minutes weekly CMP today

## 2021-11-12 NOTE — Progress Notes (Signed)
   Rachel [name]     MRN: [id number]      DOB: Feb 21, 1946   HPI Ms. Festa with past medical history of essential hypertension, ascending aortic aneurysm, emphysema, GERD, vitamin D deficiency is here for follow up and re-evaluation of chronic medical conditions, medication management   Patient complains of fatigue since the past 3 months , she has to take a break when doing things, denies fever, chills, loss of appetite, abdominal pain , bloody stool, palpitations, chest, pain   Emphysema.  Currently on albuterol inhaler as needed, Stiolto Respimat 2 puffs daily, former smoker denies wheezing, cough, gets tired easily.   Hypertension.  On amlodipine 5 mg daily, lisinopril 10 mg daily, propanolol 60 mg daily patient denies edema, chest pain, dizziness, syncope  Due for shingles vaccine and Tdap vaccine, patient encouraged to get both vaccines at her pharmacy    ROS Denies recent fever or chills. Denies sinus pressure, nasal congestion, ear pain or sore throat. Denies chest congestion, productive cough or wheezing. Denies chest pains, palpitations and leg swelling Denies abdominal pain, nausea, vomiting,diarrhea or constipation.   Denies dysuria, frequency, hesitancy or incontinence. Denies joint pain, swelling and limitation in mobility. Denies headaches, seizures, numbness, or tingling. Denies depression, anxiety or insomnia.    PE  BP 134/84 (BP Location: Right Arm, Cuff Size: Normal)   Pulse 73   Ht '5\' 4"'$  (1.626 m)   Wt 152 lb (68.9 kg)   SpO2 97%   BMI 26.09 kg/m   Patient alert and oriented and in no cardiopulmonary distress.  Chest: Clear to auscultation bilaterally.  CVS: S1, S2 no murmurs, no S3.Regular rate.  ABD: Soft non tender.   Ext: No edema  MS: Adequate ROM spine, shoulders, hips and knees.  Psych: Good eye contact, normal affect. Memory intact not anxious or depressed appearing.  CNS: CN 2-12 intact, power,  normal throughout.no focal deficits  noted.   Assessment & Plan  Essential hypertension, benign BP Readings from Last 3 Encounters:  11/12/21 134/84  10/27/21 112/76  08/02/21 125/84  Chronic condition well-controlled on amlodipine 5 mg daily, lisinopril 10 mg daily, propanolol 60 mg daily Continue current medications DASH diet advised engage in regular walking exercises at least for 50 minutes weekly CMP today  GERD (gastroesophageal reflux disease) Currently taking Protonix 40 mg daily, Pepcid 20 mg as needed Educational materials on GERD given today Avoid fried fatty foods caffeine, coffee, chocolates  Hyperlipidemia Currently on atorvastatin 10 mg daily Check lipid panel Avoid fried fatty foods  Other fatigue Started 3 months ago Check CBC, TSH, vitamin D levels, B12 and folate Patient encouraged to exercise daily as tolerated at least 150 minutes weekly

## 2021-11-12 NOTE — Patient Instructions (Signed)
Please get your shingles vaccine and TDAP vaccine at your pharmacy.    It is important that you exercise regularly at least 30 minutes 5 times a week.  Think about what you will eat, plan ahead. Choose " clean, green, fresh or frozen" over canned, processed or packaged foods which are more sugary, salty and fatty. 70 to 75% of food eaten should be vegetables and fruit. Three meals at set times with snacks allowed between meals, but they must be fruit or vegetables. Aim to eat over a 12 hour period , example 7 am to 7 pm, and STOP after  your last meal of the day. Drink water,generally about 64 ounces per day, no other drink is as healthy. Fruit juice is best enjoyed in a healthy way, by EATING the fruit.  Thanks for choosing [facility], we consider it a privelige to serve you.

## 2021-11-12 NOTE — Assessment & Plan Note (Signed)
Currently taking Protonix 40 mg daily, Pepcid 20 mg as needed Educational materials on GERD given today Avoid fried fatty foods caffeine, coffee, chocolates

## 2021-11-13 LAB — CBC WITH DIFFERENTIAL/PLATELET
Basophils Absolute: 0.1 10*3/uL (ref 0.0–0.2)
Basos: 2 %
EOS (ABSOLUTE): 0.2 10*3/uL (ref 0.0–0.4)
Eos: 4 %
Hematocrit: 40.1 % (ref 34.0–46.6)
Hemoglobin: 13.2 g/dL (ref 11.1–15.9)
Immature Grans (Abs): 0 10*3/uL (ref 0.0–0.1)
Immature Granulocytes: 0 %
Lymphocytes Absolute: 1 10*3/uL (ref 0.7–3.1)
Lymphs: 21 %
MCH: 28.3 pg (ref 26.6–33.0)
MCHC: 32.9 g/dL (ref 31.5–35.7)
MCV: 86 fL (ref 79–97)
Monocytes Absolute: 0.5 10*3/uL (ref 0.1–0.9)
Monocytes: 11 %
Neutrophils Absolute: 2.9 10*3/uL (ref 1.4–7.0)
Neutrophils: 62 %
Platelets: 219 10*3/uL (ref 150–450)
RBC: 4.66 x10E6/uL (ref 3.77–5.28)
RDW: 14 % (ref 11.7–15.4)
WBC: 4.7 10*3/uL (ref 3.4–10.8)

## 2021-11-13 LAB — LIPID PANEL
Chol/HDL Ratio: 2.1 ratio (ref 0.0–4.4)
Cholesterol, Total: 179 mg/dL (ref 100–199)
HDL: 84 mg/dL (ref >39)
LDL Chol Calc (NIH): 76 mg/dL (ref 0–99)
Triglycerides: 109 mg/dL (ref 0–149)
VLDL Cholesterol Cal: 19 mg/dL (ref 5–40)

## 2021-11-13 LAB — B12 AND FOLATE PANEL
Folate: 11.8 ng/mL (ref >3.0)
Vitamin B-12: 481 pg/mL (ref 232–1245)

## 2021-11-13 LAB — CMP14+EGFR
ALT: 15 IU/L (ref 0–32)
AST: 22 IU/L (ref 0–40)
Albumin/Globulin Ratio: 1.4 (ref 1.2–2.2)
Albumin: 4.3 g/dL (ref 3.7–4.7)
Alkaline Phosphatase: 87 IU/L (ref 44–121)
BUN/Creatinine Ratio: 10 — ABNORMAL LOW (ref 12–28)
BUN: 8 mg/dL (ref 8–27)
Bilirubin Total: 0.4 mg/dL (ref 0.0–1.2)
CO2: 23 mmol/L (ref 20–29)
Calcium: 10 mg/dL (ref 8.7–10.3)
Chloride: 100 mmol/L (ref 96–106)
Creatinine, Ser: 0.78 mg/dL (ref 0.57–1.00)
Globulin, Total: 3.1 g/dL (ref 1.5–4.5)
Glucose: 108 mg/dL — ABNORMAL HIGH (ref 70–99)
Potassium: 3.7 mmol/L (ref 3.5–5.2)
Sodium: 141 mmol/L (ref 134–144)
Total Protein: 7.4 g/dL (ref 6.0–8.5)
eGFR: 79 mL/min/{1.73_m2} (ref >59)

## 2021-11-13 LAB — VITAMIN D 25 HYDROXY (VIT D DEFICIENCY, FRACTURES): Vit D, 25-Hydroxy: 50.1 ng/mL (ref 30.0–100.0)

## 2021-11-13 LAB — TSH: TSH: 0.681 u[IU]/mL (ref 0.450–4.500)

## 2021-12-13 DIAGNOSIS — M79671 Pain in right foot: Secondary | ICD-10-CM | POA: Diagnosis not present

## 2021-12-20 ENCOUNTER — Telehealth: Payer: Self-pay

## 2021-12-20 NOTE — Telephone Encounter (Signed)
FYI virtual appt scheduled for 7/25

## 2021-12-20 NOTE — Telephone Encounter (Signed)
Patient called asked for nurse to give her a call regarding write and order for home cleaning to representative with [employer] to have help in her home due to back surgery and foot pain.  Virtual appt scheduled with [place].

## 2021-12-21 ENCOUNTER — Encounter: Payer: Self-pay | Admitting: Nurse Practitioner

## 2021-12-21 ENCOUNTER — Ambulatory Visit (INDEPENDENT_AMBULATORY_CARE_PROVIDER_SITE_OTHER): Payer: Medicare Other | Admitting: Nurse Practitioner

## 2021-12-21 DIAGNOSIS — G8929 Other chronic pain: Secondary | ICD-10-CM | POA: Diagnosis not present

## 2021-12-21 DIAGNOSIS — M25571 Pain in right ankle and joints of right foot: Secondary | ICD-10-CM | POA: Diagnosis not present

## 2021-12-21 DIAGNOSIS — Z742 Need for assistance at home and no other household member able to render care: Secondary | ICD-10-CM | POA: Diagnosis not present
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
## 2021-12-21 NOTE — Assessment & Plan Note (Signed)
Condition followed by orthopedics Needs assistance with home cleaning Referral sent to CCM to see if theres any assistance that can be provided by her insurance

## 2021-12-21 NOTE — Assessment & Plan Note (Signed)
  Needs assistance with home cleaning due to her chronic right ankle pain ,  Referral sent to CCM to see if theres any assistance that can be provided by her insurance

## 2022-01-13 ENCOUNTER — Other Ambulatory Visit: Payer: Self-pay

## 2022-01-13 DIAGNOSIS — I10 Essential (primary) hypertension: Secondary | ICD-10-CM

## 2022-01-17 ENCOUNTER — Telehealth: Payer: Self-pay

## 2022-01-17 NOTE — Telephone Encounter (Signed)
   Telephone encounter was:  Successful.  01/17/2022 Name: Rachel [name] MRN: [id number] DOB: [birth year]  Rachel [name] is a 76 y.o. year old female who is a primary care patient of Renee Rival, FNP . The community resource team was consulted for assistance with  in home assistance.  Care guide performed the following interventions: Spoke with patient about [place].  I asked the patient if she needed assistance calling the resource and she stated she did not.  She has my name and contact number if she does not receive the letter in the next 7-10 business days. Letter saved in Epic.     Follow Up Plan:  No further follow up planned at this time. The patient has been provided with needed resources.  Deneice Wack, AAS Paralegal, [facility]  [address], [place] [address] ??[email or web address]  ?? [phone]   [email or web address]

## 2022-01-21 ENCOUNTER — Telehealth: Payer: Self-pay

## 2022-01-21 NOTE — Telephone Encounter (Signed)
   Telephone encounter was:  Unsuccessful.  01/21/2022 Name: Rachel [name] MRN: [id number] DOB: Jun 13, 1945  Unsuccessful outbound call made today to assist with:  Left message on voicemail to give patient the number again for [place] [phone].  Outreach Attempt:  3rd Attempt.  Referral closed unable to contact patient.  A HIPAA compliant voice message was left requesting a return call.  Instructed patient to call back at [phone].  Arrin Ishler, AAS Paralegal, [facility]  [address], [place] [address] ??[email or web address]  ?? [phone]   [email or web address]

## 2022-01-26 ENCOUNTER — Telehealth: Payer: Self-pay | Admitting: *Deleted

## 2022-01-26 NOTE — Chronic Care Management (AMB) (Signed)
  Chronic Care Management   Note  01/26/2022 Name: Rachel [name] MRN: [id number] DOB: 04/28/1946  Rachel [name] is a 76 y.o. year old female who is a primary care patient of Renee Rival, FNP. I reached out to Rachel [name] by phone today in response to a referral sent by Rachel [name] PCP.  Rachel [name] was given information about Chronic Care Management services today including:  CCM service includes personalized support from designated clinical staff supervised by her physician, including individualized plan of care and coordination with other care providers 24/7 contact phone numbers for assistance for urgent and routine care needs. Service will only be billed when office clinical staff spend 20 minutes or more in a month to coordinate care. Only one practitioner may furnish and bill the service in a calendar month. The patient may stop CCM services at any time (effective at the end of the month) by phone call to the office staff. The patient is responsible for co-pay (up to 20% after annual deductible is met) if co-pay is required by the individual health plan.   Patient agreed to services and verbal consent obtained.   Follow up plan: Telephone appointment with care management team member scheduled for:02/03/22 [place]: [phone]

## 2022-01-28 ENCOUNTER — Encounter: Payer: Self-pay | Admitting: Family Medicine

## 2022-01-28 ENCOUNTER — Ambulatory Visit (INDEPENDENT_AMBULATORY_CARE_PROVIDER_SITE_OTHER): Payer: Medicare Other | Admitting: Family Medicine

## 2022-01-28 DIAGNOSIS — B9689 Other specified bacterial agents as the cause of diseases classified elsewhere: Secondary | ICD-10-CM | POA: Diagnosis not present

## 2022-01-28 DIAGNOSIS — N760 Acute vaginitis: Secondary | ICD-10-CM

## 2022-01-28 MED ORDER — METRONIDAZOLE 500 MG PO TABS: 500.0000 mg | ORAL_TABLET | Freq: Two times a day (BID) | ORAL | 0 refills | Status: AC
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
## 2022-02-03 ENCOUNTER — Telehealth: Payer: Self-pay

## 2022-02-10 ENCOUNTER — Ambulatory Visit: Payer: Self-pay | Admitting: *Deleted

## 2022-02-10 ENCOUNTER — Encounter: Payer: Self-pay | Admitting: *Deleted

## 2022-02-10 NOTE — Patient Outreach (Signed)
  Care Coordination   Initial Visit Note   02/10/2022  Name: Rachel [name] MRN: [id number] DOB: [birth year]  Rachel [name] is a 76 y.o. year old female who sees [place], Dewaine Conger, FNP for primary care. I spoke with Rachel [name] by phone today.  What matters to the patients health and wellness today?  Receive Assistance Arranging In-Home Care Services.   Goals Addressed               This Visit's Progress     Receive Assistance Arranging In-Home Care Services. (pt-stated)   On track     Care Coordination Interventions:   Discussed plans with patient for ongoing care management follow up. Provided patient with direct contact information for care management team. Screening for signs and symptoms of depression related to chronic disease state.  Assessed social determinant of health barriers. Active listening/reflection utilized.  Problem solving/task-centered strategies developed. Quality of sleep assessed and sleep hygiene techniques promoted.  Verbalization of feelings encouraged.  Discussed caregiver resources and support. Mailed the following list of agencies and resources, on 02/10/2022:     [place] Providers 2023 Medicaid Tips Medicaid Application Encouraged to review the list of agencies and resources provided, and be prepared to discuss during our next scheduled telephone outreach call.        SDOH assessments and interventions completed:  Yes.  SDOH Interventions Today    Flowsheet Row Most Recent Value  SDOH Interventions   Food Insecurity Interventions Intervention Not Indicated  Housing Interventions Intervention Not Indicated  Transportation Interventions Intervention Not Indicated  Utilities Interventions Intervention Not Indicated  Alcohol Usage Interventions Intervention Not Indicated (Score <7)   Financial Strain Interventions Intervention Not Indicated  Physical Activity Interventions Intervention Not Indicated  Stress Interventions Intervention Not Indicated  Social Connections Interventions Intervention Not Indicated        Care Coordination Interventions Activated:  Yes.   Care Coordination Interventions:  Yes, provided.   Follow up plan: Follow up call scheduled for 02/22/2022 at 10:15 am.  Encounter Outcome:  Pt. Visit Completed.   Nat Christen, BSW, MSW, LCSW  Licensed Education officer, environmental Health System  Mailing [place] [address] [place], [place] [address] Physical Address-[address] [place], [place] [address] Toll Free Main # [phone] Fax # [phone] Cell # [phone] [email or web address]

## 2022-02-10 NOTE — Patient Instructions (Signed)
Visit Information  Thank you for taking time to visit with me today. Please don't hesitate to contact me if I can be of assistance to you.   Following are the goals we discussed today:   Goals Addressed               This Visit's Progress     [place]. (pt-stated)   On track     Care Coordination Interventions:   Discussed plans with patient for ongoing care management follow up. Provided patient with direct contact information for care management team. Screening for signs and symptoms of depression related to chronic disease state.  Assessed social determinant of health barriers. Active listening/reflection utilized.  Problem solving/task-centered strategies developed. Quality of sleep assessed and sleep hygiene techniques promoted.  Verbalization of feelings encouraged.  Discussed caregiver resources and support. Mailed the following list of agencies and resources, on 02/10/2022:     [place] Providers 2023 Medicaid Tips Medicaid Application Encouraged to review the list of agencies and resources provided, and be prepared to discuss during our next scheduled telephone outreach call.        Our next appointment is by telephone on 02/22/2022 at 10:15 am.  Please call the care guide team at [phone] if you need to cancel or reschedule your appointment.   If you are experiencing a Mental Health or [place] or need someone to talk to, please call the Suicide and Crisis Lifeline: 988 call the [place] National Suicide Prevention Lifeline: 321-304-7099 or TTY: 9731094575 TTY 336 603 6324) to talk to a trained counselor call 1-800-273-TALK (toll free, 24 hour hotline) go to [facility] [address] [place] [phone]) call the  [place]: [phone] call 911  Patient verbalizes understanding of instructions and care plan provided today and agrees to view in [place]. Active MyChart status and patient understanding of how to access instructions and care plan via MyChart confirmed with patient.     Telephone follow up appointment with care management team member scheduled for:  02/22/2022 at 10:15 am.  Nat Christen, BSW, MSW, [place]  Licensed Clinical Social Worker  [place]  Mailing [place] [place], [place] [address] Physical Address[address], [place], [place] [address] Toll Free Main # [phone] Fax # [phone] Cell # [phone] [email or web address]

## 2022-02-15 ENCOUNTER — Ambulatory Visit (INDEPENDENT_AMBULATORY_CARE_PROVIDER_SITE_OTHER): Payer: Medicare Other | Admitting: Family Medicine

## 2022-02-15 ENCOUNTER — Encounter: Payer: Self-pay | Admitting: Family Medicine

## 2022-02-15 DIAGNOSIS — N760 Acute vaginitis: Secondary | ICD-10-CM | POA: Diagnosis not present

## 2022-02-15 DIAGNOSIS — B9689 Other specified bacterial agents as the cause of diseases classified elsewhere: Secondary | ICD-10-CM

## 2022-02-15 MED ORDER — TINIDAZOLE 500 MG PO TABS: 500.0000 mg | ORAL_TABLET | Freq: Every day | ORAL | 0 refills | Status: DC

## 2022-02-15 MED ORDER — TINIDAZOLE 500 MG PO TABS: 500.0000 mg | ORAL_TABLET | Freq: Two times a day (BID) | ORAL | 0 refills | Status: AC
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
## 2022-02-16 ENCOUNTER — Telehealth: Payer: Self-pay

## 2022-02-16 NOTE — Telephone Encounter (Signed)
Patient called said the provider prescribe same medicine as last week, drug store has same as before, patient said was suppose to be some newer medication and stronger dose. Please contact patient @ [phone].

## 2022-02-16 NOTE — Telephone Encounter (Signed)
Patient called back in regard to previous tele

## 2022-02-17 NOTE — Telephone Encounter (Signed)
Called back and lvm that the med she has last week was flagyl and the one that was just called in was tindamax which was different and to call back with concerns

## 2022-02-17 NOTE — Telephone Encounter (Signed)
Patient called left voicemail to return her call.

## 2022-02-18 NOTE — Telephone Encounter (Signed)
Called pt no answer left vm 

## 2022-02-18 NOTE — Telephone Encounter (Signed)
Patient return call. ?

## 2022-02-18 NOTE — Telephone Encounter (Signed)
Spoke with pt advised of Brandy's message

## 2022-02-22 ENCOUNTER — Ambulatory Visit: Payer: Self-pay | Admitting: *Deleted

## 2022-02-22 NOTE — Patient Outreach (Signed)
  Care Coordination   02/22/2022  Name: Rachel [name] MRN: [id number] DOB: 08/30/1945   Care Coordination Outreach Attempts:  An unsuccessful telephone outreach was attempted today to offer the patient information about available care coordination services as a benefit of their health plan. HIPAA compliant messages left on voicemail for patient, providing contact information for CSW, encouraging patient to return CSW's call at her earliest convenience.  Follow Up Plan:  Additional outreach attempts will be made to offer the patient care coordination information and services.   Encounter Outcome:  No Answer.   Care Coordination Interventions Activated:  No.    Care Coordination Interventions:  No, not indicated.    Nat Christen, BSW, MSW, LCSW  Licensed Education officer, environmental Health System  Mailing [place] [address] [place], [place] [address] Physical Address[address], [place], [place] [address] Toll Free Main # [phone] Fax # [phone] Cell # [phone] [name]

## 2022-03-04 ENCOUNTER — Ambulatory Visit (INDEPENDENT_AMBULATORY_CARE_PROVIDER_SITE_OTHER): Payer: Medicare Other | Admitting: Family Medicine

## 2022-03-04 ENCOUNTER — Encounter: Payer: Self-pay | Admitting: Family Medicine

## 2022-03-04 VITALS — BP 124/68 | HR 78 | Ht 64.5 in | Wt 151.0 lb

## 2022-03-04 DIAGNOSIS — A64 Unspecified sexually transmitted disease: Secondary | ICD-10-CM

## 2022-03-04 DIAGNOSIS — N760 Acute vaginitis: Secondary | ICD-10-CM | POA: Diagnosis not present

## 2022-03-04 DIAGNOSIS — Z113 Encounter for screening for infections with a predominantly sexual mode of transmission: Secondary | ICD-10-CM

## 2022-03-04 DIAGNOSIS — B9689 Other specified bacterial agents as the cause of diseases classified elsewhere: Secondary | ICD-10-CM | POA: Diagnosis not present

## 2022-03-04 DIAGNOSIS — Z23 Encounter for immunization: Secondary | ICD-10-CM

## 2022-03-04 DIAGNOSIS — G4762 Sleep related leg cramps: Secondary | ICD-10-CM | POA: Diagnosis not present

## 2022-03-04 NOTE — Assessment & Plan Note (Signed)
Patient educated on CDC recommendation for the vaccine. Verbal consent was obtained from the patient, vaccine administered by nurse, no sign of adverse reactions noted at this time. Patient education on arm soreness and use of tylenol or ibuprofen for this patient  was discussed. Patient educated on the signs and symptoms of adverse effect and advise to contact the office if they occur.  
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
## 2022-03-04 NOTE — Assessment & Plan Note (Signed)
Encourage the patient to drink tonic water to relieve leg cramps Encouraged to take daily vitamin B complex 3 times daily containing 30 mg of vitamin B6 Encouraged to take vitamin D 800 IU before bed to ease leg cramps Also advised the patient to eat bananas can alleviate leg cramps

## 2022-03-04 NOTE — Assessment & Plan Note (Addendum)
Recurrent Informed the patient that bacterial vaginosis is more common in women who use vaginal douches or family hygiene products that disrupt the normal bacterial balance in the vagina informed the patient that the vagina cleans itself and keeps itself healthy by maintaining the correct pH balance and cleaning itself with natural secretions Informed the patient that using soap, spray, gels or douching often disrupt  the bacterial balance in the vagina and this can lead to bacterial vaginosis, yeast infection, or other irritations Encourage the patient to stop using feminine hygiene products and scented products to clean the vagina Informed the patient to only use water to clean the vagina NU swab order

## 2022-03-04 NOTE — Patient Instructions (Addendum)
I appreciate the opportunity to provide care to you today!    Follow up:  05/13/22  Bacterial vaginosis is not a sexually transmitted disease. It is an overgrowth of specific anaerobic bacteria (bacteria that do not need oxygen to grow) in the vagina.  It is more common in women who use vaginal douches or feminine hygiene products that disrupt the normal bacterial balance in the vagina.  Products that interfere with your vagina's natural bacteria balance can cause the bacteria to grow irregularly. These products include douches (genital cleansing products) or certain soap products. It is best to avoid using these products. If you do use soap, stay away from products that are scented or colored.   Your vagina cleans itself and keeps itself healthy by maintaining the correct pH balance and cleaning itself with natural secretions.  Your vagina contains a lot of "good" bacteria. These bacteria maintain the ideal pH balance in your vagina, which is slightly acidic.  The acidic pH makes it hard for "bad" bacteria to infect your vagina. When you use soaps, sprays, or gels, you disrupt the bacterial balance. This can result in bacterial vaginosis, yeast infection, and other irritation.   Nocturnal leg cramps Drinking tonic water Take daily vitamin B complex (three times daily, containing 30 mg of vitamin B6) or  Vitamin E (800 international units before bed)   Please continue to a heart-healthy diet and increase your physical activities. Try to exercise for 58mns at least three times a week.      It was a pleasure to see you and I look forward to continuing to work together on your health and well-being. Please do not hesitate to call the office if you need care or have questions about your care.   Have a wonderful day and week. With Gratitude, GAlvira MondayMSN, FNP-BC

## 2022-03-09 LAB — NUSWAB VAGINITIS PLUS (VG+)
Candida albicans, NAA: NEGATIVE
Candida glabrata, NAA: NEGATIVE
Chlamydia trachomatis, NAA: NEGATIVE
Neisseria gonorrhoeae, NAA: NEGATIVE
Trich vag by NAA: NEGATIVE

## 2022-03-09 NOTE — Progress Notes (Signed)
Please inform the patient that her swab came back negative for BV and yeast infection

## 2022-03-10 ENCOUNTER — Telehealth: Payer: Self-pay | Admitting: Family Medicine

## 2022-03-10 NOTE — Telephone Encounter (Signed)
Patient called in for lab results  ?

## 2022-03-11 NOTE — Telephone Encounter (Signed)
Left vm letting pt know results are neg for bv swab.

## 2022-03-18 ENCOUNTER — Ambulatory Visit: Payer: Self-pay | Admitting: *Deleted

## 2022-03-21 ENCOUNTER — Encounter: Payer: Self-pay | Admitting: *Deleted

## 2022-03-21 NOTE — Patient Outreach (Signed)
  Care Coordination   Follow Up Visit Note   03/21/2022  Name: Rachel [name] MRN: [id number] DOB: 19-Mar-1946  Rachel [name] is a 76 y.o. year old female who sees Alvira Monday, [place] for primary care. I spoke with Rachel [name] by phone today.  What matters to the patients health and wellness today?   Receive Assistance Arranging In-Home Care Services.   Goals Addressed               This Visit's Progress     COMPLETED: [place]. (pt-stated)   On track     Care Coordination Interventions:   Problem solving/task-centered strategies employed. Reviewed the following list of agencies and resources:     [place] Providers 2023 Medicaid Tips Medicaid Application Please contact CSW directly [phone]) if you change your mind about wanting to receive social work services and resources.        SDOH assessments and interventions completed:  Yes.  Care Coordination Interventions Activated:  Yes.   Care Coordination Interventions:  Yes, provided.   Follow up plan: No further intervention required.   Encounter Outcome:  Pt. Visit Completed.   Nat Christen, BSW, MSW, LCSW  Licensed Education officer, environmental Health System  Mailing [place] [address] [place], [place] [address] Physical Address[address], [place], [place] [address] Toll Free Main # [phone] Fax # [phone] Cell # [phone] [email or web address]

## 2022-03-21 NOTE — Patient Instructions (Signed)
Visit Information  Thank you for taking time to visit with me today. Please don't hesitate to contact me if I can be of assistance to you.   Following are the goals we discussed today:   Goals Addressed               This Visit's Progress     COMPLETED: [place]. (pt-stated)   On track     Care Coordination Interventions:   Problem solving/task-centered strategies employed. Reviewed the following list of agencies and resources:     [place] Providers 2023 Medicaid Tips Medicaid Application Please contact CSW directly [phone]) if you change your mind about wanting to receive social work services and resources.       Please call the care guide team at [phone] if you need to cancel or reschedule your appointment.   If you are experiencing a Mental Health or [place] or need someone to talk to, please call the Suicide and Crisis Lifeline: 988 call the [place] National Suicide Prevention Lifeline: 514-859-2397 or TTY: 310-371-2447 TTY (279)798-9318) to talk to a trained counselor call 1-800-273-TALK (toll free, 24 hour hotline) go to [facility] [address] [place] [phone]) call the [place]: [phone] call 911  Patient verbalizes understanding of instructions and care plan provided today and agrees to view in [place]. Active MyChart status and patient understanding of how to access instructions and care plan via MyChart confirmed with patient.     No further follow up required.  Nat Christen, BSW, MSW, LCSW  Licensed Education officer, environmental Health System  Mailing [place] [address] [place], [place] [address] Physical Address[address] [place], [place] [address] Toll Free Main # [phone] Fax # [phone] Cell # [phone] [email or web address]

## 2022-03-24 ENCOUNTER — Other Ambulatory Visit: Payer: Self-pay | Admitting: Nurse Practitioner

## 2022-03-24 DIAGNOSIS — F419 Anxiety disorder, unspecified: Secondary | ICD-10-CM

## 2022-05-13 ENCOUNTER — Ambulatory Visit: Payer: Medicare Other | Admitting: Family Medicine

## 2022-05-13 ENCOUNTER — Ambulatory Visit: Payer: Medicare Other | Admitting: Nurse Practitioner

## 2022-05-18 ENCOUNTER — Encounter (INDEPENDENT_AMBULATORY_CARE_PROVIDER_SITE_OTHER): Payer: Self-pay | Admitting: *Deleted

## 2022-05-19 ENCOUNTER — Other Ambulatory Visit: Payer: Self-pay | Admitting: Nurse Practitioner

## 2022-06-06 ENCOUNTER — Ambulatory Visit (INDEPENDENT_AMBULATORY_CARE_PROVIDER_SITE_OTHER): Payer: 59 | Admitting: Family Medicine

## 2022-06-06 ENCOUNTER — Encounter: Payer: Self-pay | Admitting: Family Medicine

## 2022-06-06 VITALS — BP 157/98 | HR 70 | Ht 64.0 in | Wt 156.0 lb

## 2022-06-06 DIAGNOSIS — R7301 Impaired fasting glucose: Secondary | ICD-10-CM

## 2022-06-06 DIAGNOSIS — E559 Vitamin D deficiency, unspecified: Secondary | ICD-10-CM | POA: Diagnosis not present

## 2022-06-06 DIAGNOSIS — K219 Gastro-esophageal reflux disease without esophagitis: Secondary | ICD-10-CM | POA: Diagnosis not present

## 2022-06-06 DIAGNOSIS — E7849 Other hyperlipidemia: Secondary | ICD-10-CM | POA: Diagnosis not present

## 2022-06-06 DIAGNOSIS — Z23 Encounter for immunization: Secondary | ICD-10-CM

## 2022-06-06 DIAGNOSIS — I10 Essential (primary) hypertension: Secondary | ICD-10-CM

## 2022-06-06 DIAGNOSIS — N760 Acute vaginitis: Secondary | ICD-10-CM

## 2022-06-06 DIAGNOSIS — E038 Other specified hypothyroidism: Secondary | ICD-10-CM
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
## 2022-06-06 NOTE — Patient Instructions (Signed)
I appreciate the opportunity to provide care to you today!    Follow up:  4 months  Labs: please stop by the lab during the week to get your blood drawn (CBC, CMP, TSH, Lipid profile, HgA1c, Vit D)      Please continue to a heart-healthy diet and increase your physical activities. Try to exercise for 58mns at least three times a week.      It was a pleasure to see you and I look forward to continuing to work together on your health and well-being. Please do not hesitate to call the office if you need care or have questions about your care.   Have a wonderful day and week. With Gratitude, GAlvira MondayMSN, FNP-BC

## 2022-06-06 NOTE — Assessment & Plan Note (Signed)
She complains of light yellow vaginal discharge She denies odor, itching, and burning Pending NuSwab

## 2022-06-06 NOTE — Assessment & Plan Note (Addendum)
She takes Norvasc 5 mg daily, lisinopril 10 mg daily, and propranolol 60 mg daily Patient is asymptomatic Will assess CBC and CMP today Encouraged to continue treatment regimen

## 2022-06-06 NOTE — Assessment & Plan Note (Signed)
Stable on Protonix 40 mg daily. 

## 2022-06-06 NOTE — Assessment & Plan Note (Signed)
Patient educated on CDC recommendation for the vaccine. Verbal consent was obtained from the patient, vaccine administered by nurse, no sign of adverse reactions noted at this time. Patient education on arm soreness and use of tylenol or ibuprofen for this patient  was discussed. Patient educated on the signs and symptoms of adverse effect and advise to contact the office if they occur.  

## 2022-06-06 NOTE — Assessment & Plan Note (Signed)
She takes Lipitor 10 mg daily She denies muscle aches and pain Encouraged to continue on treatment regimen Will assess lipid panel today

## 2022-06-09 LAB — NUSWAB VAGINITIS PLUS (VG+)
Candida albicans, NAA: NEGATIVE
Candida glabrata, NAA: NEGATIVE
Chlamydia trachomatis, NAA: NEGATIVE
Neisseria gonorrhoeae, NAA: NEGATIVE
Trich vag by NAA: NEGATIVE

## 2022-06-10 NOTE — Progress Notes (Signed)
Please inform the patient that her nuswab was negative for yeast and bacterial vaginosis

## 2022-06-15 ENCOUNTER — Encounter: Payer: Self-pay | Admitting: *Deleted

## 2022-06-27 ENCOUNTER — Telehealth: Payer: Self-pay | Admitting: Family Medicine

## 2022-06-27 NOTE — Telephone Encounter (Signed)
Handicap placard   Noted  Copied  Sleeved

## 2022-07-05 ENCOUNTER — Other Ambulatory Visit: Payer: Self-pay

## 2022-07-07 NOTE — Telephone Encounter (Signed)
Forms were picked up. 

## 2022-07-13 ENCOUNTER — Other Ambulatory Visit: Payer: Self-pay

## 2022-07-13 DIAGNOSIS — Z87891 Personal history of nicotine dependence: Secondary | ICD-10-CM

## 2022-07-21 ENCOUNTER — Other Ambulatory Visit: Payer: Self-pay | Admitting: Nurse Practitioner

## 2022-08-02 ENCOUNTER — Other Ambulatory Visit: Payer: Self-pay | Admitting: Nurse Practitioner

## 2022-08-08 ENCOUNTER — Encounter: Payer: Medicare Other | Admitting: Nurse Practitioner

## 2022-08-26 ENCOUNTER — Ambulatory Visit (INDEPENDENT_AMBULATORY_CARE_PROVIDER_SITE_OTHER): Payer: 59 | Admitting: Family Medicine

## 2022-08-26 ENCOUNTER — Encounter: Payer: Self-pay | Admitting: Family Medicine

## 2022-08-26 VITALS — Ht 64.0 in | Wt 151.0 lb

## 2022-08-26 DIAGNOSIS — Z0000 Encounter for general adult medical examination without abnormal findings: Secondary | ICD-10-CM
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
## 2022-09-06 ENCOUNTER — Encounter: Payer: Self-pay | Admitting: Acute Care

## 2022-09-06 ENCOUNTER — Ambulatory Visit (INDEPENDENT_AMBULATORY_CARE_PROVIDER_SITE_OTHER): Payer: 59 | Admitting: Acute Care

## 2022-09-06 DIAGNOSIS — Z87891 Personal history of nicotine dependence: Secondary | ICD-10-CM

## 2022-09-06 NOTE — Progress Notes (Signed)
Virtual Visit via Telephone Note  I connected with Rachel [name] on 09/06/22 at  2:00 PM EDT by telephone and verified that I am speaking with the correct person using two identifiers.  Location: Patient: At home Provider: [address], [place], [place], [address]    I discussed the limitations, risks, security and privacy concerns of performing an evaluation and management service by telephone and the availability of in person appointments. I also discussed with the patient that there may be a patient responsible charge related to this service. The patient expressed understanding and agreed to proceed.    Shared Decision Making Visit Lung Cancer Screening Program [phone])   Eligibility: Age 77 y.o. Pack Years Smoking History Calculation 26 pack year smoking history (# packs/per year x # years smoked) Recent History of coughing up blood  no Unexplained weight loss? no ( >Than 15 pounds within the last 6 months ) Prior History Lung / other cancer no (Diagnosis within the last 5 years already requiring surveillance chest CT Scans). Smoking Status Former Smoker Former Smokers: Years since quit: 9 years  Quit Date: 2015  Visit Components: Discussion included one or more decision making aids. yes Discussion included risk/benefits of screening. yes Discussion included potential follow up diagnostic testing for abnormal scans. yes Discussion included meaning and risk of over diagnosis. yes Discussion included meaning and risk of False Positives. yes Discussion included meaning of total radiation exposure. yes  Counseling Included: Importance of adherence to annual lung cancer LDCT screening. yes Impact of comorbidities on ability to participate in the program. yes Ability and willingness to under diagnostic treatment. yes  Smoking Cessation Counseling: Current Smokers:  Discussed importance of smoking cessation. yes Information about tobacco cessation classes and  interventions provided to patient. yes Patient provided with "ticket" for LDCT Scan. yes Symptomatic Patient. no  Counseling NA Diagnosis Code: Tobacco Use Z72.0 Asymptomatic Patient yes  Counseling (Intermediate counseling: > three minutes counseling) [id number] Former Smokers:  Discussed the importance of maintaining cigarette abstinence. yes Diagnosis Code: Personal History of Nicotine Dependence. D53.299 Information about tobacco cessation classes and interventions provided to patient. Yes Patient provided with "ticket" for LDCT Scan. yes Written Order for Lung Cancer Screening with LDCT placed in Epic. Yes (CT Chest Lung Cancer Screening Low Dose W/O CM) [id number] Z12.2-Screening of respiratory organs Z87.891-Personal history of nicotine dependence  I spent 25 minutes of face to face time/virtual visit time  with  Rachel [name] discussing the risks and benefits of lung cancer screening. We took the time to pause the power point at intervals to allow for questions to be asked and answered to ensure understanding. We discussed that she had taken the single most powerful action possible to decrease her risk of developing lung cancer when she quit smoking. I counseled her to remain smoke free, and to contact me if she ever had the desire to smoke again so that I can provide resources and tools to help support the effort to remain smoke free. We discussed the time and location of the scan, and that either  Abigail Miyamoto RN, Karlton Lemon, RN or I  or I will call / send a letter with the results within  24-72 hours of receiving them. She has the office contact information in the event she needs to speak with me,  she verbalized understanding of all of the above and had no further questions upon leaving the office.     I explained to the patient that there has  been a high incidence of coronary artery disease noted on these exams. I explained that this is a non-gated exam therefore degree or severity cannot  be determined. This patient is on statin therapy. I have asked the patient to follow-up with their PCP regarding any incidental finding of coronary artery disease and management with diet or medication as they feel is clinically indicated. The patient verbalized understanding of the above and had no further questions.     Bevelyn Ngo, NP 09/06/2022

## 2022-09-06 NOTE — Patient Instructions (Signed)
Thank you for participating in the [facility] Screening Program. It was our pleasure to meet you today. We will call you with the results of your scan within the next few days. Your scan will be assigned a Lung RADS category score by the physicians reading the scans.  This Lung RADS score determines follow up scanning.  See below for description of categories, and follow up screening recommendations. We will be in touch to schedule your follow up screening annually or based on recommendations of our providers. We will fax a copy of your scan results to your Primary Care Physician, or the physician who referred you to the program, to ensure they have the results. Please call the office if you have any questions or concerns regarding your scanning experience or results.  Our office number is [phone]. Please speak with Denise Phelps, RN. , or  Denise Buckner RN, They are  our Lung Cancer Screening RN.'s If They are unavailable when you call, Please leave a message on the voice mail. We will return your call at our earliest convenience.This voice mail is monitored several times a day.  Remember, if your scan is normal, we will scan you annually as long as you continue to meet the criteria for the program. (Age 50-80, Current smoker or smoker who has quit within the last 15 years). If you are a smoker, remember, quitting is the single most powerful action that you can take to decrease your risk of lung cancer and other pulmonary, breathing related problems. We know quitting is hard, and we are here to help.  Please let us know if there is anything we can do to help you meet your goal of quitting. If you are a former smoker, congratulations. We are proud of you! Remain smoke free! Remember you can refer friends or family members through the number above.  We will screen them to make sure they meet criteria for the program. Thank you for helping us take better care of you by  participating in Lung Screening.  You can receive free nicotine replacement therapy ( patches, gum or mints) by calling 1-800-QUIT NOW. Please call so we can get you on the path to becoming  a non-smoker. I know it is hard, but you can do this!  Lung RADS Categories:  Lung RADS 1: no nodules or definitely non-concerning nodules.  Recommendation is for a repeat annual scan in 12 months.  Lung RADS 2:  nodules that are non-concerning in appearance and behavior with a very low likelihood of becoming an active cancer. Recommendation is for a repeat annual scan in 12 months.  Lung RADS 3: nodules that are probably non-concerning , includes nodules with a low likelihood of becoming an active cancer.  Recommendation is for a 6-month repeat screening scan. Often noted after an upper respiratory illness. We will be in touch to make sure you have no questions, and to schedule your 6-month scan.  Lung RADS 4 A: nodules with concerning findings, recommendation is most often for a follow up scan in 3 months or additional testing based on our provider's assessment of the scan. We will be in touch to make sure you have no questions and to schedule the recommended 3 month follow up scan.  Lung RADS 4 B:  indicates findings that are concerning. We will be in touch with you to schedule additional diagnostic testing based on our provider's  assessment of the scan.  Other options for assistance in smoking cessation (   As covered by your insurance benefits)  Hypnosis for smoking cessation  [employer] [phone]  Acupuncture for smoking cessation  [facility] [phone]   

## 2022-09-07 ENCOUNTER — Ambulatory Visit (HOSPITAL_COMMUNITY)
Admission: RE | Admit: 2022-09-07 | Discharge: 2022-09-07 | Disposition: A | Discharge status: Home / Self Care | Payer: 59 | Source: Ambulatory Visit | Attending: Family Medicine | Admitting: Family Medicine

## 2022-09-07 DIAGNOSIS — I700 Atherosclerosis of aorta: Secondary | ICD-10-CM | POA: Diagnosis not present

## 2022-09-07 DIAGNOSIS — Z87891 Personal history of nicotine dependence: Secondary | ICD-10-CM | POA: Diagnosis not present

## 2022-09-07 DIAGNOSIS — Z122 Encounter for screening for malignant neoplasm of respiratory organs: Secondary | ICD-10-CM | POA: Insufficient documentation

## 2022-09-07 DIAGNOSIS — J439 Emphysema, unspecified: Secondary | ICD-10-CM | POA: Insufficient documentation

## 2022-09-07 DIAGNOSIS — I7121 Aneurysm of the ascending aorta, without rupture: Secondary | ICD-10-CM | POA: Insufficient documentation

## 2022-09-09 ENCOUNTER — Other Ambulatory Visit: Payer: Self-pay | Admitting: Acute Care

## 2022-09-09 DIAGNOSIS — Z122 Encounter for screening for malignant neoplasm of respiratory organs: Secondary | ICD-10-CM

## 2022-09-09 DIAGNOSIS — Z87891 Personal history of nicotine dependence: Secondary | ICD-10-CM

## 2022-10-03 ENCOUNTER — Other Ambulatory Visit: Payer: Self-pay | Admitting: Family Medicine

## 2022-10-03 MED ORDER — PANTOPRAZOLE SODIUM 40 MG PO TBEC: 40.0000 mg | DELAYED_RELEASE_TABLET | ORAL | 2 refills | Status: DC | PRN

## 2022-10-05 ENCOUNTER — Ambulatory Visit (INDEPENDENT_AMBULATORY_CARE_PROVIDER_SITE_OTHER): Payer: 59 | Admitting: Family Medicine

## 2022-10-05 ENCOUNTER — Encounter: Payer: Self-pay | Admitting: Family Medicine

## 2022-10-05 VITALS — BP 132/82 | HR 60 | Wt 150.1 lb

## 2022-10-05 DIAGNOSIS — K219 Gastro-esophageal reflux disease without esophagitis: Secondary | ICD-10-CM

## 2022-10-05 DIAGNOSIS — E7849 Other hyperlipidemia: Secondary | ICD-10-CM | POA: Diagnosis not present

## 2022-10-05 DIAGNOSIS — G8929 Other chronic pain: Secondary | ICD-10-CM

## 2022-10-05 DIAGNOSIS — M79671 Pain in right foot: Secondary | ICD-10-CM

## 2022-10-05 DIAGNOSIS — I10 Essential (primary) hypertension: Secondary | ICD-10-CM

## 2022-10-05 DIAGNOSIS — R7301 Impaired fasting glucose: Secondary | ICD-10-CM | POA: Diagnosis not present

## 2022-10-05 DIAGNOSIS — E038 Other specified hypothyroidism: Secondary | ICD-10-CM | POA: Diagnosis not present

## 2022-10-05 DIAGNOSIS — E559 Vitamin D deficiency, unspecified: Secondary | ICD-10-CM | POA: Diagnosis not present

## 2022-10-05 NOTE — Assessment & Plan Note (Addendum)
She takes Norvasc 5 mg daily, lisinopril 10 mg daily, and propranolol 60 mg daily Patient is asymptomatic Encouraged to continue treatment regimen BP Readings from Last 3 Encounters:  10/05/22 132/82  06/06/22 (!) 157/98  03/04/22 124/68
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
## 2022-10-05 NOTE — Assessment & Plan Note (Signed)
Stable on Protonix 40 mg as needed

## 2022-10-05 NOTE — Assessment & Plan Note (Signed)
Chronic condition Onset of symptoms after wearing a tight fitting sandal 4 to 5 years ago Complains of increased pain of the right pinky toe when she wears narrow fitting shoes or shoes that compresses the affected site Range of motion is intact No deformity seen Pain reported with palpation of the affected site No recent injury or trauma She reports conservative management by wearing a wide fitting shoes, and foot pads and reports minimal relief We will place a referral into podiatry for further evaluation patient's symptoms

## 2022-10-05 NOTE — Patient Instructions (Addendum)
I appreciate the opportunity to provide care to you today!    Follow up:  4 months  Labs: next visit   Referrals today- podiatry   Please continue to a heart-healthy diet and increase your physical activities. Try to exercise for at least five days a week.      It was a pleasure to see you and I look forward to continuing to work together on your health and well-being. Please do not hesitate to call the office if you need care or have questions about your care.   Have a wonderful day and week. With Gratitude, Gilmore Laroche MSN, FNP-BC

## 2022-10-06 LAB — CMP14+EGFR
ALT: 26 IU/L (ref 0–32)
AST: 34 IU/L (ref 0–40)
Albumin/Globulin Ratio: 1.4 (ref 1.2–2.2)
Albumin: 4.6 g/dL (ref 3.8–4.8)
Alkaline Phosphatase: 63 IU/L (ref 44–121)
BUN/Creatinine Ratio: 14 (ref 12–28)
BUN: 13 mg/dL (ref 8–27)
Bilirubin Total: 0.6 mg/dL (ref 0.0–1.2)
CO2: 22 mmol/L (ref 20–29)
Calcium: 10.8 mg/dL — ABNORMAL HIGH (ref 8.7–10.3)
Chloride: 103 mmol/L (ref 96–106)
Creatinine, Ser: 0.93 mg/dL (ref 0.57–1.00)
Globulin, Total: 3.3 g/dL (ref 1.5–4.5)
Glucose: 95 mg/dL (ref 70–99)
Potassium: 4 mmol/L (ref 3.5–5.2)
Sodium: 142 mmol/L (ref 134–144)
Total Protein: 7.9 g/dL (ref 6.0–8.5)
eGFR: 64 mL/min/{1.73_m2} (ref >59)

## 2022-10-06 LAB — TSH+FREE T4
Free T4: 1.23 ng/dL (ref 0.82–1.77)
TSH: 0.954 u[IU]/mL (ref 0.450–4.500)

## 2022-10-06 LAB — CBC WITH DIFFERENTIAL/PLATELET
Basophils Absolute: 0.1 10*3/uL (ref 0.0–0.2)
Basos: 1 %
EOS (ABSOLUTE): 0.1 10*3/uL (ref 0.0–0.4)
Eos: 1 %
Hematocrit: 41.3 % (ref 34.0–46.6)
Hemoglobin: 13.4 g/dL (ref 11.1–15.9)
Immature Grans (Abs): 0 10*3/uL (ref 0.0–0.1)
Immature Granulocytes: 0 %
Lymphocytes Absolute: 1.5 10*3/uL (ref 0.7–3.1)
Lymphs: 33 %
MCH: 28.9 pg (ref 26.6–33.0)
MCHC: 32.4 g/dL (ref 31.5–35.7)
MCV: 89 fL (ref 79–97)
Monocytes Absolute: 0.5 10*3/uL (ref 0.1–0.9)
Monocytes: 12 %
Neutrophils Absolute: 2.4 10*3/uL (ref 1.4–7.0)
Neutrophils: 53 %
Platelets: 251 10*3/uL (ref 150–450)
RBC: 4.64 x10E6/uL (ref 3.77–5.28)
RDW: 13.7 % (ref 11.7–15.4)
WBC: 4.6 10*3/uL (ref 3.4–10.8)

## 2022-10-06 LAB — HEMOGLOBIN A1C
Est. average glucose Bld gHb Est-mCnc: 108 mg/dL
Hgb A1c MFr Bld: 5.4 % (ref 4.8–5.6)

## 2022-10-06 LAB — VITAMIN D 25 HYDROXY (VIT D DEFICIENCY, FRACTURES): Vit D, 25-Hydroxy: 46.6 ng/mL (ref 30.0–100.0)

## 2022-10-06 LAB — LIPID PANEL
Chol/HDL Ratio: 2 ratio (ref 0.0–4.4)
Cholesterol, Total: 210 mg/dL — ABNORMAL HIGH (ref 100–199)
HDL: 104 mg/dL (ref >39)
LDL Chol Calc (NIH): 74 mg/dL (ref 0–99)
Triglycerides: 201 mg/dL — ABNORMAL HIGH (ref 0–149)
VLDL Cholesterol Cal: 32 mg/dL (ref 5–40)

## 2022-10-06 NOTE — Progress Notes (Signed)
Please inform the patient that her triglyceride levels are elevated.  I recommend taking over-the-counter fish oil 2000 mg twice daily.  Her thyroid, kidneys, and liver are stable

## 2022-10-11 DIAGNOSIS — Z961 Presence of intraocular lens: Secondary | ICD-10-CM | POA: Diagnosis not present

## 2022-10-17 ENCOUNTER — Ambulatory Visit (INDEPENDENT_AMBULATORY_CARE_PROVIDER_SITE_OTHER): Payer: 59 | Admitting: Family Medicine

## 2022-10-17 ENCOUNTER — Encounter: Payer: Self-pay | Admitting: Family Medicine

## 2022-10-17 ENCOUNTER — Ambulatory Visit: Payer: 59 | Admitting: Family Medicine

## 2022-10-17 VITALS — BP 130/86 | HR 71 | Wt 152.0 lb

## 2022-10-17 DIAGNOSIS — M79671 Pain in right foot: Secondary | ICD-10-CM | POA: Diagnosis not present

## 2022-10-17 DIAGNOSIS — G5690 Unspecified mononeuropathy of unspecified upper limb: Secondary | ICD-10-CM | POA: Diagnosis not present

## 2022-10-17 DIAGNOSIS — G8929 Other chronic pain: Secondary | ICD-10-CM | POA: Diagnosis not present

## 2022-10-17 DIAGNOSIS — M5136 Other intervertebral disc degeneration, lumbar region: Secondary | ICD-10-CM

## 2022-10-17 DIAGNOSIS — M51369 Other intervertebral disc degeneration, lumbar region without mention of lumbar back pain or lower extremity pain: Secondary | ICD-10-CM

## 2022-10-17 DIAGNOSIS — M48061 Spinal stenosis, lumbar region without neurogenic claudication: Secondary | ICD-10-CM | POA: Diagnosis not present
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
## 2022-10-17 NOTE — Patient Instructions (Addendum)
I appreciate the opportunity to provide care to you today!    Follow up:  02/06/2023    Referrals today-  Home Health   Please continue to a heart-healthy diet and increase your physical activities. Try to exercise for at least five days a week.      It was a pleasure to see you and I look forward to continuing to work together on your health and well-being. Please do not hesitate to call the office if you need care or have questions about your care.   Have a wonderful day and week. With Gratitude, Gilmore Laroche MSN, FNP-BC

## 2022-10-17 NOTE — Assessment & Plan Note (Addendum)
Complains of lower back pain with prolonged standing History of back surgery September 2020 The patient is unable to sweep or mop her floors due to increased back pain She reports frequently sitting after standing for 1 to 2 hours due to increased back pain She does takes Tylenol with relief of pain No recent trauma or injury to lower back reported The patient would like assistance with her ADLs if possible Lower back pain is nonradiating No red flag symptoms reported Referral placed to home health for physical therapy and a home health aide Encouraged to continue over-the-counter analgesic, heat and cold therapy

## 2022-10-18 ENCOUNTER — Ambulatory Visit: Payer: 59 | Admitting: Family Medicine

## 2022-10-20 ENCOUNTER — Ambulatory Visit (INDEPENDENT_AMBULATORY_CARE_PROVIDER_SITE_OTHER): Payer: 59

## 2022-10-20 ENCOUNTER — Ambulatory Visit (INDEPENDENT_AMBULATORY_CARE_PROVIDER_SITE_OTHER): Payer: 59 | Admitting: Podiatry

## 2022-10-20 ENCOUNTER — Other Ambulatory Visit: Payer: Self-pay | Admitting: Podiatry

## 2022-10-20 ENCOUNTER — Ambulatory Visit: Payer: 59 | Admitting: Podiatry

## 2022-10-20 ENCOUNTER — Encounter: Payer: Self-pay | Admitting: Podiatry

## 2022-10-20 DIAGNOSIS — M898X7 Other specified disorders of bone, ankle and foot: Secondary | ICD-10-CM

## 2022-10-20 DIAGNOSIS — M778 Other enthesopathies, not elsewhere classified: Secondary | ICD-10-CM

## 2022-10-20 NOTE — Addendum Note (Signed)
Addended by: Herbie Saxon on: 10/20/2022 08:39 AM   Modules accepted: Orders
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
## 2022-10-26 ENCOUNTER — Other Ambulatory Visit: Payer: Self-pay | Admitting: Family Medicine

## 2022-10-26 DIAGNOSIS — M5136 Other intervertebral disc degeneration, lumbar region: Secondary | ICD-10-CM

## 2022-11-07 ENCOUNTER — Telehealth: Payer: Self-pay | Admitting: Family Medicine

## 2022-11-07 NOTE — Telephone Encounter (Signed)
Nolberto Hanlon called from [facility] said does not think patient does not want services. [facility] calls patient and says going out of town and will get in touch with them when return.  [facility] has been trying to reach patient and leave messages. [phone]

## 2022-11-07 NOTE — Telephone Encounter (Signed)
Noted  

## 2023-02-05 ENCOUNTER — Other Ambulatory Visit: Payer: Self-pay | Admitting: Family Medicine

## 2023-02-06 ENCOUNTER — Encounter: Payer: Self-pay | Admitting: Family Medicine

## 2023-02-06 ENCOUNTER — Ambulatory Visit (INDEPENDENT_AMBULATORY_CARE_PROVIDER_SITE_OTHER): Payer: 59 | Admitting: Family Medicine

## 2023-02-06 VITALS — BP 139/84 | HR 98 | Ht 64.0 in | Wt 150.1 lb

## 2023-02-06 DIAGNOSIS — Z23 Encounter for immunization: Secondary | ICD-10-CM

## 2023-02-06 DIAGNOSIS — I10 Essential (primary) hypertension: Secondary | ICD-10-CM | POA: Diagnosis not present

## 2023-02-06 DIAGNOSIS — E559 Vitamin D deficiency, unspecified: Secondary | ICD-10-CM | POA: Diagnosis not present

## 2023-02-06 DIAGNOSIS — R7301 Impaired fasting glucose: Secondary | ICD-10-CM | POA: Diagnosis not present

## 2023-02-06 DIAGNOSIS — E7849 Other hyperlipidemia: Secondary | ICD-10-CM | POA: Diagnosis not present

## 2023-02-06 DIAGNOSIS — E038 Other specified hypothyroidism: Secondary | ICD-10-CM

## 2023-02-06 DIAGNOSIS — K219 Gastro-esophageal reflux disease without esophagitis: Secondary | ICD-10-CM | POA: Diagnosis not present
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
## 2023-02-06 NOTE — Patient Instructions (Addendum)
I appreciate the opportunity to provide care to you today!    Follow up:  4 months  Fasting Labs: please stop by the lab 2-3 days before your next appointment to get your blood drawn (CBC, CMP, TSH, Lipid profile, HgA1c, Vit D)  Office number for pulmonary is [phone].   Healthy Tips for Weight Loss: Increase Intake of Nutrient-Rich Foods: Prioritize fruits, vegetables, and whole grains. Incorporate Lean Proteins: Include chicken, fish, beans, and legumes in your diet. Choose Low-Fat Dairy Products: Opt for dairy products that are low in fat. Reduce Unhealthy Fats: Limit saturated fats, trans fatty acids, and cholesterol. Aim for Regular Physical Activity: Engage in at least 30 minutes of brisk walking or other physical activities on at least 5 days a week.   Attached with your AVS, you will find valuable resources for self-education. I highly recommend dedicating some time to thoroughly examine them.   Please continue to a heart-healthy diet and increase your physical activities. Try to exercise for at least five days a week.    It was a pleasure to see you and I look forward to continuing to work together on your health and well-being. Please do not hesitate to call the office if you need care or have questions about your care.  In case of emergency, please visit the Emergency Department for urgent care, or contact our clinic at [phone] to schedule an appointment. We're here to help you!   Have a wonderful day and week. With Gratitude, Gilmore Laroche MSN, FNP-BC

## 2023-02-09 NOTE — Assessment & Plan Note (Signed)
Encouraged the patient to take Protonix 40 mg as needed for symptomatic management GERD diet encourage

## 2023-02-09 NOTE — Assessment & Plan Note (Signed)
Controlled Low-sodium diet with increased physical activity encourage BP Readings from Last 3 Encounters:  02/06/23 139/84  10/17/22 130/86  10/05/22 132/82

## 2023-02-09 NOTE — Assessment & Plan Note (Signed)
Encouraged the patient to increase her intake of cholesterol, trans fat, saturated fat with increased physical activity Along show take atorvastatin 10 mg daily Annual lipid panel today Lab Results  Component Value Date   CHOL 210 (H) 10/05/2022   HDL 104 10/05/2022   LDLCALC 74 10/05/2022   TRIG 201 (H) 10/05/2022   CHOLHDL 2.0 10/05/2022

## 2023-04-04 ENCOUNTER — Other Ambulatory Visit: Payer: Self-pay | Admitting: Family Medicine

## 2023-05-02 ENCOUNTER — Other Ambulatory Visit: Payer: Self-pay | Admitting: Family Medicine

## 2023-06-01 DIAGNOSIS — H26493 Other secondary cataract, bilateral: Secondary | ICD-10-CM | POA: Diagnosis not present

## 2023-06-09 ENCOUNTER — Ambulatory Visit: Payer: 59 | Admitting: Family Medicine

## 2023-08-01 ENCOUNTER — Ambulatory Visit: Payer: 59 | Admitting: Family Medicine

## 2023-08-07 ENCOUNTER — Telehealth: Payer: Self-pay | Admitting: Acute Care

## 2023-08-07 NOTE — Telephone Encounter (Signed)
 Patient would like to do a full body CT scan. Patient phone number is [phone].

## 2023-08-08 NOTE — Telephone Encounter (Signed)
 Called and spoke with patient. Advises she was requesting a "whole body scan" for recent back issues. Complains of intermittent surges/discomfort in her lower back and side. No n/v/d. No urinary symptoms. Pt to complete annual LCS 09/08/2023 as planned. Advised there was no indication for a PET scan at this time based on last years annual LDCT. Pt verbalized understanding and will f/u with PCP and discuss back discomfort.

## 2023-08-10 DIAGNOSIS — Z961 Presence of intraocular lens: Secondary | ICD-10-CM | POA: Diagnosis not present

## 2023-08-10 DIAGNOSIS — H26493 Other secondary cataract, bilateral: Secondary | ICD-10-CM | POA: Diagnosis not present

## 2023-08-10 DIAGNOSIS — H40013 Open angle with borderline findings, low risk, bilateral: Secondary | ICD-10-CM | POA: Diagnosis not present

## 2023-09-08 ENCOUNTER — Ambulatory Visit (HOSPITAL_COMMUNITY)
Admission: RE | Admit: 2023-09-08 | Discharge: 2023-09-08 | Disposition: A | Discharge status: Home / Self Care | Source: Ambulatory Visit | Attending: Acute Care | Admitting: Acute Care

## 2023-09-08 DIAGNOSIS — Z87891 Personal history of nicotine dependence: Secondary | ICD-10-CM | POA: Insufficient documentation

## 2023-09-08 DIAGNOSIS — Z122 Encounter for screening for malignant neoplasm of respiratory organs: Secondary | ICD-10-CM | POA: Insufficient documentation

## 2023-09-11 ENCOUNTER — Ambulatory Visit (INDEPENDENT_AMBULATORY_CARE_PROVIDER_SITE_OTHER): Payer: 59

## 2023-09-11 VITALS — Ht 64.0 in | Wt 150.0 lb

## 2023-09-11 DIAGNOSIS — Z0001 Encounter for general adult medical examination with abnormal findings: Secondary | ICD-10-CM | POA: Diagnosis not present

## 2023-09-11 DIAGNOSIS — Z780 Asymptomatic menopausal state: Secondary | ICD-10-CM | POA: Diagnosis not present

## 2023-09-11 DIAGNOSIS — Z1231 Encounter for screening mammogram for malignant neoplasm of breast: Secondary | ICD-10-CM

## 2023-09-11 DIAGNOSIS — F17210 Nicotine dependence, cigarettes, uncomplicated: Secondary | ICD-10-CM

## 2023-09-11 DIAGNOSIS — Z0000 Encounter for general adult medical examination without abnormal findings: Secondary | ICD-10-CM
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
## 2023-09-11 NOTE — Patient Instructions (Signed)
 Rachel [name] , Thank you for taking time to come for your Medicare Wellness Visit. I appreciate your ongoing commitment to your health goals. Please review the following plan we discussed and let me know if I can assist you in the future.   Referrals/Orders/Follow-Ups/Clinician Recommendations:  Next Medicare AWV: September 11, 2024 at 12:30pm   A mammogram and osteoporosis screening were ordered for you today. You've asked that I scheduled those. I will get those scheduled and call you with the appointment information.   You have been referred to have a low dose lung cancer screening test. If you haven't heard from them in a week please call one of the numbers listed below.   Kandice Robinsons [address] [place] [address] [place] Phone: [phone]   This is a list of the screening recommended for you and due dates:  Health Maintenance  Topic Date Due   Zoster (Shingles) Vaccine (1 of 2) Never done   DTaP/Tdap/Td vaccine (2 - Tdap) 11/27/2016   DEXA scan (bone density measurement)  08/18/2021   Mammogram  09/15/2021   COVID-19 Vaccine (4 - 2024-25 season) 01/29/2023   Screening for Lung Cancer  09/07/2023   Hepatitis C Screening  05/30/2048*   Flu Shot  12/29/2023   Medicare Annual Wellness Visit  09/10/2024   Pneumonia Vaccine  Completed   HPV Vaccine  Aged Out   Meningitis B Vaccine  Aged Out   Colon Cancer Screening  Discontinued  *Topic was postponed. The date shown is not the original due date.    Advanced directives: (Declined) Advance directive discussed with you today. Even though you declined this today, please call our office should you change your mind, and we can give you the proper paperwork for you to fill out. Advance Care Planning is important because it:  [x]  Makes sure you receive the medical care that is consistent with your values, goals, and preferences  [x]  It provides guidance to your family and loved ones and it also reduces their decisional burden  about whether or not they are making the right decisions based on what you want done  Follow the link provided in your after visit summary or read over the paperwork we have mailed to you to help you started getting your Advance Directives in place. If you need assistance in completing these, please reach out to [place] so that we can help you!   Next Medicare Annual Wellness Visit scheduled for next year: yes  Understanding Your Risk for Falls Millions of people have serious injuries from falls each year. It is important to understand your risk of falling. Talk with your health care provider about your risk and what you can do to lower it. If you do have a serious fall, make sure to tell your provider. Falling once raises your risk of falling again. How can falls affect me? Serious injuries from falls are common. These include: Broken bones, such as hip fractures. Head injuries, such as traumatic brain injuries (TBI) or concussions. A fear of falling can cause you to avoid activities and stay at home. This can make your muscles weaker and raise your risk for a fall. What can increase my risk? There are a number of risk factors that increase your risk for falling. The more risk factors you have, the higher your risk of falling. Serious injuries from a fall happen most often to people who are older than 78 years old. Teenagers and young adults ages 23-29 are also at  higher risk. Common risk factors include: Weakness in the lower body. Being generally weak or confused due to long-term (chronic) illness. Dizziness or balance problems. Poor vision. Medicines that cause dizziness or drowsiness. These may include: Medicines for your blood pressure, heart, anxiety, insomnia, or swelling (edema). Pain medicines. Muscle relaxants. Other risk factors include: Drinking alcohol. Having had a fall in the past. Having foot pain or wearing improper footwear. Working at a dangerous job. Having any of the  following in your home: Tripping hazards, such as floor clutter or loose rugs. Poor lighting. Pets. Having dementia or memory loss. What actions can I take to lower my risk of falling?     Physical activity Stay physically fit. Do strength and balance exercises. Consider taking a regular class to build strength and balance. Yoga and tai chi are good options. Vision Have your eyes checked every year and your prescription for glasses or contacts updated as needed. Shoes and walking aids Wear non-skid shoes. Wear shoes that have rubber soles and low heels. Do not wear high heels. Do not walk around the house in socks or slippers. Use a cane or walker as told by your provider. Home safety Attach secure railings on both sides of your stairs. Install grab bars for your bathtub, shower, and toilet. Use a non-skid mat in your bathtub or shower. Attach bath mats securely with double-sided, non-slip rug tape. Use good lighting in all rooms. Keep a flashlight near your bed. Make sure there is a clear path from your bed to the bathroom. Use night-lights. Do not use throw rugs. Make sure all carpeting is taped or tacked down securely. Remove all clutter from walkways and stairways, including extension cords. Repair uneven or broken steps and floors. Avoid walking on icy or slippery surfaces. Walk on the grass instead of on icy or slick sidewalks. Use ice melter to get rid of ice on walkways in the winter. Use a cordless phone. Questions to ask your health care provider Can you help me check my risk for a fall? Do any of my medicines make me more likely to fall? Should I take a vitamin D supplement? What exercises can I do to improve my strength and balance? Should I make an appointment to have my vision checked? Do I need a bone density test to check for weak bones (osteoporosis)? Would it help to use a cane or a walker? Where to find more information Centers for Disease Control and  Prevention, STEADI: TonerPromos.no Community-Based Fall Prevention Programs: TonerPromos.no [employer] on Aging: BaseRingTones.pl Contact a health care provider if: You fall at home. You are afraid of falling at home. You feel weak, drowsy, or dizzy. This information is not intended to replace advice given to you by your health care provider. Make sure you discuss any questions you have with your health care provider. Document Revised: 01/17/2022 Document Reviewed: 01/17/2022 Elsevier Patient Education  2024 [employer].

## 2023-09-28 ENCOUNTER — Encounter: Payer: Self-pay | Admitting: Family Medicine

## 2023-09-28 ENCOUNTER — Ambulatory Visit (INDEPENDENT_AMBULATORY_CARE_PROVIDER_SITE_OTHER): Admitting: Family Medicine

## 2023-09-28 VITALS — BP 134/82 | HR 82 | Resp 18 | Ht 64.5 in | Wt 152.1 lb

## 2023-09-28 DIAGNOSIS — M51361 Other intervertebral disc degeneration, lumbar region with lower extremity pain only: Secondary | ICD-10-CM

## 2023-09-28 DIAGNOSIS — Z981 Arthrodesis status: Secondary | ICD-10-CM

## 2023-09-28 DIAGNOSIS — R7301 Impaired fasting glucose: Secondary | ICD-10-CM

## 2023-09-28 DIAGNOSIS — E7849 Other hyperlipidemia: Secondary | ICD-10-CM | POA: Diagnosis not present

## 2023-09-28 DIAGNOSIS — E038 Other specified hypothyroidism: Secondary | ICD-10-CM

## 2023-09-28 DIAGNOSIS — E559 Vitamin D deficiency, unspecified: Secondary | ICD-10-CM | POA: Diagnosis not present

## 2023-09-28 DIAGNOSIS — M48061 Spinal stenosis, lumbar region without neurogenic claudication: Secondary | ICD-10-CM
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
## 2023-09-28 NOTE — Patient Instructions (Addendum)
 I appreciate the opportunity to provide care to you today!    Follow up:  4 months  Labs: please stop by the lab during the week to get your blood drawn (CBC, CMP, TSH, Lipid profile, HgA1c, Vit D)  Here are some nonpharmacological interventions to manage low back pain: Strengthening Exercises: Focus on exercises that strengthen the core muscles, including the abdominal, back, and pelvic muscles. Activities like bridging, planks, and leg raises can be beneficial. -Stretching: Incorporate stretches that target the lower back, hamstrings, and hip flexors. Examples include the child's pose, cat-cow stretch, and hamstring stretches. -Ergonomics: Ensure that your workstation is ergonomically designed. Use a chair that supports the lower back, and maintain proper posture while sitting and standing. -Body Mechanics: Practice proper lifting techniques by bending at the knees rather than the waist and avoiding twisting motions while lifting. -Heat Therapy: Apply a warm compress, heating pad, or take a warm bath to relax tense muscles and increase blood flow to the affected area. -Cold Therapy: Use an ice pack or cold compress to reduce inflammation and numb the area if the pain is due to an acute injury or inflammation. -Massage Therapy: Consider gentle massage techniques to alleviate muscle tension and improve circulation. -Weight Management: Maintain a healthy weight to reduce stress on the lower back. -Regular Physical Activity: Incorporate low-impact activities such as walking, swimming, or cycling to improve overall fitness and back health. -Proper Support: Use a supportive mattress and consider sleeping positions that support the natural curve of the spine. Sleeping on your back with a pillow under your knees or on your side with a pillow between your knees may provide relief   Referrals today-  Orthopedic surgery in [place]  Attached with your AVS, you will find valuable resources for self-education.  I highly recommend dedicating some time to thoroughly examine them.   Please continue to a heart-healthy diet and increase your physical activities. Try to exercise for at least five days a week.    It was a pleasure to see you and I look forward to continuing to work together on your health and well-being. Please do not hesitate to call the office if you need care or have questions about your care.  In case of emergency, please visit the Emergency Department for urgent care, or contact our clinic at [phone] to schedule an appointment. We're here to help you!   Have a wonderful day and week. With Gratitude, Alisi Lupien MSN, FNP-BC

## 2023-09-29 LAB — CMP14+EGFR
ALT: 27 IU/L (ref 0–32)
AST: 35 IU/L (ref 0–40)
Albumin: 4.5 g/dL (ref 3.8–4.8)
Alkaline Phosphatase: 66 IU/L (ref 44–121)
BUN/Creatinine Ratio: 11 — ABNORMAL LOW (ref 12–28)
BUN: 9 mg/dL (ref 8–27)
Bilirubin Total: 0.6 mg/dL (ref 0.0–1.2)
CO2: 23 mmol/L (ref 20–29)
Calcium: 10.6 mg/dL — ABNORMAL HIGH (ref 8.7–10.3)
Chloride: 102 mmol/L (ref 96–106)
Creatinine, Ser: 0.83 mg/dL (ref 0.57–1.00)
Globulin, Total: 3.2 g/dL (ref 1.5–4.5)
Glucose: 92 mg/dL (ref 70–99)
Potassium: 3.9 mmol/L (ref 3.5–5.2)
Sodium: 142 mmol/L (ref 134–144)
Total Protein: 7.7 g/dL (ref 6.0–8.5)
eGFR: 73 mL/min/{1.73_m2} (ref >59)

## 2023-09-29 LAB — LIPID PANEL
Chol/HDL Ratio: 2 ratio (ref 0.0–4.4)
Cholesterol, Total: 198 mg/dL (ref 100–199)
HDL: 100 mg/dL (ref >39)
LDL Chol Calc (NIH): 76 mg/dL (ref 0–99)
Triglycerides: 135 mg/dL (ref 0–149)
VLDL Cholesterol Cal: 22 mg/dL (ref 5–40)

## 2023-09-29 LAB — CBC WITH DIFFERENTIAL/PLATELET
Basophils Absolute: 0.1 10*3/uL (ref 0.0–0.2)
Basos: 1 %
EOS (ABSOLUTE): 0.1 10*3/uL (ref 0.0–0.4)
Eos: 1 %
Hematocrit: 40.6 % (ref 34.0–46.6)
Hemoglobin: 13.4 g/dL (ref 11.1–15.9)
Immature Grans (Abs): 0 10*3/uL (ref 0.0–0.1)
Immature Granulocytes: 0 %
Lymphocytes Absolute: 1.9 10*3/uL (ref 0.7–3.1)
Lymphs: 30 %
MCH: 28.9 pg (ref 26.6–33.0)
MCHC: 33 g/dL (ref 31.5–35.7)
MCV: 88 fL (ref 79–97)
Monocytes Absolute: 0.7 10*3/uL (ref 0.1–0.9)
Monocytes: 10 %
Neutrophils Absolute: 3.9 10*3/uL (ref 1.4–7.0)
Neutrophils: 58 %
Platelets: 230 10*3/uL (ref 150–450)
RBC: 4.64 x10E6/uL (ref 3.77–5.28)
RDW: 13.2 % (ref 11.7–15.4)
WBC: 6.6 10*3/uL (ref 3.4–10.8)

## 2023-09-29 LAB — HEMOGLOBIN A1C
Est. average glucose Bld gHb Est-mCnc: 105 mg/dL
Hgb A1c MFr Bld: 5.3 % (ref 4.8–5.6)

## 2023-09-29 LAB — TSH+FREE T4
Free T4: 1.16 ng/dL (ref 0.82–1.77)
TSH: 0.911 u[IU]/mL (ref 0.450–4.500)

## 2023-09-29 LAB — VITAMIN D 25 HYDROXY (VIT D DEFICIENCY, FRACTURES): Vit D, 25-Hydroxy: 56.9 ng/mL (ref 30.0–100.0)

## 2023-09-29 NOTE — Progress Notes (Signed)
 Please inform the patient that her labs are stable

## 2023-09-30 NOTE — Assessment & Plan Note (Addendum)
 Referral placed to orthopedic surgery in [place]. The patient declines physical therapy at this time. Encouraged to continue nonpharmacological interventions, including application of heat/cold therapy, avoidance of aggravating activities, and continued use of Extra Strength Tylenol  as needed for pain relief.

## 2023-09-30 NOTE — Assessment & Plan Note (Signed)
 The patient was encouraged to continue taking atorvastatin  10 mg daily for cholesterol management. Lifestyle modifications were also discussed, including avoiding simple carbohydrates such as cakes, sweet desserts, ice cream, soda (diet or regular), sweet tea, candies, chips, cookies, store-bought juices, excessive alcohol (more than 1-2 drinks per day), lemonade, artificial sweeteners, donuts, coffee creamers, and sugar-free products. Additionally, the patient was advised to reduce the consumption of greasy, fatty foods and increase physical activity to support cardiovascular health. The patient verbalized understanding and is aware of the plan of care.  Lab Results  Component Value Date   CHOL 198 09/28/2023   HDL 100 09/28/2023   LDLCALC 76 09/28/2023   TRIG 135 09/28/2023   CHOLHDL 2.0 09/28/2023

## 2023-09-30 NOTE — Assessment & Plan Note (Signed)
 Encouraged to increase his intake of vitamin D-rich foods such as fatty fish (e.g., salmon, mackerel, and sardines), fortified dairy products, egg yolks, and fortified cereals.

## 2023-10-09 ENCOUNTER — Other Ambulatory Visit: Payer: Self-pay

## 2023-10-09 DIAGNOSIS — Z122 Encounter for screening for malignant neoplasm of respiratory organs: Secondary | ICD-10-CM

## 2023-10-09 DIAGNOSIS — Z87891 Personal history of nicotine dependence: Secondary | ICD-10-CM

## 2023-10-11 ENCOUNTER — Telehealth: Payer: Self-pay | Admitting: Family Medicine

## 2023-10-11 NOTE — Telephone Encounter (Signed)
 Copied from CRM [id number]. Topic: General - Other >> Oct 11, 2023  3:17 PM Jeris Montes S wrote: Reason for CRM: Pt called stated when she was in on May 1st- Dr Zawolo advised she would schedule the patient for a full body MRI. The patient also stated she forgot to let PCP know she needs a colonoscopy for this year. Her last one was 05/30/22. Please reach out to pt when scheduled ..[phone]

## 2023-10-13 ENCOUNTER — Ambulatory Visit: Payer: Self-pay

## 2023-10-13 NOTE — Telephone Encounter (Signed)
 Pt needs update on MRI.    Chief Complaint: Pt. Asking if her PCP has ordered her MRI. Please advise pt. Symptoms:  Frequency:  Pertinent Negatives: Patient denies  Disposition: [] ED /[] Urgent Care (no appt availability in office) / [] Appointment(In office/virtual)/ []  [facility]/ [] Home Care/ [] Refused Recommended Disposition /[] [facility]/ [x]  Follow-up with PCP Additional Notes: Please advise pt.  Reason for Disposition  [1] Caller requesting NON-URGENT health information AND [2] PCP's office is the best resource  Answer Assessment - Initial Assessment Questions 1. REASON FOR CALL or QUESTION: "What is your reason for calling today?" or "How can I best help you?" or "What question do you have that I can help answer?"     Patient asking if PCP has ordered her MRI yet.  Protocols used: Information Only Call - No Triage-A-AH

## 2023-10-14 ENCOUNTER — Other Ambulatory Visit: Payer: Self-pay | Admitting: Family Medicine

## 2023-10-16 ENCOUNTER — Other Ambulatory Visit: Payer: Self-pay | Admitting: Family Medicine

## 2023-10-16 DIAGNOSIS — Z1211 Encounter for screening for malignant neoplasm of colon: Secondary | ICD-10-CM

## 2023-10-16 NOTE — Telephone Encounter (Signed)
 Please inform the patient that a referral has been placed to GI for colon cancer screening

## 2023-10-16 NOTE — Telephone Encounter (Signed)
 Encourage to follow up with orthopedic

## 2023-10-18 ENCOUNTER — Encounter (INDEPENDENT_AMBULATORY_CARE_PROVIDER_SITE_OTHER): Payer: Self-pay | Admitting: *Deleted

## 2023-10-25 ENCOUNTER — Other Ambulatory Visit (INDEPENDENT_AMBULATORY_CARE_PROVIDER_SITE_OTHER)

## 2023-10-25 ENCOUNTER — Encounter: Payer: Self-pay | Admitting: Surgical

## 2023-10-25 ENCOUNTER — Ambulatory Visit (INDEPENDENT_AMBULATORY_CARE_PROVIDER_SITE_OTHER): Admitting: Surgical

## 2023-10-25 DIAGNOSIS — M65341 Trigger finger, right ring finger: Secondary | ICD-10-CM | POA: Diagnosis not present

## 2023-10-25 DIAGNOSIS — M5416 Radiculopathy, lumbar region: Secondary | ICD-10-CM
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
## 2023-10-26 DIAGNOSIS — H00021 Hordeolum internum right upper eyelid: Secondary | ICD-10-CM | POA: Diagnosis not present

## 2023-11-04 ENCOUNTER — Emergency Department (HOSPITAL_COMMUNITY)

## 2023-11-04 ENCOUNTER — Other Ambulatory Visit: Payer: Self-pay

## 2023-11-04 ENCOUNTER — Emergency Department (HOSPITAL_COMMUNITY)
Admission: EM | Admit: 2023-11-04 | Discharge: 2023-11-04 | Disposition: A | Discharge status: Home / Self Care | Attending: Emergency Medicine | Admitting: Emergency Medicine

## 2023-11-04 ENCOUNTER — Encounter (HOSPITAL_COMMUNITY): Payer: Self-pay | Admitting: *Deleted

## 2023-11-04 DIAGNOSIS — W19XXXA Unspecified fall, initial encounter: Secondary | ICD-10-CM

## 2023-11-04 DIAGNOSIS — Z79899 Other long term (current) drug therapy: Secondary | ICD-10-CM | POA: Diagnosis not present

## 2023-11-04 DIAGNOSIS — W010XXA Fall on same level from slipping, tripping and stumbling without subsequent striking against object, initial encounter: Secondary | ICD-10-CM | POA: Diagnosis not present

## 2023-11-04 DIAGNOSIS — S92351A Displaced fracture of fifth metatarsal bone, right foot, initial encounter for closed fracture: Secondary | ICD-10-CM | POA: Insufficient documentation

## 2023-11-04 DIAGNOSIS — I10 Essential (primary) hypertension: Secondary | ICD-10-CM | POA: Diagnosis not present

## 2023-11-04 DIAGNOSIS — S5012XA Contusion of left forearm, initial encounter: Secondary | ICD-10-CM | POA: Diagnosis not present

## 2023-11-04 DIAGNOSIS — T148XXA Other injury of unspecified body region, initial encounter: Secondary | ICD-10-CM

## 2023-11-04 DIAGNOSIS — M79632 Pain in left forearm: Secondary | ICD-10-CM | POA: Diagnosis not present

## 2023-11-04 DIAGNOSIS — S99921A Unspecified injury of right foot, initial encounter: Secondary | ICD-10-CM | POA: Diagnosis present

## 2023-11-04 MED ORDER — DOCUSATE SODIUM 100 MG PO CAPS: 100.0000 mg | ORAL_CAPSULE | Freq: Two times a day (BID) | ORAL | 0 refills | Status: DC

## 2023-11-04 MED ORDER — BACITRACIN ZINC 500 UNIT/GM EX OINT
TOPICAL_OINTMENT | Freq: Two times a day (BID) | CUTANEOUS | Status: DC
  Filled 2023-11-04: qty 1.8

## 2023-11-04 MED ORDER — HYDROCODONE-ACETAMINOPHEN 5-325 MG PO TABS: 1.0000 | ORAL_TABLET | Freq: Four times a day (QID) | ORAL | 0 refills | Status: DC | PRN

## 2023-11-04 NOTE — Discharge Instructions (Addendum)
 You will need to wear the cam walker at all times to protect your fractured bone in your foot.  Use the crutches to avoid weightbearing on this foot.  You have also been prescribed a wheelchair if you find the crutches are too difficult to use.  Ice and elevation will help with pain and swelling.  I recommend gentle soap and water wash of your forearm abrasion followed by a new application of antibiotic ointment and dressing, continue doing this until this wound is completely dried over with the scab.  Follow-up with your orthopedist for follow-up care of this injury.  We have given you the name of a local orthopedist if you prefer to keep your care in [place].  You have been prescribed a small quantity of pain medication, use caution of this medication as it can make you drowsy.  Do not drive within 4 hours of taking this medicine.  It can also cause constipation so be aware of that, you may want to consider taking a stool softener while on this medication.

## 2023-11-04 NOTE — ED Provider Notes (Signed)
  EMERGENCY DEPARTMENT AT [facility] Provider Note   CSN: [id number] Arrival date & time: 11/04/23  1833     History  Chief Complaint  Patient presents with   Rachel [name] is a 78 y.o. female with a history including hypertension, carpal tunnel syndrome, history of aortic aneurysm, is not on anticoagulants, presenting for injury sustained in a fall prior to arrival.  She was attending her nephew's birthday party when she slipped going down a step, landed forward on her outstretched left forearm sustaining an abrasion and swelling, also has pain and swelling at her right lateral foot.  She denies any other complaints of pain or injury, she did not hit her head, denies knee pain, hip pain, back or neck pain.  Her left forearm abrasion was wrapped in dressings prior to arrival.  She is current with her tetanus vaccinations.  The history is provided by the patient.       Home Medications Prior to Admission medications   Medication Sig Start Date End Date Taking? Authorizing Provider  docusate sodium  (COLACE) 100 MG capsule Take 1 capsule (100 mg total) by mouth every 12 (twelve) hours. 11/04/23  Yes Dorathy Stallone, PA-C  HYDROcodone -acetaminophen  (NORCO/VICODIN) 5-325 MG tablet Take 1 tablet by mouth every 6 (six) hours as needed for severe pain (pain score 7-10). 11/04/23  Yes Jeovanni Heuring, Concha Deed, PA-C  albuterol  (VENTOLIN  HFA) 108 (90 Base) MCG/ACT inhaler Inhale 2 puffs into the lungs every 6 (six) hours as needed for wheezing or shortness of breath. 04/14/21   Prudy Brownie, DO  amLODipine  (NORVASC ) 5 MG tablet TAKE 1 TABLET BY MOUTH DAILY 10/16/23   Zarwolo, Gloria, FNP  Ascorbic Acid (VITA-C PO) Take 1 tablet by mouth daily.    [provider]  atorvastatin  (LIPITOR) 10 MG tablet TAKE 1 TABLET BY MOUTH DAILY 04/05/23   Zarwolo, Gloria, FNP  carboxymethylcellulose (REFRESH PLUS) 0.5 % SOLN Place 1 drop into both eyes in the morning and at bedtime.    [provider]  famotidine  (PEPCID ) 20 MG tablet TAKE 1 TABLET BY MOUTH TWICE A DAY 08/11/20   Austine Lefort, MD  ibuprofen (ADVIL) 800 MG tablet Take 800 mg by mouth every 8 (eight) hours as needed. 09/14/22   [provider]  lisinopril  (ZESTRIL ) 10 MG tablet TAKE 1 TABLET BY MOUTH DAILY 04/05/23   Zarwolo, Gloria, FNP  meclizine (ANTIVERT) 25 MG tablet Take 25 mg by mouth 3 (three) times daily as needed for dizziness.    [provider]  pantoprazole  (PROTONIX ) 40 MG tablet TAKE 1 TABLET BY MOUTH DAILY 05/03/23   Zarwolo, Gloria, FNP  propranolol  ER (INDERAL  LA) 60 MG 24 hr capsule TAKE 1 CAPSULE BY MOUTH DAILY  FOR BLOOD PRESSURE 05/03/23   Zarwolo, Gloria, FNP  Tiotropium Bromide-Olodaterol (STIOLTO RESPIMAT ) 2.5-2.5 MCG/ACT AERS Inhale 2 puffs into the lungs daily. 06/16/21   Cobb, Mariah Shines, NP  vitamin E 180 MG (400 UNITS) capsule Take 400 Units by mouth daily.    [provider]      Allergies    Patient has no known allergies.    Review of Systems   Review of Systems  Constitutional: Negative.   HENT: Negative.    Eyes: Negative.   Respiratory:  Negative for chest tightness and shortness of breath.   Cardiovascular:  Negative for chest pain.  Gastrointestinal:  Negative for abdominal pain and nausea.  Genitourinary: Negative.   Musculoskeletal:  Positive  for arthralgias. Negative for joint swelling and neck pain.  Skin:  Positive for wound. Negative for rash.  Neurological:  Negative for dizziness, weakness, light-headedness, numbness and headaches.  Psychiatric/Behavioral: Negative.    All other systems reviewed and are negative.   Physical Exam Updated Vital Signs BP (!) 157/95 (BP Location: Left Arm)   Pulse 99   Temp 98.5 F (36.9 C) (Oral)   Resp 15   Ht 5' 4.5" (1.638 m)   Wt 67.6 kg   SpO2 95%   BMI 25.18 kg/m  Physical Exam Vitals and nursing note reviewed.  Constitutional:      Appearance: She is well-developed.  HENT:      Head: Normocephalic and atraumatic.  Eyes:     Conjunctiva/sclera: Conjunctivae normal.  Cardiovascular:     Rate and Rhythm: Normal rate.     Heart sounds: Normal heart sounds.  Pulmonary:     Effort: Pulmonary effort is normal.     Breath sounds: Normal breath sounds.  Musculoskeletal:        General: Swelling and tenderness present. Normal range of motion.     Left forearm: Swelling and tenderness present.     Cervical back: Normal range of motion.     Right foot: Normal range of motion and normal capillary refill. Swelling and bony tenderness present. No deformity. Normal pulse.     Comments: Ttp with edema right lateral foot along 5th metatarsal,  no deformity.  Skin:    General: Skin is warm and dry.     Findings: Rash present.     Comments: Abrasion volar left forearm,  hemostatic.  Hematoma noted.  Neurological:     Mental Status: She is alert.     ED Results / Procedures / Treatments   Labs (all labs ordered are listed, but only abnormal results are displayed) Labs Reviewed - No data to display  EKG None  Radiology DG Foot Complete Right Result Date: 11/04/2023 CLINICAL DATA:  Recent slip and fall with right foot pain, initial encounter EXAM: RIGHT FOOT COMPLETE - 3+ VIEW COMPARISON:  None Available. FINDINGS: Mildly displaced fracture through the base of the fifth metatarsal is seen. No other bony abnormality is noted. No soft tissue abnormality is noted. IMPRESSION: Fracture through the base of the fifth metatarsal. Electronically Signed   By: Violeta Grey M.D.   On: 11/04/2023 19:48   DG Forearm Left Result Date: 11/04/2023 CLINICAL DATA:  Recent slip and fall with left forearm pain, initial encounter EXAM: LEFT FOREARM - 2 VIEW COMPARISON:  None Available. FINDINGS: No acute fracture or dislocation is noted. Mild soft tissue swelling is noted in the mid forearm consistent with the recent injury. IMPRESSION: Mild soft tissue swelling without acute bony abnormality.  Electronically Signed   By: Violeta Grey M.D.   On: 11/04/2023 19:47    Procedures Procedures    Wound care was provided by me, bacitracin ointment was applied after wound cleaner spray, nonstick dressings and [place] applied.  Medications Ordered in ED Medications  bacitracin ointment (has no administration in time range)    ED Course/ Medical Decision Making/ A&P                                 Medical Decision Making Patient presenting with a trip and fall occurring prior to arrival with injuries localizing to the right lateral foot and her left forearm including abrasion and hematoma over  the forearm.  No head injury, no anticoagulant use.  No complaint of neck back pain.  No hip pain.  Imaging results per below.  She was placed in a cam walker, instructed that she needs to be nonweightbearing, she was provided crutches and a hand prescription for wheelchair if she does not tolerate the crutches well.  Discussed home treatment including ice and elevation, antibiotic ointment to her abrasion.  Of note she is current with her tetanus vaccine.  Referral given to orthopedics for follow-up.  Amount and/or Complexity of Data Reviewed Radiology: ordered.    Details: Imaging reviewed, left forearm negative for acute fracture or dislocation.  Right foot significant for a proximal fifth metatarsal fracture, mildly displaced.  Risk OTC drugs. Prescription drug management.           Final Clinical Impression(s) / ED Diagnoses Final diagnoses:  Fall, initial encounter  Closed displaced fracture of fifth metatarsal bone of right foot, initial encounter  Hematoma of left forearm  Abrasion    Rx / DC Orders ED Discharge Orders          Ordered    HYDROcodone -acetaminophen  (NORCO/VICODIN) 5-325 MG tablet  Every 6 hours PRN        11/04/23 2023    docusate sodium  (COLACE) 100 MG capsule  Every 12 hours        11/04/23 2023              Katherine Pancake, PA-C 11/04/23  2025    Early Glisson, MD 11/05/23 1455

## 2023-11-04 NOTE — ED Notes (Signed)
 Pt refused crutches.

## 2023-11-04 NOTE — ED Notes (Signed)
 Pt lying in bed with family at bedside. Pt is A&Ox4. Denies pain at this time. EDP at bedside at this time.

## 2023-11-04 NOTE — ED Triage Notes (Signed)
 Pt slipped from a step and fell, c/o pain to right foot and lac to left forearm. Denies hitting her head. Denies blood thinners.

## 2023-11-08 ENCOUNTER — Ambulatory Visit (INDEPENDENT_AMBULATORY_CARE_PROVIDER_SITE_OTHER): Admitting: Surgical

## 2023-11-08 DIAGNOSIS — S99921A Unspecified injury of right foot, initial encounter: Secondary | ICD-10-CM

## 2023-11-09 ENCOUNTER — Ambulatory Visit (HOSPITAL_COMMUNITY)
Admission: RE | Admit: 2023-11-09 | Discharge: 2023-11-09 | Disposition: A | Discharge status: Home / Self Care | Source: Ambulatory Visit | Attending: Surgical | Admitting: Surgical

## 2023-11-09 DIAGNOSIS — R600 Localized edema: Secondary | ICD-10-CM | POA: Diagnosis not present

## 2023-11-09 DIAGNOSIS — S99921A Unspecified injury of right foot, initial encounter: Secondary | ICD-10-CM | POA: Insufficient documentation

## 2023-11-09 DIAGNOSIS — M19071 Primary osteoarthritis, right ankle and foot: Secondary | ICD-10-CM | POA: Diagnosis not present

## 2023-11-09 DIAGNOSIS — S92354A Nondisplaced fracture of fifth metatarsal bone, right foot, initial encounter for closed fracture: Secondary | ICD-10-CM | POA: Diagnosis not present

## 2023-11-10 ENCOUNTER — Encounter: Payer: Self-pay | Admitting: Surgical
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
## 2023-11-16 ENCOUNTER — Ambulatory Visit: Admitting: Orthopedic Surgery

## 2023-11-16 DIAGNOSIS — S92354A Nondisplaced fracture of fifth metatarsal bone, right foot, initial encounter for closed fracture: Secondary | ICD-10-CM | POA: Diagnosis not present

## 2023-11-16 DIAGNOSIS — S99921A Unspecified injury of right foot, initial encounter: Secondary | ICD-10-CM

## 2023-11-17 ENCOUNTER — Telehealth: Payer: Self-pay | Admitting: Orthopedic Surgery

## 2023-11-17 NOTE — Telephone Encounter (Signed)
 Patient called ask do you think a bone stimulator will help her heal faster. CB#[phone]

## 2023-11-17 NOTE — Telephone Encounter (Signed)
 I called pt and advised of message below. She verbalized understanding and will call with any other questions.

## 2023-11-17 NOTE — Telephone Encounter (Signed)
 You saw this pt in the office yesterday for a right foor 5th MT fx? She is asking if you think she needs a bone stimulator.

## 2023-11-20 ENCOUNTER — Telehealth: Payer: Self-pay | Admitting: *Deleted

## 2023-11-20 NOTE — Telephone Encounter (Signed)
 Pt called in requesting to pikc up her questionnaire as she is currently staying with daughter in [place]. Left [place] [address].

## 2023-11-21 ENCOUNTER — Encounter: Payer: Self-pay | Admitting: Orthopedic Surgery
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
## 2023-11-22 ENCOUNTER — Ambulatory Visit: Admitting: Surgical

## 2023-11-23 ENCOUNTER — Ambulatory Visit: Admitting: Physical Medicine and Rehabilitation

## 2023-11-23 ENCOUNTER — Other Ambulatory Visit: Payer: Self-pay

## 2023-11-23 VITALS — BP 147/101 | HR 86

## 2023-11-23 DIAGNOSIS — M5416 Radiculopathy, lumbar region: Secondary | ICD-10-CM

## 2023-11-23 MED ORDER — METHYLPREDNISOLONE ACETATE 40 MG/ML IJ SUSP
40.0000 mg | Freq: Once | INTRAMUSCULAR | Status: AC
  Administered 2023-11-23: 40 mg

## 2023-11-23 NOTE — Progress Notes (Signed)
 Rachel [name] - 78 y.o. female MRN [id number]  Date of birth: May 27, 1946  Office Visit Note: Visit Date: 11/23/2023 PCP: Zarwolo, Gloria, FNP Referred by: Zarwolo, Gloria, FNP  Subjective: Chief Complaint  Patient presents with   Lower Back - Pain   HPI:  Rachel [name] is a 78 y.o. female who comes in today at the request of Rachel Calix, PA-C for planned Bilateral L3-4 Lumbar Transforaminal epidural steroid injection with fluoroscopic guidance.  The patient has failed conservative care including home exercise, medications, time and activity modification.  This injection will be diagnostic and hopefully therapeutic.  Please see requesting physician notes for further details and justification.   ROS Otherwise per HPI.  Assessment & Plan: Visit Diagnoses:    ICD-10-CM   1. Radiculopathy, lumbar region  M54.16 XR C-ARM NO REPORT    Epidural Steroid injection    methylPREDNISolone acetate (DEPO-MEDROL) injection 40 mg      Plan: No additional findings.   Meds & Orders:  Meds ordered this encounter  Medications   methylPREDNISolone acetate (DEPO-MEDROL) injection 40 mg    Orders Placed This Encounter  Procedures   XR C-ARM NO REPORT   Epidural Steroid injection    Follow-up: Return if symptoms worsen or fail to improve.   Procedures: No procedures performed  Lumbosacral Transforaminal Epidural Steroid Injection - Sub-Pedicular Approach with Fluoroscopic Guidance  Patient: Rachel [name]      Date of Birth: 1946-04-19 MRN: [id number] PCP: Zarwolo, Gloria, FNP      Visit Date: 11/23/2023   Universal Protocol:    Date/Time: 11/23/2023  Consent Given By: the patient  Position: PRONE  Additional Comments: Vital signs were monitored before and after the procedure. Patient was prepped and draped in the usual sterile fashion. The correct patient, procedure, and site was verified.   Injection Procedure Details:   Procedure diagnoses: Radiculopathy, lumbar region  [M54.16]    Meds Administered:  Meds ordered this encounter  Medications   methylPREDNISolone acetate (DEPO-MEDROL) injection 40 mg    Laterality: Bilateral  Location/Site: L3  Needle:5.0 in., 22 ga.  Short bevel or Quincke spinal needle  Needle Placement: Transforaminal  Findings:    -Comments: Excellent flow of contrast along the nerve, nerve root and into the epidural space.  Procedure Details: After squaring off the end-plates to get a true AP view, the C-arm was positioned so that an oblique view of the foramen as noted above was visualized. The target area is just inferior to the nose of the scotty dog or sub pedicular. The soft tissues overlying this structure were infiltrated with 2-3 ml. of 1% Lidocaine  without Epinephrine.  The spinal needle was inserted toward the target using a trajectory view along the fluoroscope beam.  Under AP and lateral visualization, the needle was advanced so it did not puncture dura and was located close the 6 O'Clock position of the pedical in AP tracterory. Biplanar projections were used to confirm position. Aspiration was confirmed to be negative for CSF and/or blood. A 1-2 ml. volume of Isovue -250 was injected and flow of contrast was noted at each level. Radiographs were obtained for documentation purposes.   After attaining the desired flow of contrast documented above, a 0.5 to 1.0 ml test dose of 0.25% Marcaine  was injected into each respective transforaminal space.  The patient was observed for 90 seconds post injection.  After no sensory deficits were reported, and normal lower extremity motor function was noted,   the above injectate was  administered so that equal amounts of the injectate were placed at each foramen (level) into the transforaminal epidural space.   Additional Comments:  The patient tolerated the procedure well Dressing: 2 x 2 sterile gauze and Band-Aid    Post-procedure details: Patient was observed during the  procedure. Post-procedure instructions were reviewed.  Patient left the clinic in stable condition.    Clinical History: MRI LUMBAR SPINE WITHOUT CONTRAST   TECHNIQUE: Multiplanar, multisequence MR imaging of the lumbar spine was performed. No intravenous contrast was administered.   COMPARISON:  07/19/2018   FINDINGS: Segmentation:  Standard.   Alignment:  Stable grade 1 anterolisthesis at L4-L5.   Vertebrae: Stable vertebral body heights. New postoperative changes at L4-L5 with rods and pedicle screws, interbody spacer, and posterior decompression. The hardware is not well evaluated on this study and there is associated susceptibility artifact. No substantial marrow edema. No suspicious osseous lesion.   Conus medullaris and cauda equina: Conus extends to the L1 level. Conus and cauda equina appear normal.   Paraspinal and other soft tissues: New postoperative changes.   Disc levels:   L1-L2:  Mild facet arthropathy.  No canal or foraminal stenosis.   L2-L3: Minimal disc bulge. Mild facet arthropathy. No canal or foraminal stenosis.   L3-L4: Disc bulge with superimposed left foraminal protrusion. Moderate facet arthropathy with ligamentum flavum infolding. Increased moderate canal stenosis. Slight effacement of the subarticular recesses. Similar mild right and mild to moderate left foraminal stenosis.   L4-L5: Operative level. Facet arthropathy. Canal and subarticular recesses are now decompressed. Foraminal stenosis significantly improved.   L5-S1: Minimal disc bulge. Marked right and mild left facet arthropathy with ligamentum flavum infolding. No canal or foraminal stenosis.   IMPRESSION: Degenerative and interval postoperative changes as detailed above. Significantly improved stenosis at L4-L5. Increased moderate canal stenosis at L3-L4.     Electronically Signed   By: Santina Blanch M.D.   On: 08/10/2020 21:08     Objective:  VS:  HT:    WT:    BMI:     BP:(!) 147/101  HR:86bpm  TEMP: ( )  RESP:  Physical Exam Vitals and nursing note reviewed.  Constitutional:      General: She is not in acute distress.    Appearance: Normal appearance. She is not ill-appearing.  HENT:     Head: Normocephalic and atraumatic.     Right Ear: External ear normal.     Left Ear: External ear normal.   Eyes:     Extraocular Movements: Extraocular movements intact.    Cardiovascular:     Rate and Rhythm: Normal rate.     Pulses: Normal pulses.  Pulmonary:     Effort: Pulmonary effort is normal. No respiratory distress.  Abdominal:     General: There is no distension.     Palpations: Abdomen is soft.   Musculoskeletal:        General: Tenderness and signs of injury present.     Cervical back: Neck supple.     Right lower leg: No edema.     Left lower leg: No edema.     Comments: Patient has good distal strength with no pain over the greater trochanters.  No clonus or focal weakness. Boot on right foot, left arm wrapped.   Skin:    Findings: No erythema, lesion or rash.   Neurological:     General: No focal deficit present.     Mental Status: She is alert and oriented to person, place,  and time.     Cranial Nerves: No cranial nerve deficit.     Sensory: No sensory deficit.     Motor: No weakness or abnormal muscle tone.     Coordination: Coordination normal.     Gait: Gait abnormal.   Psychiatric:        Mood and Affect: Mood normal.        Behavior: Behavior normal.      Imaging: No results found.

## 2023-11-23 NOTE — Procedures (Signed)
 Lumbosacral Transforaminal Epidural Steroid Injection - Sub-Pedicular Approach with Fluoroscopic Guidance  Patient: Rachel [name]      Date of Birth: 14-May-1946 MRN: [id number] PCP: Zarwolo, Gloria, FNP      Visit Date: 11/23/2023   Universal Protocol:    Date/Time: 11/23/2023  Consent Given By: the patient  Position: PRONE  Additional Comments: Vital signs were monitored before and after the procedure. Patient was prepped and draped in the usual sterile fashion. The correct patient, procedure, and site was verified.   Injection Procedure Details:   Procedure diagnoses: Radiculopathy, lumbar region [M54.16]    Meds Administered:  Meds ordered this encounter  Medications   methylPREDNISolone acetate (DEPO-MEDROL) injection 40 mg    Laterality: Bilateral  Location/Site: L3  Needle:5.0 in., 22 ga.  Short bevel or Quincke spinal needle  Needle Placement: Transforaminal  Findings:    -Comments: Excellent flow of contrast along the nerve, nerve root and into the epidural space.  Procedure Details: After squaring off the end-plates to get a true AP view, the C-arm was positioned so that an oblique view of the foramen as noted above was visualized. The target area is just inferior to the nose of the scotty dog or sub pedicular. The soft tissues overlying this structure were infiltrated with 2-3 ml. of 1% Lidocaine  without Epinephrine.  The spinal needle was inserted toward the target using a trajectory view along the fluoroscope beam.  Under AP and lateral visualization, the needle was advanced so it did not puncture dura and was located close the 6 O'Clock position of the pedical in AP tracterory. Biplanar projections were used to confirm position. Aspiration was confirmed to be negative for CSF and/or blood. A 1-2 ml. volume of Isovue -250 was injected and flow of contrast was noted at each level. Radiographs were obtained for documentation purposes.   After attaining the  desired flow of contrast documented above, a 0.5 to 1.0 ml test dose of 0.25% Marcaine  was injected into each respective transforaminal space.  The patient was observed for 90 seconds post injection.  After no sensory deficits were reported, and normal lower extremity motor function was noted,   the above injectate was administered so that equal amounts of the injectate were placed at each foramen (level) into the transforaminal epidural space.   Additional Comments:  The patient tolerated the procedure well Dressing: 2 x 2 sterile gauze and Band-Aid    Post-procedure details: Patient was observed during the procedure. Post-procedure instructions were reviewed.  Patient left the clinic in stable condition.

## 2023-11-23 NOTE — Patient Instructions (Signed)

## 2023-11-23 NOTE — Progress Notes (Signed)
 Pain Scale   Average Pain 7 Patient states her pain increases in her lower back when walking and doing daily chores. Patient advising her pain is less today due to not being able to do much due to a fx or left foot from a fall. However patient states she still has lower back pain        +Driver, -BT, -Dye Allergies.

## 2023-11-27 ENCOUNTER — Telehealth: Payer: Self-pay

## 2023-11-27 NOTE — Telephone Encounter (Signed)
 Who is your primary care physician: [facility]   Reasons for the colonoscopy: HX polyps  Have you had a colonoscopy before?  Yes 06/08/21 Dr.Castaneda  Do you have family history of colon cancer? no  Previous colonoscopy with polyps removed? yes  Do you have a history colorectal cancer?no    Are you diabetic? If yes, Type 1 or Type 2?    no  Do you have a prosthetic or mechanical heart valve? no  Do you have a pacemaker/defibrillator?   no  Have you had endocarditis/atrial fibrillation? no  Have you had joint replacement within the last 12 months?  no  Do you tend to be constipated or have to use laxatives? yes  Do you have any history of drugs or alchohol?  Yes occasional alcoholic drink  Do you use supplemental oxygen?  no  Have you had a stroke or heart attack within the last 6 months? no  Do you take weight loss medication?  no  For female patients: have you had a hysterectomy?  yes                                     are you post menopausal?       yes                                            do you still have your menstrual cycle? no      Do you take any blood-thinning medications such as: (aspirin, warfarin, Plavix, Aggrenox)  no  If yes we need the name, milligram, dosage and who is prescribing doctor  Current Outpatient Medications on File Prior to Visit  Medication Sig Dispense Refill   albuterol  (VENTOLIN  HFA) 108 (90 Base) MCG/ACT inhaler Inhale 2 puffs into the lungs every 6 (six) hours as needed for wheezing or shortness of breath. 8 g 6   amLODipine  (NORVASC ) 5 MG tablet TAKE 1 TABLET BY MOUTH DAILY 100 tablet 2   Ascorbic Acid (VITA-C PO) Take 1 tablet by mouth daily.     atorvastatin  (LIPITOR) 10 MG tablet TAKE 1 TABLET BY MOUTH DAILY 100 tablet 2   carboxymethylcellulose (REFRESH PLUS) 0.5 % SOLN Place 1 drop into both eyes in the morning and at bedtime.     docusate sodium  (COLACE) 100 MG capsule Take 1 capsule (100 mg total) by mouth every  12 (twelve) hours. 20 capsule 0   famotidine  (PEPCID ) 20 MG tablet TAKE 1 TABLET BY MOUTH TWICE A DAY 30 tablet 1   HYDROcodone -acetaminophen  (NORCO/VICODIN) 5-325 MG tablet Take 1 tablet by mouth every 6 (six) hours as needed for severe pain (pain score 7-10). 10 tablet 0   ibuprofen (ADVIL) 800 MG tablet Take 800 mg by mouth every 8 (eight) hours as needed.     lisinopril  (ZESTRIL ) 10 MG tablet TAKE 1 TABLET BY MOUTH DAILY 100 tablet 2   meclizine (ANTIVERT) 25 MG tablet Take 25 mg by mouth 3 (three) times daily as needed for dizziness.     pantoprazole  (PROTONIX ) 40 MG tablet TAKE 1 TABLET BY MOUTH DAILY 100 tablet 2   propranolol  ER (INDERAL  LA) 60 MG 24 hr capsule TAKE 1 CAPSULE BY MOUTH DAILY  FOR BLOOD PRESSURE 100 capsule 2   Tiotropium Bromide-Olodaterol (STIOLTO RESPIMAT ) 2.5-2.5 MCG/ACT  AERS Inhale 2 puffs into the lungs daily. 4 g 0   vitamin E 180 MG (400 UNITS) capsule Take 400 Units by mouth daily.     No current facility-administered medications on file prior to visit.    No Known Allergies   Pharmacy: [facility]   Primary Insurance Name: [facility] [id number]  Best number where you can be reached: [phone]

## 2023-11-28 MED ORDER — PEG 3350-KCL-NA BICARB-NACL 420 G PO SOLR: 4000.0000 mL | Freq: Once | ORAL | 0 refills | Status: AC

## 2023-11-28 NOTE — Telephone Encounter (Signed)
Room 1 , 2 day prep Thanks 

## 2023-11-28 NOTE — Addendum Note (Signed)
 Addended by: JEANELL GRAEME RAMAN on: 11/28/2023 03:10 PM   Modules accepted: Orders

## 2023-11-28 NOTE — Telephone Encounter (Signed)
 CALLED PT. Scheduled for 7/16. Aware will send instructions to her in the mail. Rx for prep sent to pharmacy.

## 2023-11-29 ENCOUNTER — Encounter (INDEPENDENT_AMBULATORY_CARE_PROVIDER_SITE_OTHER): Payer: Self-pay | Admitting: *Deleted

## 2023-11-29 NOTE — Telephone Encounter (Signed)
 Referral completed, [facility] apt letter sent to PCP

## 2023-11-29 NOTE — Telephone Encounter (Signed)
 Pt has cancelled her procedure due to scheduling conflict. She is wanting an afternoon appt due to transportation issues. Will call her back once we have an afternoon appt.

## 2023-11-29 NOTE — Telephone Encounter (Signed)
 LMOVM to return call  Pt left vm stating she had another appointment on 12/13/23 and needed to reschedule.

## 2023-12-12 ENCOUNTER — Encounter: Payer: Self-pay | Admitting: *Deleted

## 2023-12-12 NOTE — Telephone Encounter (Signed)
 Pt has been rescheduled for the afternoon on 12/29/23. Updated instructions mailed to pt

## 2023-12-13 ENCOUNTER — Ambulatory Visit (HOSPITAL_COMMUNITY): Admit: 2023-12-13 | Admitting: Gastroenterology

## 2023-12-13 ENCOUNTER — Ambulatory Visit (INDEPENDENT_AMBULATORY_CARE_PROVIDER_SITE_OTHER): Admitting: Surgical

## 2023-12-13 ENCOUNTER — Encounter: Payer: Self-pay | Admitting: Surgical

## 2023-12-13 ENCOUNTER — Encounter (HOSPITAL_COMMUNITY): Payer: Self-pay

## 2023-12-13 DIAGNOSIS — M5416 Radiculopathy, lumbar region: Secondary | ICD-10-CM

## 2023-12-13 SURGERY — COLONOSCOPY
Anesthesia: Choice
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
## 2023-12-15 NOTE — Telephone Encounter (Signed)
 LMOVM to return call  Pt had left vm about upcoming procedure.

## 2023-12-15 NOTE — Telephone Encounter (Signed)
 Pt wanted to know if she could go back home after procedure. Advised pt that she will need someone with her on the day of procedure and to stay with her after procedure. Pt verbalized understanding.

## 2023-12-16 ENCOUNTER — Other Ambulatory Visit: Payer: Self-pay | Admitting: Family Medicine

## 2023-12-18 ENCOUNTER — Ambulatory Visit (INDEPENDENT_AMBULATORY_CARE_PROVIDER_SITE_OTHER): Admitting: Orthopedic Surgery

## 2023-12-18 DIAGNOSIS — S92354A Nondisplaced fracture of fifth metatarsal bone, right foot, initial encounter for closed fracture: Secondary | ICD-10-CM | POA: Diagnosis not present

## 2023-12-18 DIAGNOSIS — S99921A Unspecified injury of right foot, initial encounter: Secondary | ICD-10-CM | POA: Diagnosis not present

## 2023-12-19 ENCOUNTER — Encounter: Payer: Self-pay | Admitting: Orthopedic Surgery
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
## 2023-12-20 ENCOUNTER — Encounter: Admitting: Physician Assistant
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
## 2023-12-29 ENCOUNTER — Encounter (HOSPITAL_COMMUNITY): Payer: Self-pay | Admitting: Gastroenterology

## 2023-12-29 ENCOUNTER — Encounter (HOSPITAL_COMMUNITY)
Admission: RE | Disposition: A | Discharge status: Home / Self Care | Payer: Self-pay | Source: Home / Self Care | Attending: Gastroenterology

## 2023-12-29 ENCOUNTER — Ambulatory Visit (HOSPITAL_BASED_OUTPATIENT_CLINIC_OR_DEPARTMENT_OTHER): Admitting: Anesthesiology

## 2023-12-29 ENCOUNTER — Ambulatory Visit (HOSPITAL_COMMUNITY)
Admission: RE | Admit: 2023-12-29 | Discharge: 2023-12-29 | Disposition: A | Discharge status: Home / Self Care | Attending: Gastroenterology | Admitting: Gastroenterology

## 2023-12-29 ENCOUNTER — Other Ambulatory Visit: Payer: Self-pay

## 2023-12-29 ENCOUNTER — Ambulatory Visit (HOSPITAL_COMMUNITY): Admitting: Anesthesiology

## 2023-12-29 DIAGNOSIS — Z1211 Encounter for screening for malignant neoplasm of colon: Secondary | ICD-10-CM | POA: Insufficient documentation

## 2023-12-29 DIAGNOSIS — K5730 Diverticulosis of large intestine without perforation or abscess without bleeding: Secondary | ICD-10-CM

## 2023-12-29 DIAGNOSIS — Z87891 Personal history of nicotine dependence: Secondary | ICD-10-CM | POA: Insufficient documentation

## 2023-12-29 DIAGNOSIS — Z860101 Personal history of adenomatous and serrated colon polyps: Secondary | ICD-10-CM

## 2023-12-29 DIAGNOSIS — D120 Benign neoplasm of cecum: Secondary | ICD-10-CM | POA: Insufficient documentation

## 2023-12-29 DIAGNOSIS — I719 Aortic aneurysm of unspecified site, without rupture: Secondary | ICD-10-CM | POA: Diagnosis not present

## 2023-12-29 DIAGNOSIS — D122 Benign neoplasm of ascending colon: Secondary | ICD-10-CM | POA: Insufficient documentation

## 2023-12-29 DIAGNOSIS — I10 Essential (primary) hypertension: Secondary | ICD-10-CM | POA: Insufficient documentation

## 2023-12-29 DIAGNOSIS — G709 Myoneural disorder, unspecified: Secondary | ICD-10-CM | POA: Insufficient documentation

## 2023-12-29 DIAGNOSIS — J449 Chronic obstructive pulmonary disease, unspecified: Secondary | ICD-10-CM | POA: Insufficient documentation

## 2023-12-29 DIAGNOSIS — D125 Benign neoplasm of sigmoid colon: Secondary | ICD-10-CM | POA: Insufficient documentation

## 2023-12-29 DIAGNOSIS — K635 Polyp of colon: Secondary | ICD-10-CM | POA: Diagnosis not present

## 2023-12-29 DIAGNOSIS — K648 Other hemorrhoids: Secondary | ICD-10-CM

## 2023-12-29 DIAGNOSIS — D123 Benign neoplasm of transverse colon: Secondary | ICD-10-CM | POA: Insufficient documentation

## 2023-12-29 DIAGNOSIS — K219 Gastro-esophageal reflux disease without esophagitis: Secondary | ICD-10-CM | POA: Diagnosis not present

## 2023-12-29 DIAGNOSIS — Z860100 Personal history of colon polyps, unspecified: Secondary | ICD-10-CM

## 2023-12-29 HISTORY — PX: COLONOSCOPY: SHX5424

## 2023-12-29 LAB — HM COLONOSCOPY

## 2023-12-29 SURGERY — COLONOSCOPY
Anesthesia: General

## 2023-12-29 MED ORDER — PROPOFOL 500 MG/50ML IV EMUL
INTRAVENOUS | Status: AC
  Filled 2023-12-29: qty 50

## 2023-12-29 MED ORDER — LACTATED RINGERS IV SOLN: INTRAVENOUS | Status: DC

## 2023-12-29 MED ORDER — PHENYLEPHRINE 80 MCG/ML (10ML) SYRINGE FOR IV PUSH (FOR BLOOD PRESSURE SUPPORT)
PREFILLED_SYRINGE | INTRAVENOUS | Status: DC | PRN
  Administered 2023-12-29: 80 ug via INTRAVENOUS

## 2023-12-29 MED ORDER — PROPOFOL 500 MG/50ML IV EMUL
INTRAVENOUS | Status: DC | PRN
  Administered 2023-12-29: 150 ug/kg/min via INTRAVENOUS
  Administered 2023-12-29: 60 mg via INTRAVENOUS

## 2023-12-29 NOTE — Discharge Instructions (Signed)
 You are being discharged to home.  Resume your previous diet.  We are waiting for your pathology results.  Your physician has recommended a repeat colonoscopy in three years for surveillance.

## 2023-12-29 NOTE — Op Note (Signed)
 [facility] Patient Name: Rachel [name] Procedure Date: 12/29/2023 1:46 PM MRN: [id number] Date of Birth: Jul 06, 1945 Attending MD: Toribio Fortune , , [id number] CSN: [id number] Age: 78 Admit Type: Outpatient Procedure:                Colonoscopy Indications:              Surveillance: History of numerous (> 10) adenomas                            on last colonoscopy (< 3 yrs) Providers:                Toribio Fortune, Olam Ada, RN, Dorcas Lenis,                            Technician Referring MD:              Medicines:                Monitored Anesthesia Care Complications:            No immediate complications. Estimated Blood Loss:     Estimated blood loss: none. Procedure:                Pre-Anesthesia Assessment:                           - Prior to the procedure, a History and Physical                            was performed, and patient medications, allergies                            and sensitivities were reviewed. The patient's                            tolerance of previous anesthesia was reviewed.                           - The risks and benefits of the procedure and the                            sedation options and risks were discussed with the                            patient. All questions were answered and informed                            consent was obtained.                           - ASA Grade Assessment: II - A patient with mild                            systemic disease.                           After obtaining informed consent, the colonoscope  was passed under direct vision. Throughout the                            procedure, the patient's blood pressure, pulse, and                            oxygen saturations were monitored continuously. The                            [id number]-HQ190L ([id number]) scope was introduced through                            the anus and advanced to the the cecum, identified                             by appendiceal orifice and ileocecal valve. The                            colonoscopy was performed without difficulty. The                            patient tolerated the procedure well. The quality                            of the bowel preparation was good. Scope In: 2:00:23 PM Scope Out: 2:21:18 PM Scope Withdrawal Time: 0 hours 12 minutes 55 seconds  Total Procedure Duration: 0 hours 20 minutes 55 seconds  Findings:      The perianal and digital rectal examinations were normal.      Six sessile polyps were found in the sigmoid colon, transverse colon,       ascending colon and cecum. The polyps were 2 to 8 mm in size. These       polyps were removed with a cold snare. Resection and retrieval were       complete.      Scattered small-mouthed diverticula were found in the sigmoid colon and       descending colon.      Non-bleeding internal hemorrhoids were found during retroflexion. The       hemorrhoids were small. Impression:               - Six 2 to 8 mm polyps in the sigmoid colon, in the                            transverse colon, in the ascending colon and in the                            cecum, removed with a cold snare. Resected and                            retrieved.                           - Diverticulosis in the sigmoid colon and in the  descending colon.                           - Non-bleeding internal hemorrhoids. Moderate Sedation:      Per Anesthesia Care Recommendation:           - Discharge patient to home (ambulatory).                           - Resume previous diet.                           - Await pathology results.                           - Repeat colonoscopy in 3 years for surveillance. Procedure Code(s):        --- Professional ---                           [phone], Colonoscopy, flexible; with removal of                            tumor(s), polyp(s), or other lesion(s) by snare                             technique Diagnosis Code(s):        --- Professional ---                           Z86.010, Personal history of colonic polyps                           D12.5, Benign neoplasm of sigmoid colon                           D12.3, Benign neoplasm of transverse colon (hepatic                            flexure or splenic flexure)                           D12.2, Benign neoplasm of ascending colon                           D12.0, Benign neoplasm of cecum                           K64.8, Other hemorrhoids                           K57.30, Diverticulosis of large intestine without                            perforation or abscess without bleeding CPT copyright 2022 American Medical Association. All rights reserved. The codes documented in this report are preliminary and upon coder review may  be revised to meet current compliance requirements. Toribio Fortune, MD Toribio Fortune,  12/29/2023 2:28:26 PM This report has been signed  electronically. Number of Addenda: 0

## 2023-12-29 NOTE — Anesthesia Postprocedure Evaluation (Signed)
 Anesthesia Post Note  Patient: Rachel [name]  Procedure(s) Performed: COLONOSCOPY  Patient location during evaluation: Phase II Anesthesia Type: General Level of consciousness: awake Pain management: pain level controlled Vital Signs Assessment: post-procedure vital signs reviewed and stable Respiratory status: spontaneous breathing and respiratory function stable Cardiovascular status: blood pressure returned to baseline and stable Postop Assessment: no headache and no apparent nausea or vomiting Anesthetic complications: no Comments: Late entry   No notable events documented.   Last Vitals:  Vitals:   12/29/23 1327 12/29/23 1428  BP: (!) 160/93 102/76  Pulse: 70 64  Resp: 12 20  Temp: 36.8 C 36.5 C  SpO2: 100% 99%    Last Pain:  Vitals:   12/29/23 1428  TempSrc: Oral  PainSc: 0-No pain                 Yvonna JINNY Bosworth
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
## 2023-12-29 NOTE — Anesthesia Preprocedure Evaluation (Signed)
 Anesthesia Evaluation  Patient identified by MRN, date of birth, ID band Patient awake    Reviewed: Allergy & Precautions, H&P , NPO status , Patient's Chart, lab work & pertinent test results, reviewed documented beta blocker date and time   History of Anesthesia Complications (+) PONV and history of anesthetic complications  Airway Mallampati: II  TM Distance: >3 FB Neck ROM: full    Dental no notable dental hx.    Pulmonary COPD, former smoker   Pulmonary exam normal breath sounds clear to auscultation       Cardiovascular Exercise Tolerance: Good hypertension,  Rhythm:regular Rate:Normal     Neuro/Psych   Anxiety      Neuromuscular disease  negative psych ROS   GI/Hepatic Neg liver ROS,GERD  ,,  Endo/Other  negative endocrine ROS    Renal/GU Renal disease  negative genitourinary   Musculoskeletal   Abdominal   Peds  Hematology negative hematology ROS (+)   Anesthesia Other Findings   Reproductive/Obstetrics negative OB ROS                              Anesthesia Physical Anesthesia Plan  ASA: 2  Anesthesia Plan: General   Post-op Pain Management:    Induction:   PONV Risk Score and Plan: Propofol  infusion  Airway Management Planned:   Additional Equipment:   Intra-op Plan:   Post-operative Plan:   Informed Consent: I have reviewed the patients History and Physical, chart, labs and discussed the procedure including the risks, benefits and alternatives for the proposed anesthesia with the patient or authorized representative who has indicated his/her understanding and acceptance.     Dental Advisory Given  Plan Discussed with: CRNA  Anesthesia Plan Comments:         Anesthesia Quick Evaluation

## 2023-12-29 NOTE — Transfer of Care (Signed)
 Immediate Anesthesia Transfer of Care Note  Patient: Rachel [name]  Procedure(s) Performed: COLONOSCOPY  Patient Location: Endoscopy Unit  Anesthesia Type:General  Level of Consciousness: awake, alert , and oriented  Airway & Oxygen Therapy: Patient Spontanous Breathing  Post-op Assessment: Report given to RN, Post -op Vital signs reviewed and stable, Patient moving all extremities X 4, and Patient able to stick tongue midline  Post vital signs: Reviewed and stable  Last Vitals:  Vitals Value Taken Time  BP 104/68   Temp 36.5 C 12/29/23 14:28  Pulse 64 12/29/23 14:28  Resp 20 12/29/23 14:28  SpO2 99 % 12/29/23 14:28    Last Pain:  Vitals:   12/29/23 1428  TempSrc: Oral  PainSc: 0-No pain      Patients Stated Pain Goal: 6 (12/29/23 1312)  Complications: No notable events documented.

## 2024-01-01 ENCOUNTER — Encounter (INDEPENDENT_AMBULATORY_CARE_PROVIDER_SITE_OTHER): Payer: Self-pay | Admitting: *Deleted

## 2024-01-02 ENCOUNTER — Ambulatory Visit: Payer: Self-pay | Admitting: Gastroenterology

## 2024-01-02 ENCOUNTER — Encounter (HOSPITAL_COMMUNITY): Payer: Self-pay | Admitting: Gastroenterology

## 2024-01-02 LAB — SURGICAL PATHOLOGY

## 2024-01-03 ENCOUNTER — Encounter (INDEPENDENT_AMBULATORY_CARE_PROVIDER_SITE_OTHER): Payer: Self-pay | Admitting: *Deleted

## 2024-01-03 ENCOUNTER — Encounter: Admitting: Physician Assistant

## 2024-01-03 NOTE — Progress Notes (Signed)
 3 yr TCS noted in recall Patient result letter mailed Patient's PCP is on EPIC

## 2024-01-17 ENCOUNTER — Other Ambulatory Visit: Payer: Self-pay | Admitting: Family Medicine

## 2024-01-22 ENCOUNTER — Ambulatory Visit: Admitting: Orthopedic Surgery

## 2024-02-01 ENCOUNTER — Ambulatory Visit: Admitting: Family Medicine

## 2024-02-08 ENCOUNTER — Ambulatory Visit: Admitting: Orthopedic Surgery

## 2024-02-08 ENCOUNTER — Other Ambulatory Visit (INDEPENDENT_AMBULATORY_CARE_PROVIDER_SITE_OTHER): Payer: Self-pay

## 2024-02-08 DIAGNOSIS — S92354A Nondisplaced fracture of fifth metatarsal bone, right foot, initial encounter for closed fracture: Secondary | ICD-10-CM

## 2024-02-11 ENCOUNTER — Encounter: Payer: Self-pay | Admitting: Orthopedic Surgery
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
## 2024-02-28 ENCOUNTER — Encounter: Admitting: Physician Assistant

## 2024-03-06 ENCOUNTER — Ambulatory Visit: Payer: Self-pay

## 2024-03-06 NOTE — Telephone Encounter (Signed)
 Spoke with pt. She will keep her appt for December and has been added to waitlist

## 2024-03-06 NOTE — Telephone Encounter (Signed)
 FYI Only or Action Required?: Action required by provider: request for appointment.  Patient was last seen in primary care on 09/28/2023 by [name], FNP.  Called Nurse Triage reporting Headache.  Symptoms began several days ago.  Interventions attempted: Rest, hydration, or home remedies.  Symptoms are: unchanged.  Triage Disposition: See PCP When Office is Open (Within 3 Days)  Patient/caregiver understands and will follow disposition?: No, refuses disposition  Copied from CRM [id number]. Topic: Clinical - Red Word Triage >> Mar 06, 2024  1:19 PM Tiffini S wrote: [facility] that prompted transfer to Nurse Triage: Patient is having is headache on one side of the temple- have been waking up with headaches daily. Next available appointment for providers in clinic is in December. Reason for Disposition  [1] MILD-MODERATE headache AND [2] present > 3 days (72 hours) AND [3] no improvement after using Care Advice  Answer Assessment - Initial Assessment Questions Additional info:  1) No appointments at [facility] until November. Patient also needs 72 hour notice for transportation. She wants work in visit at [employer] on Monday afternoon or anytime on Tuesday. Please note she needs to give 72 hour notice during business hours they do not include Saturday or Sunday.     1. LOCATION: Where does it hurt?      Right side near temple  2. ONSET: When did the headache start? (e.g., minutes, hours, days)      Occurring each morning but subsides by mid day 3. PATTERN: Does the pain come and go, or has it been constant since it started?     intermittent 4. SEVERITY: How bad is the pain? and What does it keep you from doing?  (e.g., Scale 1-10; mild, moderate, or severe)     Mild-throbbing  5. RECURRENT SYMPTOM: Have you ever had headaches before? If Yes, ask: When was the last time? and What happened that time?      no 6. CAUSE: What do you think is  causing the headache?     unsure 7. MIGRAINE: Have you been diagnosed with migraine headaches? If Yes, ask: Is this headache similar?      denies 8. HEAD INJURY: Has there been any recent injury to your head?      Denies  9. OTHER SYMPTOMS: Do you have any other symptoms? (e.g., fever, stiff neck, eye pain, sore throat, cold symptoms)     Runny nose and congestion 10. PREGNANCY: Is there any chance you are pregnant? When was your last menstrual period?  Protocols used: [facility]

## 2024-04-04 ENCOUNTER — Encounter (INDEPENDENT_AMBULATORY_CARE_PROVIDER_SITE_OTHER): Payer: Self-pay | Admitting: Gastroenterology

## 2024-04-18 ENCOUNTER — Emergency Department (HOSPITAL_COMMUNITY)

## 2024-04-18 ENCOUNTER — Emergency Department (HOSPITAL_COMMUNITY)
Admission: EM | Admit: 2024-04-18 | Discharge: 2024-04-18 | Disposition: A | Discharge status: Home / Self Care | Attending: Emergency Medicine | Admitting: Emergency Medicine

## 2024-04-18 ENCOUNTER — Encounter (HOSPITAL_COMMUNITY): Payer: Self-pay | Admitting: Emergency Medicine

## 2024-04-18 ENCOUNTER — Other Ambulatory Visit: Payer: Self-pay

## 2024-04-18 DIAGNOSIS — M542 Cervicalgia: Secondary | ICD-10-CM | POA: Diagnosis not present

## 2024-04-18 DIAGNOSIS — R110 Nausea: Secondary | ICD-10-CM | POA: Insufficient documentation

## 2024-04-18 DIAGNOSIS — I10 Essential (primary) hypertension: Secondary | ICD-10-CM | POA: Insufficient documentation

## 2024-04-18 DIAGNOSIS — Z79899 Other long term (current) drug therapy: Secondary | ICD-10-CM | POA: Diagnosis not present

## 2024-04-18 DIAGNOSIS — R519 Headache, unspecified: Secondary | ICD-10-CM | POA: Insufficient documentation

## 2024-04-18 LAB — CBC
HCT: 42.2 % (ref 36.0–46.0)
Hemoglobin: 13.8 g/dL (ref 12.0–15.0)
MCH: 29.7 pg (ref 26.0–34.0)
MCHC: 32.7 g/dL (ref 30.0–36.0)
MCV: 90.9 fL (ref 80.0–100.0)
Platelets: 187 K/uL (ref 150–400)
RBC: 4.64 MIL/uL (ref 3.87–5.11)
RDW: 15.4 % (ref 11.5–15.5)
WBC: 6.5 K/uL (ref 4.0–10.5)
nRBC: 0 % (ref 0.0–0.2)

## 2024-04-18 LAB — BASIC METABOLIC PANEL WITH GFR
Anion gap: 14 (ref 5–15)
BUN: 9 mg/dL (ref 8–23)
CO2: 24 mmol/L (ref 22–32)
Calcium: 9.8 mg/dL (ref 8.9–10.3)
Chloride: 102 mmol/L (ref 98–111)
Creatinine, Ser: 0.79 mg/dL (ref 0.44–1.00)
GFR, Estimated: >60 mL/min (ref >60)
Glucose, Bld: 120 mg/dL — ABNORMAL HIGH (ref 70–99)
Potassium: 4 mmol/L (ref 3.5–5.1)
Sodium: 141 mmol/L (ref 135–145)

## 2024-04-18 LAB — SEDIMENTATION RATE: Sed Rate: 60 mm/h — ABNORMAL HIGH (ref 0–30)

## 2024-04-18 MED ORDER — KETOROLAC TROMETHAMINE 15 MG/ML IJ SOLN
15.0000 mg | Freq: Once | INTRAMUSCULAR | Status: DC
Start: 2024-04-18 — End: 2024-04-19

## 2024-04-18 MED ORDER — PROCHLORPERAZINE EDISYLATE 10 MG/2ML IJ SOLN
5.0000 mg | Freq: Once | INTRAMUSCULAR | Status: AC
  Administered 2024-04-18: 5 mg via INTRAVENOUS
  Filled 2024-04-18: qty 2

## 2024-04-18 MED ORDER — METHOCARBAMOL 500 MG PO TABS: 500.0000 mg | ORAL_TABLET | Freq: Three times a day (TID) | ORAL | 0 refills | Status: AC | PRN

## 2024-04-18 NOTE — ED Triage Notes (Signed)
 Pt complains of headache x 2 weeks. Worse on left side but hurts all over. Nausea today but has not eaten anything to eat. Headaches are all day long but then at night gets better and able to sleep.
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
## 2024-04-18 NOTE — Discharge Instructions (Addendum)
 There may be muscle spasm in the neck.  You have been giving a muscle laxer to help with this they can take if it is needed.  You may even start with half a pill.  Follow-up with your doctor as needed.  Your blood pressure is elevated and will need to be followed.

## 2024-05-03 ENCOUNTER — Ambulatory Visit (HOSPITAL_COMMUNITY)

## 2024-05-03 ENCOUNTER — Other Ambulatory Visit (HOSPITAL_COMMUNITY)

## 2024-05-16 ENCOUNTER — Telehealth: Admitting: Family Medicine

## 2024-05-24 ENCOUNTER — Encounter (HOSPITAL_COMMUNITY): Payer: Self-pay

## 2024-05-24 ENCOUNTER — Ambulatory Visit: Payer: Self-pay | Admitting: Family Medicine

## 2024-05-24 ENCOUNTER — Ambulatory Visit (HOSPITAL_COMMUNITY)
Admission: RE | Admit: 2024-05-24 | Discharge: 2024-05-24 | Disposition: A | Discharge status: Home / Self Care | Source: Ambulatory Visit | Attending: Family Medicine | Admitting: Family Medicine

## 2024-05-24 DIAGNOSIS — Z1231 Encounter for screening mammogram for malignant neoplasm of breast: Secondary | ICD-10-CM | POA: Diagnosis present

## 2024-05-24 DIAGNOSIS — Z780 Asymptomatic menopausal state: Secondary | ICD-10-CM | POA: Insufficient documentation

## 2024-05-24 DIAGNOSIS — M810 Age-related osteoporosis without current pathological fracture: Secondary | ICD-10-CM

## 2024-05-24 MED ORDER — ALENDRONATE SODIUM 70 MG PO TABS: 70.0000 mg | ORAL_TABLET | ORAL | 11 refills | Status: AC

## 2024-05-24 NOTE — Progress Notes (Signed)
 Please inform the patient that her dexa scan indicated that she has osteoporosis.  We will repeat screening  every 2 years.   I have started treatment for osteoporosis to decrease the risk of fractures and increase bone mass. The prescription for fosamax 70 mg weekly is sent to her pharmacy.   Please advise the patient to take the medication first thing in the morning and wait 30 minutes before the first food, stay upright (not to lie down) after taking fosamax for 30 minutes and until after first food of the day (to reduce esophageal irritation). Do not the mediation with other beverage (except plain water), or other medication(s) of the day.     I recommend increasing her physical activities and taking OTC calcium supplements of 1000 mg daily and vitamin D 800iu.

## 2024-06-05 ENCOUNTER — Encounter: Payer: Self-pay | Admitting: Nurse Practitioner

## 2024-06-05 ENCOUNTER — Telehealth: Payer: Self-pay

## 2024-06-05 ENCOUNTER — Telehealth: Admitting: Nurse Practitioner

## 2024-06-05 DIAGNOSIS — N1831 Chronic kidney disease, stage 3a: Secondary | ICD-10-CM | POA: Diagnosis not present

## 2024-06-05 DIAGNOSIS — K219 Gastro-esophageal reflux disease without esophagitis: Secondary | ICD-10-CM | POA: Diagnosis not present

## 2024-06-05 DIAGNOSIS — M816 Localized osteoporosis [Lequesne]: Secondary | ICD-10-CM

## 2024-06-05 DIAGNOSIS — J432 Centrilobular emphysema: Secondary | ICD-10-CM | POA: Diagnosis not present

## 2024-06-05 DIAGNOSIS — E559 Vitamin D deficiency, unspecified: Secondary | ICD-10-CM

## 2024-06-05 DIAGNOSIS — I129 Hypertensive chronic kidney disease with stage 1 through stage 4 chronic kidney disease, or unspecified chronic kidney disease: Secondary | ICD-10-CM | POA: Diagnosis not present

## 2024-06-05 DIAGNOSIS — E782 Mixed hyperlipidemia: Secondary | ICD-10-CM | POA: Diagnosis not present

## 2024-06-05 DIAGNOSIS — E663 Overweight: Secondary | ICD-10-CM | POA: Diagnosis not present

## 2024-06-05 DIAGNOSIS — F419 Anxiety disorder, unspecified: Secondary | ICD-10-CM | POA: Diagnosis not present

## 2024-06-05 DIAGNOSIS — I7121 Aneurysm of the ascending aorta, without rupture: Secondary | ICD-10-CM | POA: Diagnosis not present

## 2024-06-05 NOTE — Telephone Encounter (Signed)
 Copied from CRM #[id number]. Topic: Clinical - Medication Question >> Jun 05, 2024  1:07 PM Delon T wrote: Reason for CRM: have questions about B12, wants to know which one is best to take- [phone]

## 2024-06-05 NOTE — Progress Notes (Signed)
 "   Virtual Visit Consent   Atonya K Mulhearn, you are scheduled for a virtual visit with Mary-Margaret Gladis, FNP, a [facility] provider, today.     Just as with appointments in the office, your consent must be obtained to participate.  Your consent will be active for this visit and any virtual visit you may have with one of our providers in the next 365 days.     If you have a MyChart account, a copy of this consent can be sent to you electronically.  All virtual visits are billed to your insurance company just like a traditional visit in the office.    As this is a virtual visit, video technology does not allow for your provider to perform a traditional examination.  This may limit your provider's ability to fully assess your condition.  If your provider identifies any concerns that need to be evaluated in person or the need to arrange testing (such as labs, EKG, etc.), we will make arrangements to do so.     Although advances in technology are sophisticated, we cannot ensure that it will always work on either your end or our end.  If the connection with a video visit is poor, the visit may have to be switched to a telephone visit.  With either a video or telephone visit, we are not always able to ensure that we have a secure connection.     I need to obtain your verbal consent now.   Are you willing to proceed with your visit today? YES   Marcea K Feher has provided verbal consent on 06/05/2024 for a virtual visit (video or telephone).   Mary-Margaret Gladis, FNP   Date: 06/05/2024 9:55 AM   Virtual Visit via Video Note   I, Mary-Margaret Gladis, connected with MAHLI GLAHN ([id number], [birth year]) on 06/05/2024 at  2:40 PM EST by a video-enabled telemedicine application and verified that I am speaking with the correct person using two identifiers.  Location: Patient: Virtual Visit Location Patient: Home Provider: Virtual Visit Location Provider: Mobile   I discussed the limitations of  evaluation and management by telemedicine and the availability of in person appointments. The patient expressed understanding and agreed to proceed.    History of Present Illness: MINNETTA SANDORA is a 79 y.o. who identifies as a female who was assigned female at birth, and is being seen today for chronic follow up.  HPI:   Chief Complaint: medical management of chronic issues     HPI:  JAREN KEARN is a 79 y.o. who identifies as a female who was assigned female at birth.   Lost video connection during appointment- had to complete visit on phone Follow up of the following chronic medical issues:  1. Essential hypertension, benign No c/o chest pain, sob or headache. Does not check blood pressure at home. Averaging in 120's systolic BP Readings from Last 3 Encounters:  04/18/24 (!) 148/87  12/29/23 102/76  11/23/23 (!) 147/101     2. Aneurysm of ascending aorta without rupture Last scan was done 2021 but was of abdominal aorta. Had myocar of heart 08/04/21 which was normal. Chest CT was done 09/08/23 which showed stable aorta measuring 4.4cm and recommended annual scanning. Will be due in April of 2026.  3. Mixed hyperlipidemia Tries to watch diet. Does very little exercise Lab Results  Component Value Date   CHOL 198 09/28/2023   HDL 100 09/28/2023   LDLCALC 76 09/28/2023   TRIG 135  09/28/2023   CHOLHDL 2.0 09/28/2023     4. Centrilobular emphysema (HCC) Has albuterol  inhaler but has not had need to use them.  5. Gastroesophageal reflux disease, unspecified whether esophagitis present Is on protonix  and is doing well.  6. Chronic kidney disease, stage 3a (HCC) No voiding issues Lab Results  Component Value Date   CREATININE 0.79 04/18/2024     7. Anxiety Is currently on no anxiety meds    06/05/2024   12:47 PM 09/28/2023    3:09 PM 02/06/2023    3:20 PM 02/06/2023    3:01 PM  GAD 7 : Generalized Anxiety Score  Nervous, Anxious, on Edge 2 0 1 0  Control/stop  worrying 0 0 0 0  Worry too much - different things 0 0 0 0  Trouble relaxing 0 0 1 0  Restless 0 0 0 0  Easily annoyed or irritable 0 0 0 0  Afraid - awful might happen 1 0 2 0  Total GAD 7 Score 3 0 4 0  Anxiety Difficulty Not difficult at all Not difficult at all Somewhat difficult Not difficult at all        06/05/2024   12:47 PM 09/28/2023    3:09 PM 09/11/2023    3:37 PM  Depression screen PHQ 2/9  Decreased Interest 0 0 0  Down, Depressed, Hopeless 0 0 0  PHQ - 2 Score 0 0 0  Altered sleeping  0 0  Tired, decreased energy  0 0  Change in appetite  0 0  Feeling bad or failure about yourself   0 0  Trouble concentrating  0 0  Moving slowly or fidgety/restless  0 0  Suicidal thoughts  0 0  PHQ-9 Score  0  0   Difficult doing work/chores  Not difficult at all Not difficult at all     Data saved with a previous flowsheet row definition    8. Vitamin D  deficiency Is on daily vitamin d  supplement  9. Overweight (BMI 25.0-29.9) Unchanged- weighed 148-150 Wt Readings from Last 3 Encounters:  04/18/24 150 lb (68 kg)  12/29/23 150 lb (68 kg)  11/04/23 149 lb (67.6 kg)   BMI Readings from Last 3 Encounters:  04/18/24 25.75 kg/m  12/29/23 25.35 kg/m  11/04/23 25.18 kg/m   10. Ostorporosis She was recently started on fosamax  and is doing well on it.  New complaints: None today  Allergies[1] Outpatient Encounter Medications as of 06/05/2024  Medication Sig   alendronate  (FOSAMAX ) 70 MG tablet Take 1 tablet (70 mg total) by mouth every 7 (seven) days. Take with a full glass of water on an empty stomach.   amLODipine  (NORVASC ) 5 MG tablet TAKE 1 TABLET BY MOUTH DAILY   Ascorbic Acid (VITAMIN C) 100 MG tablet Take 100 mg by mouth daily.   atorvastatin  (LIPITOR) 10 MG tablet TAKE 1 TABLET BY MOUTH DAILY   cholecalciferol (VITAMIN D3) 25 MCG (1000 UNIT) tablet Take 1,000 Units by mouth daily.   lisinopril  (ZESTRIL ) 10 MG tablet TAKE 1 TABLET BY MOUTH DAILY   methocarbamol   (ROBAXIN ) 500 MG tablet Take 1 tablet (500 mg total) by mouth every 8 (eight) hours as needed.   Multiple Vitamin (MULTIVITAMIN) tablet Take 1 tablet by mouth daily.   pantoprazole  (PROTONIX ) 40 MG tablet TAKE 1 TABLET BY MOUTH DAILY   propranolol  ER (INDERAL  LA) 60 MG 24 hr capsule TAKE 1 CAPSULE BY MOUTH DAILY  FOR BLOOD PRESSURE   psyllium (REGULOID) 0.52 g capsule  Take 0.52 g by mouth daily.   No facility-administered encounter medications on file as of 06/05/2024.    Past Surgical History:  Procedure Laterality Date   ABDOMINAL HYSTERECTOMY  2006   BREAST CYST EXCISION     right   BUNIONECTOMY     left   CARPAL TUNNEL RELEASE Right    COLONOSCOPY     COLONOSCOPY N/A 12/29/2023   Procedure: COLONOSCOPY;  Surgeon: Eartha Angelia Sieving, MD;  [facility]: [facility];  Service: Gastroenterology;  Laterality: N/A;  2:00 pm, asa 1-2   COLONOSCOPY WITH PROPOFOL  N/A 06/08/2021   Procedure: COLONOSCOPY WITH PROPOFOL ;  Surgeon: Eartha Angelia Sieving, MD;  Location: [facility];  Service: Gastroenterology;  Laterality: N/A;  10:35   ESOPHAGOGASTRODUODENOSCOPY  06/13/2012   Procedure: ESOPHAGOGASTRODUODENOSCOPY (EGD);  Surgeon: Claudis RAYMOND Rivet, MD;  Location: [facility];  Service: Endoscopy;  Laterality: N/A;  200   EYE SURGERY Bilateral 2020   POLYPECTOMY  06/08/2021   Procedure: POLYPECTOMY;  Surgeon: Eartha Angelia Sieving, MD;  Location: [facility];  Service: Gastroenterology;;   TRIGGER FINGER RELEASE Right 08/25/2016   Procedure: RELEASE TRIGGER FINGER/A-1 PULLEY RIGHT LONG TRIGGER FINGER RELEASE;  Surgeon: Taft FORBES Minerva, MD;  Location: [facility];  Service: Orthopedics;  Laterality: Right;    Family History  Problem Relation Age of Onset   Diabetes Father    Cancer Father        prostate cancer   Breast cancer Sister    Cancer Sister        breast   Heart disease Sister        has a mechanical heart   Eczema Daughter    Other Daughter        stomach issues    Cancer Maternal Grandmother    Hypertension Son    Colon cancer Neg Hx    Lung cancer Neg Hx       Controlled substance contract: n/a   Review of Systems  Constitutional:  Negative for diaphoresis and weight loss.  Eyes:  Negative for blurred vision, double vision and pain.  Respiratory:  Negative for shortness of breath.   Cardiovascular:  Negative for chest pain, palpitations, orthopnea and leg swelling.  Gastrointestinal:  Negative for abdominal pain.  Skin:  Negative for rash.  Neurological:  Negative for dizziness, sensory change, loss of consciousness, weakness and headaches.  Endo/Heme/Allergies:  Negative for polydipsia. Does not bruise/bleed easily.  Psychiatric/Behavioral:  Negative for memory loss. The patient does not have insomnia.   All other systems reviewed and are negative.   Problems:  Patient Active Problem List   Diagnosis Date Noted   History of colonic polyps 12/29/2023   Chronic pain in right foot 10/05/2022   Vaginitis 06/06/2022   Nocturnal leg cramps 03/04/2022   BV (bacterial vaginosis) 03/04/2022   Need for immunization against influenza 03/04/2022   Need for home health care 12/21/2021   Other fatigue 11/12/2021   Chronic pain of right ankle 07/15/2021   Ascending aortic aneurysm 07/15/2021   Vapes nicotine containing substance 07/15/2021   Overweight (BMI 25.0-29.9) 07/15/2021   S/P hysterectomy 06/17/2021   Encounter for screening fecal occult blood testing 06/17/2021   Encounter for well woman exam with routine gynecological exam 06/17/2021   Vaginal atrophy 06/17/2021   Centrilobular emphysema (HCC) 06/16/2021   Atypical chest pain 06/16/2021   Bilateral lower extremity edema 06/16/2021   Chronic kidney disease, stage 3a (HCC) 12/11/2020   Calcaneus fracture 10/17/2019   Hyperlipidemia  07/31/2019   S/P lumbar fusion 02/21/2019   Spinal stenosis of lumbar region 02/13/2019   DDD (degenerative disc disease), lumbar 07/06/2018    Osteoporosis 04/24/2018   Encounter for gynecological examination with Papanicolaou smear of cervix 09/08/2015   Abnormal Pap smear of vagina 10/15/2014   Acquired trigger finger 09/03/2014   Constipation 04/04/2014   VAIN (vaginal intraepithelial neoplasia) 06/20/2013   Knee pain, right 06/14/2013   Allergic rhinitis 08/22/2012   Neuropathy, arm 02/19/2012   GERD (gastroesophageal reflux disease) 02/19/2012   Alopecia 02/19/2012   Anxiety 10/03/2011   Tremor 10/03/2011   History of tobacco abuse 10/03/2011   OA (osteoarthritis) 08/11/2011   Cancer (HCC) 08/11/2011   Essential hypertension, benign 08/09/2011   Vitamin D  deficiency 08/09/2011    Allergies: Allergies[2] Medications: Current Medications[3]  Observations/Objective: Patient is well-developed, well-nourished in no acute distress.  Resting comfortably  at home.  Head is normocephalic, atraumatic.  No labored breathing.  Speech is clear and coherent with logical content.  Patient is alert and oriented at baseline.  No cough or SOB noted  Assessment and Plan:  SAKARI RAISANEN comes in today with chief complaint of No chief complaint on file.   Diagnosis and orders addressed:  1. Essential hypertension, benign (Primary) Low salt diet  2. Aneurysm of ascending aorta without rupture Will repeat CT scan after April 2026  3. Mixed hyperlipidemia Low fat diet and exercise  4. Centrilobular emphysema (HCC) Report any wheezing or SOB  5. Gastroesophageal reflux disease, unspecified whether esophagitis present Avoid spicy foods Do not eat 2 hours prior to bedtime   6. Chronic kidney disease, stage 3a (HCC) Labs reviewed from November  7. Anxiety Stress management  8. Vitamin D  deficiency Continue vitamin d  supplement  9. Overweight (BMI 25.0-29.9) Discussed diet and exercise for person with BMI >25 Will recheck weight in 3-6 months   10. Localized osteoporosis without current pathological  fracture Continue fosamax  Weight bearing exercise   Labs pending Health Maintenance reviewed Diet and exercise encouraged  Follow up plan: 6 months   Mary-Margaret Gladis, FNP   Follow Up Instructions: I discussed the assessment and treatment plan with the patient. The patient was provided an opportunity to ask questions and all were answered. The patient agreed with the plan and demonstrated an understanding of the instructions.  A copy of instructions were sent to the patient via MyChart.  The patient was advised to call back or seek an in-person evaluation if the symptoms worsen or if the condition fails to improve as anticipated.  Time:  I spent 32 minutes with the patient via telehealth technology discussing the above problems/concerns.    Mary-Margaret Gladis, FNP     [1] No Known Allergies [2] No Known Allergies [3]  Current Outpatient Medications:    alendronate  (FOSAMAX ) 70 MG tablet, Take 1 tablet (70 mg total) by mouth every 7 (seven) days. Take with a full glass of water on an empty stomach., Disp: 4 tablet, Rfl: 11   amLODipine  (NORVASC ) 5 MG tablet, TAKE 1 TABLET BY MOUTH DAILY, Disp: 100 tablet, Rfl: 2   Ascorbic Acid (VITAMIN C) 100 MG tablet, Take 100 mg by mouth daily., Disp: , Rfl:    atorvastatin  (LIPITOR) 10 MG tablet, TAKE 1 TABLET BY MOUTH DAILY, Disp: 100 tablet, Rfl: 2   cholecalciferol (VITAMIN D3) 25 MCG (1000 UNIT) tablet, Take 1,000 Units by mouth daily., Disp: , Rfl:    lisinopril  (ZESTRIL ) 10 MG tablet, TAKE 1 TABLET BY MOUTH  DAILY, Disp: 100 tablet, Rfl: 2   methocarbamol  (ROBAXIN ) 500 MG tablet, Take 1 tablet (500 mg total) by mouth every 8 (eight) hours as needed., Disp: 8 tablet, Rfl: 0   Multiple Vitamin (MULTIVITAMIN) tablet, Take 1 tablet by mouth daily., Disp: , Rfl:    pantoprazole  (PROTONIX ) 40 MG tablet, TAKE 1 TABLET BY MOUTH DAILY, Disp: 100 tablet, Rfl: 2   propranolol  ER (INDERAL  LA) 60 MG 24 hr capsule, TAKE 1 CAPSULE BY MOUTH  DAILY  FOR BLOOD PRESSURE, Disp: 100 capsule, Rfl: 2   psyllium (REGULOID) 0.52 g capsule, Take 0.52 g by mouth daily., Disp: , Rfl:   "

## 2024-06-06 ENCOUNTER — Other Ambulatory Visit: Payer: Self-pay | Admitting: Family Medicine

## 2024-06-06 DIAGNOSIS — E538 Deficiency of other specified B group vitamins: Secondary | ICD-10-CM

## 2024-06-07 NOTE — Telephone Encounter (Signed)
 Left voice mail

## 2024-09-11 ENCOUNTER — Ambulatory Visit

## 2024-11-06 ENCOUNTER — Ambulatory Visit: Payer: Self-pay | Admitting: Nurse Practitioner
